# Patient Record
Sex: Male | Born: 1950 | Hispanic: No | Marital: Single | Smoking: Former smoker
Health system: Southern US, Community
[De-identification: ages and names within clinical notes are randomized; demographics above are authoritative.]

## PROBLEM LIST (undated history)

## (undated) DIAGNOSIS — E785 Hyperlipidemia, unspecified: Secondary | ICD-10-CM

## (undated) DIAGNOSIS — N200 Calculus of kidney: Secondary | ICD-10-CM

## (undated) DIAGNOSIS — K7460 Unspecified cirrhosis of liver: Secondary | ICD-10-CM

## (undated) DIAGNOSIS — H269 Unspecified cataract: Secondary | ICD-10-CM

## (undated) DIAGNOSIS — R000 Tachycardia, unspecified: Secondary | ICD-10-CM

## (undated) DIAGNOSIS — R9431 Abnormal electrocardiogram [ECG] [EKG]: Secondary | ICD-10-CM

## (undated) DIAGNOSIS — B1910 Unspecified viral hepatitis B without hepatic coma: Secondary | ICD-10-CM

## (undated) DIAGNOSIS — R42 Dizziness and giddiness: Secondary | ICD-10-CM

## (undated) DIAGNOSIS — E119 Type 2 diabetes mellitus without complications: Secondary | ICD-10-CM

## (undated) DIAGNOSIS — F419 Anxiety disorder, unspecified: Secondary | ICD-10-CM

## (undated) DIAGNOSIS — R079 Chest pain, unspecified: Secondary | ICD-10-CM

## (undated) DIAGNOSIS — G709 Myoneural disorder, unspecified: Secondary | ICD-10-CM

## (undated) DIAGNOSIS — F32A Depression, unspecified: Secondary | ICD-10-CM

## (undated) DIAGNOSIS — I10 Essential (primary) hypertension: Secondary | ICD-10-CM

## (undated) HISTORY — DX: Anxiety disorder, unspecified: F41.9

## (undated) HISTORY — DX: Hyperlipidemia, unspecified: E78.5

## (undated) HISTORY — PX: CYSTOSCOPY: SUR368

## (undated) HISTORY — DX: Tachycardia, unspecified: R00.0

## (undated) HISTORY — DX: Chest pain, unspecified: R07.9

## (undated) HISTORY — DX: Essential (primary) hypertension: I10

## (undated) HISTORY — PX: CYSTOSCOPY W/ URETERAL STENT PLACEMENT: SHX1429

## (undated) HISTORY — PX: HERNIA REPAIR: SHX51

## (undated) HISTORY — DX: Type 2 diabetes mellitus without complications: E11.9

## (undated) HISTORY — DX: Unspecified cataract: H26.9

## (undated) HISTORY — DX: Dizziness and giddiness: R42

## (undated) HISTORY — PX: FRACTURE SURGERY: SHX138

## (undated) HISTORY — DX: Calculus of kidney: N20.0

## (undated) HISTORY — DX: Myoneural disorder, unspecified: G70.9

## (undated) HISTORY — DX: Abnormal electrocardiogram (ECG) (EKG): R94.31

## (undated) HISTORY — DX: Depression, unspecified: F32.A

---

## 1998-12-18 ENCOUNTER — Encounter: Payer: Self-pay | Admitting: Emergency Medicine

## 1998-12-18 ENCOUNTER — Emergency Department (HOSPITAL_COMMUNITY): Admission: EM | Admit: 1998-12-18 | Discharge: 1998-12-18 | Payer: Self-pay | Admitting: *Deleted

## 2003-11-01 ENCOUNTER — Emergency Department (HOSPITAL_COMMUNITY): Admission: EM | Admit: 2003-11-01 | Discharge: 2003-11-01 | Payer: Self-pay | Admitting: Emergency Medicine

## 2003-11-09 ENCOUNTER — Ambulatory Visit (HOSPITAL_COMMUNITY): Admission: RE | Admit: 2003-11-09 | Discharge: 2003-11-09 | Payer: Self-pay | Admitting: Urology

## 2009-01-04 ENCOUNTER — Emergency Department (HOSPITAL_COMMUNITY): Admission: EM | Admit: 2009-01-04 | Discharge: 2009-01-04 | Payer: Self-pay | Admitting: Emergency Medicine

## 2009-10-27 ENCOUNTER — Emergency Department (HOSPITAL_COMMUNITY): Admission: EM | Admit: 2009-10-27 | Discharge: 2009-10-27 | Payer: Self-pay | Admitting: Emergency Medicine

## 2009-11-28 ENCOUNTER — Emergency Department (HOSPITAL_COMMUNITY): Admission: EM | Admit: 2009-11-28 | Discharge: 2009-11-28 | Payer: Self-pay | Admitting: Emergency Medicine

## 2010-03-27 LAB — GLUCOSE, CAPILLARY: Glucose-Capillary: 100 mg/dL — ABNORMAL HIGH (ref 70–99)

## 2010-03-29 LAB — URINALYSIS, ROUTINE W REFLEX MICROSCOPIC
Bilirubin Urine: NEGATIVE
Nitrite: NEGATIVE
Specific Gravity, Urine: 1.023 (ref 1.005–1.030)
Urobilinogen, UA: 1 mg/dL (ref 0.0–1.0)
pH: 7 (ref 5.0–8.0)

## 2010-04-16 LAB — RAPID URINE DRUG SCREEN, HOSP PERFORMED
Amphetamines: NOT DETECTED
Benzodiazepines: NOT DETECTED
Tetrahydrocannabinol: NOT DETECTED

## 2010-04-16 LAB — CBC
Hemoglobin: 16.3 g/dL (ref 13.0–17.0)
MCHC: 34.7 g/dL (ref 30.0–36.0)
RBC: 5.16 MIL/uL (ref 4.22–5.81)
WBC: 6.7 10*3/uL (ref 4.0–10.5)

## 2010-04-16 LAB — COMPREHENSIVE METABOLIC PANEL
ALT: 50 U/L (ref 0–53)
Alkaline Phosphatase: 52 U/L (ref 39–117)
Chloride: 105 mEq/L (ref 96–112)
Glucose, Bld: 111 mg/dL — ABNORMAL HIGH (ref 70–99)
Potassium: 3.5 mEq/L (ref 3.5–5.1)
Sodium: 140 mEq/L (ref 135–145)
Total Bilirubin: 1.3 mg/dL — ABNORMAL HIGH (ref 0.3–1.2)
Total Protein: 7.5 g/dL (ref 6.0–8.3)

## 2010-04-16 LAB — DIFFERENTIAL
Basophils Relative: 2 % — ABNORMAL HIGH (ref 0–1)
Eosinophils Absolute: 0 10*3/uL (ref 0.0–0.7)
Eosinophils Relative: 0 % (ref 0–5)
Lymphs Abs: 1.6 10*3/uL (ref 0.7–4.0)
Monocytes Relative: 7 % (ref 3–12)

## 2010-04-16 LAB — URINALYSIS, ROUTINE W REFLEX MICROSCOPIC
Bilirubin Urine: NEGATIVE
Glucose, UA: NEGATIVE mg/dL
pH: 7 (ref 5.0–8.0)

## 2010-06-04 ENCOUNTER — Encounter: Payer: Self-pay | Admitting: Cardiology

## 2010-06-05 ENCOUNTER — Encounter: Payer: Self-pay | Admitting: Cardiology

## 2010-06-05 ENCOUNTER — Ambulatory Visit (INDEPENDENT_AMBULATORY_CARE_PROVIDER_SITE_OTHER): Payer: Self-pay | Admitting: Cardiology

## 2010-06-05 DIAGNOSIS — F17200 Nicotine dependence, unspecified, uncomplicated: Secondary | ICD-10-CM

## 2010-06-05 DIAGNOSIS — R0989 Other specified symptoms and signs involving the circulatory and respiratory systems: Secondary | ICD-10-CM

## 2010-06-05 DIAGNOSIS — R000 Tachycardia, unspecified: Secondary | ICD-10-CM

## 2010-06-05 DIAGNOSIS — I10 Essential (primary) hypertension: Secondary | ICD-10-CM

## 2010-06-05 DIAGNOSIS — M79609 Pain in unspecified limb: Secondary | ICD-10-CM

## 2010-06-05 DIAGNOSIS — R0609 Other forms of dyspnea: Secondary | ICD-10-CM

## 2010-06-05 DIAGNOSIS — M79606 Pain in leg, unspecified: Secondary | ICD-10-CM

## 2010-06-05 MED ORDER — LOSARTAN POTASSIUM 50 MG PO TABS: 50.0000 mg | ORAL_TABLET | Freq: Every day | ORAL | Status: DC

## 2010-06-05 NOTE — Patient Instructions (Addendum)
Stop Lisinopril.  Start Losartan 50mg  twice a day.  Start Aspirin 81mg  daily. This should be enteric coated.  Schedule an appointment for a stress myoview. See instruction sheet.  Schedule an appointment for fasting lab in 1 week--lipid profile/liver profile/BMP 786.09  [phone]  Schedule an appointment to see Dr Shirlee Latch in 4 weeks    .

## 2010-06-06 DIAGNOSIS — IMO0001 Reserved for inherently not codable concepts without codable children: Secondary | ICD-10-CM | POA: Insufficient documentation

## 2010-06-06 DIAGNOSIS — R0609 Other forms of dyspnea: Secondary | ICD-10-CM | POA: Insufficient documentation

## 2010-06-06 DIAGNOSIS — F17200 Nicotine dependence, unspecified, uncomplicated: Secondary | ICD-10-CM | POA: Insufficient documentation

## 2010-06-06 DIAGNOSIS — R000 Tachycardia, unspecified: Secondary | ICD-10-CM | POA: Insufficient documentation

## 2010-06-06 NOTE — Assessment & Plan Note (Signed)
I strongly encouraged the patient to stop smoking.  

## 2010-06-06 NOTE — Assessment & Plan Note (Signed)
Resting HR 90s-100s, sinus.  Amitriptyline can cause tachycardia.  I think that this was started for treatment of presumed neuropathy.  I would consider using a different medication for neuropathy such as gabapentin which would be less likely to promote tachycardia.

## 2010-06-06 NOTE — Assessment & Plan Note (Addendum)
Patient's main complaint is exertional dyspnea that has been new over 7-8 months.  He does have risk factors for CAD: age, smoking, HTN.  I think CAD needs to be ruled out: will get ETT-myoview.  He should also start ASA 81 mg daily.  If the myoview is unrevealing, would consider checking PFTs given smoking history (?COPD).  I will also check lipids.

## 2010-06-06 NOTE — Assessment & Plan Note (Signed)
Good pedal pulses, doubt claudication.  Patient has history of peripheral neuropathy which might be the problem.

## 2010-06-06 NOTE — Progress Notes (Signed)
PCP: Dr. Andrey Campanile  60 yo with history of HTN presents for cardiology evaluation.  Patient feels fatigued in general.  He is short of breath and tired after walking 400-500 yards on flat ground.  This has been going on for 7-8 months.  He is also short of breath climbing a flight of steps and has to stop 1/2 way up to rest.  No chest pain/pressure/tightness.  He feels like his heart beats too fast at times.  Patient also reports lightheadedness if he stands too fast. He was not significantly orthostatic today when checked in the office.  He has had a chronic cough since starting lisinopril.  Finally, he gets some pain in his calves after walking 4-5 blocks.    ECG: NSR, nonspecific inferior T wave flattening  Labs (12/11): K 4, creatinine 0.78, HCT 45.8  PMH: 1. HTN: cough with ACEI 2. Nephrolithiasis 3. ? Peripheral neuropathy  SH: Lives in [place], originally from [place].  Now unemployed, had an Hydrologist prior.  Smokes 1 cigarette/day.  FH: No coronary artery disease.   ROS: All systems reviewed and negative except as per HPI.   Current Outpatient Prescriptions  Medication Sig Dispense Refill  . amitriptyline (ELAVIL) 25 MG tablet Take 25 mg by mouth at bedtime.        Marland Kitchen amLODipine (NORVASC) 5 MG tablet Take 5 mg by mouth daily.        . DULoxetine (CYMBALTA) 30 MG capsule Take 30 mg by mouth daily.        Marland Kitchen ibuprofen (ADVIL,MOTRIN) 200 MG tablet Take 200 mg by mouth every 6 (six) hours as needed.        . Multiple Vitamin (MULTIVITAMIN) capsule Take 1 capsule by mouth daily.        Marland Kitchen aspirin EC 81 MG tablet Take 1 daily      . losartan (COZAAR) 50 MG tablet Take 1 tablet (50 mg total) by mouth daily.  60 tablet  6    BP 140/99  Pulse 102  Resp 18  Ht 5\' 9"  (1.753 m)  Wt 189 lb (85.73 kg)  BMI 27.91 kg/m2 General: NAD Neck: No JVD, no thyromegaly or thyroid nodule.  Lungs: Clear to auscultation bilaterally with normal respiratory effort. CV: Nondisplaced PMI.   Heart regular S1/S2, no S3, +S4, no murmur.  No peripheral edema.  No carotid bruit.  Normal pedal pulses.  Abdomen: Soft, nontender, no hepatosplenomegaly, no distention.  Skin: Intact without lesions or rashes.  Neurologic: Alert and oriented x 3.  Psych: Normal affect. Extremities: No clubbing or cyanosis.  HEENT: Normal.

## 2010-06-06 NOTE — Assessment & Plan Note (Signed)
BP is a bit high today.  He is not orthostatic. If BP is high at next appointment, I will increase amlodipine.

## 2010-06-14 ENCOUNTER — Other Ambulatory Visit (INDEPENDENT_AMBULATORY_CARE_PROVIDER_SITE_OTHER): Payer: Self-pay | Admitting: *Deleted

## 2010-06-14 ENCOUNTER — Ambulatory Visit (HOSPITAL_COMMUNITY): Payer: Self-pay | Attending: Cardiology | Admitting: Radiology

## 2010-06-14 DIAGNOSIS — R0989 Other specified symptoms and signs involving the circulatory and respiratory systems: Secondary | ICD-10-CM

## 2010-06-14 DIAGNOSIS — I10 Essential (primary) hypertension: Secondary | ICD-10-CM

## 2010-06-14 DIAGNOSIS — R0609 Other forms of dyspnea: Secondary | ICD-10-CM

## 2010-06-14 DIAGNOSIS — R079 Chest pain, unspecified: Secondary | ICD-10-CM

## 2010-06-14 LAB — HEPATIC FUNCTION PANEL
ALT: 59 U/L — ABNORMAL HIGH (ref 0–53)
AST: 47 U/L — ABNORMAL HIGH (ref 0–37)
Albumin: 4.3 g/dL (ref 3.5–5.2)
Total Bilirubin: 0.7 mg/dL (ref 0.3–1.2)

## 2010-06-14 LAB — LIPID PANEL
Cholesterol: 173 mg/dL (ref 0–200)
HDL: 44.5 mg/dL (ref >39.00)
Triglycerides: 67 mg/dL (ref 0.0–149.0)

## 2010-06-14 LAB — BASIC METABOLIC PANEL
Calcium: 9.6 mg/dL (ref 8.4–10.5)
GFR: 107.93 mL/min (ref >60.00)
Sodium: 145 mEq/L (ref 135–145)

## 2010-06-14 MED ORDER — TECHNETIUM TC 99M TETROFOSMIN IV KIT
10.7000 | PACK | Freq: Once | INTRAVENOUS | Status: AC | PRN
  Administered 2010-06-14: 11 via INTRAVENOUS

## 2010-06-14 MED ORDER — TECHNETIUM TC 99M TETROFOSMIN IV KIT
33.0000 | PACK | Freq: Once | INTRAVENOUS | Status: AC | PRN
  Administered 2010-06-14: 33 via INTRAVENOUS

## 2010-06-14 NOTE — Progress Notes (Addendum)
[facility] SITE 3 NUCLEAR MED [address] [place] [address] [phone]  Cardiology Nuclear Med Study  Phillip Paul is a 60 y.o. male [id number] [birth year]   Nuclear Med Background Indication for Stress Test:  Evaluation for Ischemia History:  No previous documented CAD Cardiac Risk Factors: Hypertension and Smoker  Symptoms:  Chest Pain (last date of chest discomfort 1 week ago), Dizziness, DOE, Fatigue, Fatigue with Exertion and Rapid HR   Nuclear Pre-Procedure Caffeine/Decaff Intake:  7:00pm NPO After: 7:00pm   Lungs:  Clear IV 0.9% NS with Angio Cath:  22g  IV Site: L Antecubital  IV Started by:  Irean Hong, RN  Chest Size (in):  42 Cup Size: n/a  Height: 5\' 9"  (1.753 m)  Weight:  184 lb (83.462 kg)  BMI:  Body mass index is 27.17 kg/(m^2). Tech Comments:  N/A    Nuclear Med Study 1 or 2 day study: 1 day  Stress Test Type:  Stress  Reading MD: Olga Millers, MD  Order Authorizing Provider:  Dr. Golden Circle  Resting Radionuclide: Technetium 51m Tetrofosmin  Resting Radionuclide Dose: 10.7 mCi   Stress Radionuclide:  Technetium 41m Tetrofosmin  Stress Radionuclide Dose: 33 mCi           Stress Protocol Rest HR: 91 Stress HR: 144  Rest BP: 114/83 Stress BP: 126/80  Exercise Time (min): 9:38 METS: 10.1   Predicted Max HR: 161 bpm % Max HR: 89.44 bpm Rate Pressure Product: 89211   Dose of Adenosine (mg):  n/a Dose of Lexiscan: n/a mg  Dose of Atropine (mg): n/a Dose of Dobutamine: n/a mcg/kg/min (at max HR)  Stress Test Technologist: Bonnita Levan, RN  Nuclear Technologist:  Domenic Polite, CNMT     Rest Procedure:  Myocardial perfusion imaging was performed at rest 45 minutes following the intravenous administration of Technetium 50m Tetrofosmin. Rest ECG: NSR  Stress Procedure:  The patient exercised for 9:38.  The patient stopped due to fatigue and dyspnea and denied any chest pain.  There were no significant ST-T wave  changes.  Technetium 32m Tetrofosmin was injected at peak exercise and myocardial perfusion imaging was performed after a brief delay. Stress ECG: No significant ST segment change suggestive of ischemia.  QPS Raw Data Images:  Acquisition technically good; normal left ventricular size. Stress Images:  There is decreased uptake in the inferior wall. Rest Images:  There is decreased uptake in the inferior wall, less prominent compared to the stress images. Subtraction (SDS):  These findings are consistent with inferior thinning vs prior infarct and mild inferior ischemia. Transient Ischemic Dilatation (Normal <1.22):  1.0 Lung/Heart Ratio (Normal <0.45):  .26  Quantitative Gated Spect Images QGS EDV:  80 ml QGS ESV:  28 ml QGS cine images:  Normal Wall Motion QGS EF: 65%  Impression Exercise Capacity:  Good exercise capacity. BP Response:  Normal blood pressure response. Clinical Symptoms:  No chest pain. ECG Impression:  No significant ST segment change suggestive of ischemia. Comparison with Prior Nuclear Study: No images to compare  Overall Impression:  Abnormal stress nuclear study with a moderate, partially reversible inferior defect c/w inferior thinning vs prior infarct; mild ischemia.   Olga Millers   Possible inferior ischemia.  Needs office followup.  [name]

## 2010-06-15 ENCOUNTER — Telehealth: Payer: Self-pay | Admitting: *Deleted

## 2010-06-15 NOTE — Telephone Encounter (Signed)
Called Phillip Paul to give results stress test but due to his language barrier he asked me to call a nurse friend "dottie' and give her results so she can explain to him--i tried to get dottie, but no answer--LMTCB with our #--nt

## 2010-06-15 NOTE — Progress Notes (Signed)
Copy routed to Dr.Mclean.Falecha L Clark ° °

## 2010-06-18 ENCOUNTER — Encounter: Payer: Self-pay | Admitting: Cardiology

## 2010-06-18 NOTE — Progress Notes (Signed)
Discussed with pt. He is aware he should keep appt scheduled with Dr Shirlee Latch 07/05/10.

## 2010-06-18 NOTE — Progress Notes (Signed)
appt 07/05/10/ with Dr Shirlee Latch

## 2010-07-05 ENCOUNTER — Ambulatory Visit (INDEPENDENT_AMBULATORY_CARE_PROVIDER_SITE_OTHER): Payer: Self-pay | Admitting: Cardiology

## 2010-07-05 ENCOUNTER — Encounter: Payer: Self-pay | Admitting: *Deleted

## 2010-07-05 ENCOUNTER — Encounter: Payer: Self-pay | Admitting: Cardiology

## 2010-07-05 VITALS — BP 116/78 | HR 84 | Ht 69.0 in | Wt 183.2 lb

## 2010-07-05 DIAGNOSIS — R9430 Abnormal result of cardiovascular function study, unspecified: Secondary | ICD-10-CM

## 2010-07-05 DIAGNOSIS — R0602 Shortness of breath: Secondary | ICD-10-CM

## 2010-07-05 DIAGNOSIS — F17200 Nicotine dependence, unspecified, uncomplicated: Secondary | ICD-10-CM

## 2010-07-05 DIAGNOSIS — R0609 Other forms of dyspnea: Secondary | ICD-10-CM

## 2010-07-05 DIAGNOSIS — I10 Essential (primary) hypertension: Secondary | ICD-10-CM

## 2010-07-05 DIAGNOSIS — R0989 Other specified symptoms and signs involving the circulatory and respiratory systems: Secondary | ICD-10-CM

## 2010-07-05 LAB — BASIC METABOLIC PANEL
BUN: 16 mg/dL (ref 6–23)
Chloride: 104 mEq/L (ref 96–112)
Creatinine, Ser: 0.7 mg/dL (ref 0.4–1.5)

## 2010-07-05 LAB — CBC WITH DIFFERENTIAL/PLATELET
Basophils Absolute: 0 10*3/uL (ref 0.0–0.1)
Eosinophils Absolute: 0.1 10*3/uL (ref 0.0–0.7)
Eosinophils Relative: 1.9 % (ref 0.0–5.0)
MCV: 93 fl (ref 78.0–100.0)
Monocytes Absolute: 0.4 10*3/uL (ref 0.1–1.0)
Neutrophils Relative %: 55.1 % (ref 43.0–77.0)
Platelets: 251 10*3/uL (ref 150.0–400.0)
WBC: 5 10*3/uL (ref 4.5–10.5)

## 2010-07-05 LAB — PROTIME-INR: Prothrombin Time: 11.6 s (ref 10.2–12.4)

## 2010-07-05 MED ORDER — LOSARTAN POTASSIUM 50 MG PO TABS: 50.0000 mg | ORAL_TABLET | Freq: Two times a day (BID) | ORAL | Status: DC

## 2010-07-05 NOTE — Assessment & Plan Note (Signed)
Abnormal myoview suggestive of possible inferior ischemia.  He continues to have exertional dyspnea.  Given his abnormal myoview and risk factors, I think that the next step will be cardiac catheterization.  I talked to him about the 01/998 risk of serious side effects.  We will plan on cath using radial access next week.  He will continue ASA 81 mg daily.  If cath is ok (suggesting false positive myoview perhaps from diaphragmatic attenuation), I will arrange for PFTs as COPD could certainly explain his dyspnea.

## 2010-07-05 NOTE — Progress Notes (Signed)
PCP: Dr. Andrey Campanile  60 yo with history of HTN presented initially for cardiology evaluation.  Patient has been feeling fatigued in general.  He is short of breath and tired after walking 400-500 yards on flat ground.  This has been going on for 7-8 months.  He is also short of breath climbing a flight of steps and has to stop 1/2 way up to rest.  No chest pain/pressure/tightness.  He feels like his heart beats too fast at times.  Patient also reports lightheadedness if he stands too fast. Chronic cough has resolved with switch from lisinopril to losartan.  Since last appointment, he has quit smoking.  I had him get an ETT-myoview given exertional symptoms and risk factors.  He had good exercise tolerance (9'38") but perfusion images showed a moderate, partially reversible inferior defect that could be consistent with ischemia.    Labs (12/11): K 4, creatinine 0.78, HCT 45.8 Labs (5/12): K 4.4, creatinine 0.8, LDL 115, HDL 44.5, AST 47, ALT 59  PMH: 1. HTN: cough with ACEI 2. Nephrolithiasis 3. ? Peripheral neuropathy 4. Exertional dyspnea: ETT-myoview (5/12) with 9/38" exercise, no ischemic ECG changes, moderate partially reversible inferior defect suggestive of possible ischemia with EF 65%.   SH: Lives in [place], originally from [place].  Now unemployed, had an Hydrologist prior.  Smokes 1 cigarette/day.  FH: No coronary artery disease.   ROS: All systems reviewed and negative except as per HPI.   Current Outpatient Prescriptions  Medication Sig Dispense Refill  . amitriptyline (ELAVIL) 25 MG tablet Take 25 mg by mouth at bedtime.        Marland Kitchen amLODipine (NORVASC) 5 MG tablet Take 5 mg by mouth daily.        Marland Kitchen aspirin EC 81 MG tablet Take 1 daily      . DULoxetine (CYMBALTA) 30 MG capsule Take 30 mg by mouth daily.        Marland Kitchen ibuprofen (ADVIL,MOTRIN) 200 MG tablet Take 200 mg by mouth every 6 (six) hours as needed.        Marland Kitchen losartan (COZAAR) 50 MG tablet Take 1 tablet (50 mg total) by  mouth 2 (two) times daily.  60 tablet  6  . Multiple Vitamin (MULTIVITAMIN) capsule Take 1 capsule by mouth daily.        Marland Kitchen DISCONTD: losartan (COZAAR) 50 MG tablet Take 1 tablet (50 mg total) by mouth daily.  60 tablet  6  . DISCONTD: losartan (COZAAR) 50 MG tablet Take 50 mg by mouth 2 (two) times daily.          BP 116/78  Pulse 84  Ht 5\' 9"  (1.753 m)  Wt 183 lb 4 oz (83.122 kg)  BMI 27.06 kg/m2 General: NAD Neck: No JVD, no thyromegaly or thyroid nodule.  Lungs: Clear to auscultation bilaterally with normal respiratory effort. CV: Nondisplaced PMI.  Heart regular S1/S2, no S3, +S4, no murmur.  No peripheral edema.  No carotid bruit.  Normal pedal pulses.  Abdomen: Soft, nontender, no hepatosplenomegaly, no distention.  Skin: Intact without lesions or rashes.  Neurologic: Alert and oriented x 3.  Psych: Normal affect. Extremities: No clubbing or cyanosis.  HEENT: Normal.

## 2010-07-05 NOTE — Assessment & Plan Note (Signed)
Patient has quit smoking altogether.  I encouraged him to stay off cigarettes.

## 2010-07-05 NOTE — Assessment & Plan Note (Signed)
BP at goal.  Cough resolved off lisinopril.  Check BMET on losartan.

## 2010-07-05 NOTE — Patient Instructions (Signed)
Lab today---BMP/CBC/PT  794.30  786.05  Cardiac Catheterization is scheduled for July 12, 2010. See instruction sheet.  Schedule an appointment with Dr Shirlee Latch in about 3 weeks.

## 2010-07-12 ENCOUNTER — Inpatient Hospital Stay (HOSPITAL_BASED_OUTPATIENT_CLINIC_OR_DEPARTMENT_OTHER)
Admission: RE | Admit: 2010-07-12 | Discharge: 2010-07-12 | Disposition: A | Discharge status: Hospice | Payer: Self-pay | Source: Ambulatory Visit | Attending: Cardiology | Admitting: Cardiology

## 2010-07-12 DIAGNOSIS — R9439 Abnormal result of other cardiovascular function study: Secondary | ICD-10-CM | POA: Insufficient documentation

## 2010-07-12 DIAGNOSIS — R079 Chest pain, unspecified: Secondary | ICD-10-CM | POA: Insufficient documentation

## 2010-07-12 DIAGNOSIS — I2510 Atherosclerotic heart disease of native coronary artery without angina pectoris: Secondary | ICD-10-CM

## 2010-07-26 ENCOUNTER — Encounter: Payer: Self-pay | Admitting: Cardiology

## 2010-07-26 ENCOUNTER — Ambulatory Visit (INDEPENDENT_AMBULATORY_CARE_PROVIDER_SITE_OTHER): Payer: Self-pay | Admitting: Cardiology

## 2010-07-26 DIAGNOSIS — R0609 Other forms of dyspnea: Secondary | ICD-10-CM

## 2010-07-26 DIAGNOSIS — E7800 Pure hypercholesterolemia, unspecified: Secondary | ICD-10-CM

## 2010-07-26 DIAGNOSIS — F17200 Nicotine dependence, unspecified, uncomplicated: Secondary | ICD-10-CM

## 2010-07-26 DIAGNOSIS — E785 Hyperlipidemia, unspecified: Secondary | ICD-10-CM

## 2010-07-26 DIAGNOSIS — I10 Essential (primary) hypertension: Secondary | ICD-10-CM

## 2010-07-26 MED ORDER — PRAVASTATIN SODIUM 20 MG PO TABS: 20.0000 mg | ORAL_TABLET | Freq: Every evening | ORAL | Status: DC

## 2010-07-26 NOTE — Patient Instructions (Signed)
Start Pravachol 20mg  daily in the evening.  Schedule an appointment for a pulmonary function test.  Schedule an appointment for fasting lab in 2 months--lipid profile/liver profile  786.09  272.0  You do not need to schedule a follow-up appt with Dr Shirlee Latch.

## 2010-07-27 DIAGNOSIS — E785 Hyperlipidemia, unspecified: Secondary | ICD-10-CM | POA: Insufficient documentation

## 2010-07-27 NOTE — Assessment & Plan Note (Signed)
Left heart cath showed nonobstructive CAD.  Patient still has exertional dyspnea.  It certainly may be related to smoking (COPD).  I will have him get PFTs.  He needs to stay off cigarettes.

## 2010-07-27 NOTE — Assessment & Plan Note (Signed)
Still abstinent from cigarettes.

## 2010-07-27 NOTE — Progress Notes (Signed)
PCP: Dr. Andrey Campanile  60 yo with history of HTN presented initially for cardiology evaluation.  Patient has been feeling fatigued in general.  He is short of breath and tired after walking 400-500 yards on flat ground.  This has been going on for 7-8 months.  He is also short of breath climbing a flight of steps and has to stop 1/2 way up to rest.  No chest pain/pressure/tightness.  He feels like his heart beats too fast at times.  Patient also reports lightheadedness if he stands too fast. Chronic cough has resolved with switch from lisinopril to losartan.  He quit smoking and remains off cigarettes today.   I had him get an ETT-myoview given exertional symptoms and risk factors.  He had good exercise tolerance (9'38") but perfusion images showed a moderate, partially reversible inferior defect that could be consistent with ischemia.  Left heart cath was then done, showing 30% proximal RCA stenosis and 30% mid CFX stenosis with normal EF.    Labs (12/11): K 4, creatinine 0.78, HCT 45.8 Labs (5/12): K 4.4, creatinine 0.8, LDL 115, HDL 44.5, AST 47, ALT 59  PMH: 1. HTN: cough with ACEI 2. Nephrolithiasis 3. ? Peripheral neuropathy 4. Exertional dyspnea: ETT-myoview (5/12) with 9/38" exercise, no ischemic ECG changes, moderate partially reversible inferior defect suggestive of possible ischemia with EF 65%.  LHC (6/12): 30% proximal RCA stenosis, 30% mid CFX, EF 60%.   SH: Lives in [place], originally from [place].  Now unemployed, had an Hydrologist prior.  Quit smoking 6/12.   FH: No coronary artery disease.   ROS: All systems reviewed and negative except as per HPI.   Current Outpatient Prescriptions  Medication Sig Dispense Refill  . amitriptyline (ELAVIL) 25 MG tablet Take 25 mg by mouth at bedtime.        Marland Kitchen amLODipine (NORVASC) 5 MG tablet Take 5 mg by mouth daily.        Marland Kitchen aspirin EC 81 MG tablet Take 1 daily      . DULoxetine (CYMBALTA) 30 MG capsule Take 30 mg by mouth daily.         Marland Kitchen ibuprofen (ADVIL,MOTRIN) 200 MG tablet Take 200 mg by mouth every 6 (six) hours as needed.        Marland Kitchen losartan (COZAAR) 50 MG tablet Take 1 tablet (50 mg total) by mouth 2 (two) times daily.  60 tablet  6  . Multiple Vitamin (MULTIVITAMIN) capsule Take 1 capsule by mouth daily.        . pravastatin (PRAVACHOL) 20 MG tablet Take 1 tablet (20 mg total) by mouth every evening.  30 tablet  3    BP 120/78  Pulse 87  Ht 5\' 10"  (1.778 m)  Wt 184 lb (83.462 kg)  BMI 26.40 kg/m2 General: NAD Neck: No JVD, no thyromegaly or thyroid nodule.  Lungs: Clear to auscultation bilaterally with normal respiratory effort. CV: Nondisplaced PMI.  Heart regular S1/S2, no S3, +S4, no murmur.  No peripheral edema.  No carotid bruit.  Normal pedal pulses.  Abdomen: Soft, nontender, no hepatosplenomegaly, no distention.  Neurologic: Alert and oriented x 3.  Psych: Normal affect. Extremities: No clubbing or cyanosis.

## 2010-07-27 NOTE — Assessment & Plan Note (Signed)
Given mild coronary disease, I am going to start him on a low dose of statin (pravastatin 20 mg daily) to hopefully get LDL at least < 100.  Will check lipids/LFTs in 2 months.

## 2010-08-02 NOTE — Cardiovascular Report (Signed)
  Phillip Paul, VANALSTYNE NO.:  [id number]  MEDICAL RECORD NO.:  [id number]  LOCATION:                                 FACILITY:  PHYSICIAN:  Marca Ancona, MD      DATE OF BIRTH:  [birth year]  DATE OF PROCEDURE:  07/12/2010 DATE OF DISCHARGE:                           CARDIAC CATHETERIZATION   PROCEDURES: 1. Left heart catheterization. 2. Coronary angiography 3. Left ventriculography.  INDICATIONS:  This is a 60 year old who has had episodes of chest pain, sometimes with exertion, and had an abnormal Myoview scan.  PROCEDURE NOTE:  After informed consent was obtained, the patient underwent Allen testing on his right wrist.  The Allen test was positive, signifying good collateral circulation from the ulnar artery to the radial side of the hand.  The right radial area was sterilely prepped and draped.  A 1% Lidocaine was used to locally anesthetize the right radial area.  The right renal artery was entered using modified Seldinger technique and a 5-French arterial sheath was placed.  The patient received 3 mg of intraarterial verapamil and 4000 units of IV heparin.  The right coronary was engaged using the JR-4 catheter and the left coronary artery was engaged using the JL-3.0 guide catheter.  The left ventricle was entered using angled pigtail catheter.  There were no complications.  FINDINGS: 1. Hemodynamics:  Aorta 96/64, LV 104/8. 2. Left ventriculography:  EF was estimated to be 60% with no wall     motion abnormalities in the RAO projection. 3. Right coronary artery:  The right coronary artery was dominant     vessel with 30% proximal stenosis and 30% midvessel stenosis. 4. Left main.  Left main had no significant disease. 5. Left circumflex.  Left circumflex had a 30% midvessel stenosis. 6. LAD.  The LAD system had only luminal irregularities.  IMPRESSION:  Nonobstructive coronary artery disease.  The patient's Myoview is likely a false positive.   His chest pain I suspect is noncardiac.  He does need to quit smoking.    Marca Ancona, MD    DM/MEDQ  D:  07/12/2010  T:  07/12/2010  Job:  [id number]  cc:   Georganna Skeans, MD  Electronically Signed by Marca Ancona MD on 08/02/2010 01:32:10 PM

## 2010-08-08 ENCOUNTER — Ambulatory Visit (INDEPENDENT_AMBULATORY_CARE_PROVIDER_SITE_OTHER): Payer: Self-pay | Admitting: Internal Medicine

## 2010-08-08 DIAGNOSIS — R0609 Other forms of dyspnea: Secondary | ICD-10-CM

## 2010-08-08 DIAGNOSIS — R0989 Other specified symptoms and signs involving the circulatory and respiratory systems: Secondary | ICD-10-CM

## 2010-08-08 LAB — PULMONARY FUNCTION TEST

## 2010-08-08 NOTE — Progress Notes (Signed)
PFT done today. 

## 2010-08-21 ENCOUNTER — Encounter: Payer: Self-pay | Admitting: Cardiology

## 2010-08-21 ENCOUNTER — Telehealth: Payer: Self-pay | Admitting: *Deleted

## 2010-08-21 NOTE — Telephone Encounter (Signed)
Dr Shirlee Latch reviewed PFT done 08/08/10. Normal: no evidence for COPD. Pt is aware of the results

## 2010-09-24 ENCOUNTER — Inpatient Hospital Stay (INDEPENDENT_AMBULATORY_CARE_PROVIDER_SITE_OTHER)
Admission: RE | Admit: 2010-09-24 | Discharge: 2010-09-24 | Disposition: A | Discharge status: Home / Self Care | Payer: Self-pay | Source: Ambulatory Visit | Attending: Family Medicine | Admitting: Family Medicine

## 2010-09-24 ENCOUNTER — Other Ambulatory Visit (INDEPENDENT_AMBULATORY_CARE_PROVIDER_SITE_OTHER): Payer: Self-pay | Admitting: *Deleted

## 2010-09-24 DIAGNOSIS — R42 Dizziness and giddiness: Secondary | ICD-10-CM

## 2010-09-24 DIAGNOSIS — E7800 Pure hypercholesterolemia, unspecified: Secondary | ICD-10-CM

## 2010-09-25 LAB — LIPID PANEL
HDL: 39.5 mg/dL (ref >39.00)
Total CHOL/HDL Ratio: 4
Triglycerides: 116 mg/dL (ref 0.0–149.0)
VLDL: 23.2 mg/dL (ref 0.0–40.0)

## 2010-09-25 LAB — HEPATIC FUNCTION PANEL
ALT: 74 U/L — ABNORMAL HIGH (ref 0–53)
AST: 52 U/L — ABNORMAL HIGH (ref 0–37)
Albumin: 3.9 g/dL (ref 3.5–5.2)
Total Bilirubin: 0.7 mg/dL (ref 0.3–1.2)

## 2010-09-28 ENCOUNTER — Other Ambulatory Visit: Payer: Self-pay | Admitting: *Deleted

## 2010-12-20 ENCOUNTER — Other Ambulatory Visit (HOSPITAL_COMMUNITY): Payer: Self-pay | Admitting: Family Medicine

## 2010-12-20 DIAGNOSIS — R413 Other amnesia: Secondary | ICD-10-CM

## 2010-12-20 DIAGNOSIS — R42 Dizziness and giddiness: Secondary | ICD-10-CM

## 2010-12-25 ENCOUNTER — Ambulatory Visit (HOSPITAL_COMMUNITY)
Admission: RE | Admit: 2010-12-25 | Discharge: 2010-12-25 | Disposition: A | Discharge status: Home / Self Care | Payer: Self-pay | Source: Ambulatory Visit | Attending: Family Medicine | Admitting: Family Medicine

## 2010-12-25 DIAGNOSIS — R42 Dizziness and giddiness: Secondary | ICD-10-CM | POA: Insufficient documentation

## 2010-12-25 DIAGNOSIS — R413 Other amnesia: Secondary | ICD-10-CM

## 2010-12-25 DIAGNOSIS — R51 Headache: Secondary | ICD-10-CM | POA: Insufficient documentation

## 2010-12-25 DIAGNOSIS — I679 Cerebrovascular disease, unspecified: Secondary | ICD-10-CM | POA: Insufficient documentation

## 2010-12-25 DIAGNOSIS — I10 Essential (primary) hypertension: Secondary | ICD-10-CM | POA: Insufficient documentation

## 2010-12-26 ENCOUNTER — Ambulatory Visit (HOSPITAL_COMMUNITY)
Admission: RE | Admit: 2010-12-26 | Discharge: 2010-12-26 | Disposition: A | Discharge status: Home / Self Care | Payer: Self-pay | Source: Ambulatory Visit | Attending: Family Medicine | Admitting: Family Medicine

## 2010-12-26 DIAGNOSIS — R42 Dizziness and giddiness: Secondary | ICD-10-CM | POA: Insufficient documentation

## 2010-12-26 DIAGNOSIS — R55 Syncope and collapse: Secondary | ICD-10-CM

## 2010-12-26 NOTE — Progress Notes (Signed)
*  PRELIMINARY RESULTS*  Carotid Doppler has been performed. Bilateral:  No evidence of hemodynamically significant internal carotid artery stenosis.  Vertebral artery flow is antegrade.      Farrel Demark 12/26/2010, 10:22 AM

## 2011-01-24 ENCOUNTER — Encounter: Payer: Self-pay | Admitting: Cardiology

## 2011-01-24 ENCOUNTER — Ambulatory Visit (INDEPENDENT_AMBULATORY_CARE_PROVIDER_SITE_OTHER): Payer: Self-pay | Admitting: Cardiology

## 2011-01-24 ENCOUNTER — Encounter (INDEPENDENT_AMBULATORY_CARE_PROVIDER_SITE_OTHER): Payer: Self-pay

## 2011-01-24 VITALS — BP 118/70 | HR 92 | Ht 70.0 in | Wt 183.0 lb

## 2011-01-24 DIAGNOSIS — R000 Tachycardia, unspecified: Secondary | ICD-10-CM

## 2011-01-24 DIAGNOSIS — R42 Dizziness and giddiness: Secondary | ICD-10-CM

## 2011-01-24 DIAGNOSIS — I10 Essential (primary) hypertension: Secondary | ICD-10-CM

## 2011-01-24 NOTE — Assessment & Plan Note (Signed)
BP is controlled on current regimen.  

## 2011-01-24 NOTE — Assessment & Plan Note (Signed)
Heart rate is noted to be mildly elevated occasionally.  I suspect mild sinus tachycardia.  I will get a holter monitor for 48 hours to rule out any more significant arrhythmias.

## 2011-01-24 NOTE — Patient Instructions (Signed)
Your physician recommends that you schedule a follow-up appointment as needed.  Your physician has recommended that you wear a 48 HOUR holter monitor. Holter monitors are medical devices that record the heart's electrical activity. Doctors most often use these monitors to diagnose arrhythmias. Arrhythmias are problems with the speed or rhythm of the heartbeat. The monitor is a small, portable device. You can wear one while you do your normal daily activities. This is usually used to diagnose what is causing palpitations/syncope (passing out).

## 2011-01-24 NOTE — Assessment & Plan Note (Signed)
Patient's dizziness is more of a spinning sensation than lightheadedness.  He is not orthostatic.  I suspect he has positional vertigo.  Would consider referral to ENT for evaluation/treatment.

## 2011-01-24 NOTE — Progress Notes (Signed)
PCP: [facility]  60 yo with history of HTN returns for cardiology evaluation. He had a cath with nonobstructive CAD in 6/12.  He had PFTs in 7/12 that were within normal limits.  He has stopped smoking.    He has had long-term problems with dizziness.  He has undergone a rather extensive workup for this recently with an unremarkable head CT and carotid dopplers.  He notes dizziness with positional changes such as standing from sitting or turning his head to one side.  The dizziness is more of a spinning sensation, not lightheadedness.  Also, he has noted his pulse rate going high at times, up to the 130s.  He brings a heart rate and BP log today.  Most of the time, BP is well controlled.  HR is mainly in the 80s-100s.  Today, HR is 90 (sinus rhythm).  He walks 3-4 miles a day.  He is fatigued after walking about 4 blocks but keeps going.    He is off statin now because LFTs elevated.   We checked orthostatics today.  He is not orthostatic.   ECG: NSR, normal with HR 90  Labs (12/11): K 4, creatinine 0.78, HCT 45.8 Labs (5/12): K 4.4, creatinine 0.8, LDL 115, HDL 44.5, AST 47, ALT 59 Labs (9/12): LDL 102, HDL 39.5, AST 52, ALT 74  PMH: 1. HTN: cough with ACEI 2. Nephrolithiasis 3. ? Peripheral neuropathy 4. Exertional dyspnea: ETT-myoview (5/12) with 9/38" exercise, no ischemic ECG changes, moderate partially reversible inferior defect suggestive of possible ischemia with EF 65%.  LHC (6/12): 30% proximal RCA stenosis, 30% mid CFX, EF 60%. PFTs (7/12): within normal limits.  5. "Dizziness": Sounds like vertigo. Had head CT in 12/12 and carotid dopplers in 12/12.  Both were unremarkable.    SH: Lives in [place], originally from [place].  Now unemployed, had an Hydrologist prior.  Quit smoking 6/12.   FH: No coronary artery disease.   ROS: All systems reviewed and negative except as per HPI.   Current Outpatient Prescriptions  Medication Sig Dispense Refill  . amitriptyline  (ELAVIL) 25 MG tablet Take 25 mg by mouth at bedtime.        Marland Kitchen amLODipine (NORVASC) 10 MG tablet Take 10 mg by mouth daily.      Marland Kitchen aspirin EC 81 MG tablet Take 1 daily      . DULoxetine (CYMBALTA) 30 MG capsule Take 30 mg by mouth daily.        . fish oil-omega-3 fatty acids 1000 MG capsule Take 1 g by mouth daily.      Marland Kitchen ibuprofen (ADVIL,MOTRIN) 200 MG tablet Take 200 mg by mouth every 6 (six) hours as needed.        Marland Kitchen losartan (COZAAR) 50 MG tablet Take 50 mg by mouth daily.      . Multiple Vitamin (MULTIVITAMIN) capsule Take 1 capsule by mouth daily.        Marland Kitchen DISCONTD: losartan (COZAAR) 50 MG tablet Take 1 tablet (50 mg total) by mouth 2 (two) times daily.  60 tablet  6  . DISCONTD: pravastatin (PRAVACHOL) 20 MG tablet Take 1 tablet (20 mg total) by mouth every evening.  30 tablet  3    BP 136/88  Pulse 90  Ht 5\' 10"  (1.778 m)  Wt 83.008 kg (183 lb)  BMI 26.26 kg/m2 General: NAD Neck: No JVD, no thyromegaly or thyroid nodule.  Lungs: Clear to auscultation bilaterally with normal respiratory effort. CV: Nondisplaced PMI.  Heart  regular S1/S2, no S3, +S4, no murmur.  No peripheral edema.  No carotid bruit.  Normal pedal pulses.  Abdomen: Soft, nontender, no hepatosplenomegaly, no distention.  Neurologic: Alert and oriented x 3.  Psych: Normal affect. Extremities: No clubbing or cyanosis.

## 2011-02-12 ENCOUNTER — Telehealth: Payer: Self-pay | Admitting: *Deleted

## 2011-02-12 NOTE — Telephone Encounter (Signed)
02/12/11--Dr Shirlee Latch reviewed monitor done 01/24/11 avg HR 89. NSR rare PACs, PVCs. Discussed findings with nurse that works with pt.

## 2012-08-17 ENCOUNTER — Emergency Department (HOSPITAL_COMMUNITY): Payer: No Typology Code available for payment source

## 2012-08-17 ENCOUNTER — Encounter (HOSPITAL_COMMUNITY): Payer: Self-pay | Admitting: Anesthesiology

## 2012-08-17 ENCOUNTER — Encounter (HOSPITAL_COMMUNITY): Payer: Self-pay | Admitting: Emergency Medicine

## 2012-08-17 ENCOUNTER — Inpatient Hospital Stay: Admit: 2012-08-17 | Payer: Self-pay | Admitting: Orthopedic Surgery

## 2012-08-17 ENCOUNTER — Inpatient Hospital Stay (HOSPITAL_COMMUNITY): Payer: No Typology Code available for payment source

## 2012-08-17 ENCOUNTER — Encounter (HOSPITAL_COMMUNITY)
Admission: EM | Disposition: A | Discharge status: Home / Self Care | Payer: Self-pay | Source: Home / Self Care | Attending: Family Medicine

## 2012-08-17 ENCOUNTER — Inpatient Hospital Stay (HOSPITAL_COMMUNITY)
Admission: EM | Admit: 2012-08-17 | Discharge: 2012-08-18 | DRG: 493 | Disposition: A | Discharge status: Home / Self Care | Payer: No Typology Code available for payment source | Attending: Family Medicine | Admitting: Family Medicine

## 2012-08-17 ENCOUNTER — Inpatient Hospital Stay (HOSPITAL_COMMUNITY): Payer: No Typology Code available for payment source | Admitting: Anesthesiology

## 2012-08-17 DIAGNOSIS — J40 Bronchitis, not specified as acute or chronic: Secondary | ICD-10-CM

## 2012-08-17 DIAGNOSIS — S82899A Other fracture of unspecified lower leg, initial encounter for closed fracture: Principal | ICD-10-CM

## 2012-08-17 DIAGNOSIS — S82832A Other fracture of upper and lower end of left fibula, initial encounter for closed fracture: Secondary | ICD-10-CM

## 2012-08-17 DIAGNOSIS — F17200 Nicotine dependence, unspecified, uncomplicated: Secondary | ICD-10-CM

## 2012-08-17 DIAGNOSIS — S82302A Unspecified fracture of lower end of left tibia, initial encounter for closed fracture: Secondary | ICD-10-CM

## 2012-08-17 DIAGNOSIS — E785 Hyperlipidemia, unspecified: Secondary | ICD-10-CM | POA: Diagnosis present

## 2012-08-17 DIAGNOSIS — S82839A Other fracture of upper and lower end of unspecified fibula, initial encounter for closed fracture: Secondary | ICD-10-CM

## 2012-08-17 DIAGNOSIS — S92213A Displaced fracture of cuboid bone of unspecified foot, initial encounter for closed fracture: Secondary | ICD-10-CM | POA: Diagnosis present

## 2012-08-17 DIAGNOSIS — S82209A Unspecified fracture of shaft of unspecified tibia, initial encounter for closed fracture: Secondary | ICD-10-CM | POA: Diagnosis present

## 2012-08-17 DIAGNOSIS — I2510 Atherosclerotic heart disease of native coronary artery without angina pectoris: Secondary | ICD-10-CM | POA: Diagnosis present

## 2012-08-17 DIAGNOSIS — S92212A Displaced fracture of cuboid bone of left foot, initial encounter for closed fracture: Secondary | ICD-10-CM

## 2012-08-17 DIAGNOSIS — R0902 Hypoxemia: Secondary | ICD-10-CM | POA: Diagnosis present

## 2012-08-17 DIAGNOSIS — S92309A Fracture of unspecified metatarsal bone(s), unspecified foot, initial encounter for closed fracture: Secondary | ICD-10-CM | POA: Diagnosis present

## 2012-08-17 DIAGNOSIS — S82202A Unspecified fracture of shaft of left tibia, initial encounter for closed fracture: Secondary | ICD-10-CM

## 2012-08-17 DIAGNOSIS — S82402A Unspecified fracture of shaft of left fibula, initial encounter for closed fracture: Secondary | ICD-10-CM

## 2012-08-17 DIAGNOSIS — I10 Essential (primary) hypertension: Secondary | ICD-10-CM | POA: Diagnosis present

## 2012-08-17 DIAGNOSIS — J209 Acute bronchitis, unspecified: Secondary | ICD-10-CM | POA: Diagnosis present

## 2012-08-17 HISTORY — PX: ORIF ANKLE FRACTURE: SHX5408

## 2012-08-17 HISTORY — PX: TIBIA IM NAIL INSERTION: SHX2516

## 2012-08-17 LAB — CBC WITH DIFFERENTIAL/PLATELET
Basophils Absolute: 0 10*3/uL (ref 0.0–0.1)
Basophils Relative: 0 % (ref 0–1)
Eosinophils Absolute: 0.1 10*3/uL (ref 0.0–0.7)
Eosinophils Relative: 1 % (ref 0–5)
HCT: 45.1 % (ref 39.0–52.0)
Hemoglobin: 15.6 g/dL (ref 13.0–17.0)
Lymphocytes Relative: 28 % (ref 12–46)
Lymphs Abs: 2.8 K/uL (ref 0.7–4.0)
MCH: 31 pg (ref 26.0–34.0)
MCHC: 34.6 g/dL (ref 30.0–36.0)
MCV: 89.5 fL (ref 78.0–100.0)
Monocytes Absolute: 1.5 10*3/uL — ABNORMAL HIGH (ref 0.1–1.0)
Monocytes Relative: 15 % — ABNORMAL HIGH (ref 3–12)
Neutro Abs: 5.6 10*3/uL (ref 1.7–7.7)
Neutrophils Relative %: 56 % (ref 43–77)
Platelets: 214 K/uL (ref 150–400)
RBC: 5.04 MIL/uL (ref 4.22–5.81)
RDW: 14.5 % (ref 11.5–15.5)
WBC: 10 K/uL (ref 4.0–10.5)

## 2012-08-17 LAB — BASIC METABOLIC PANEL WITH GFR
CO2: 24 meq/L (ref 19–32)
Calcium: 9.2 mg/dL (ref 8.4–10.5)
Glucose, Bld: 102 mg/dL — ABNORMAL HIGH (ref 70–99)
Potassium: 4.2 meq/L (ref 3.5–5.1)
Sodium: 134 meq/L — ABNORMAL LOW (ref 135–145)

## 2012-08-17 LAB — RAPID URINE DRUG SCREEN, HOSP PERFORMED
Barbiturates: NOT DETECTED
Opiates: POSITIVE — AB
Tetrahydrocannabinol: NOT DETECTED

## 2012-08-17 LAB — PROTIME-INR
INR: 1.02 (ref 0.00–1.49)
Prothrombin Time: 13.2 s (ref 11.6–15.2)

## 2012-08-17 LAB — BASIC METABOLIC PANEL
BUN: 10 mg/dL (ref 6–23)
Chloride: 97 mEq/L (ref 96–112)
Creatinine, Ser: 0.62 mg/dL (ref 0.50–1.35)
GFR calc Af Amer: >90 mL/min (ref >90)
GFR calc non Af Amer: >90 mL/min (ref >90)

## 2012-08-17 LAB — APTT: aPTT: 31 seconds (ref 24–37)

## 2012-08-17 SURGERY — INSERTION, INTRAMEDULLARY ROD, TIBIA
Anesthesia: General | Site: Leg Lower | Laterality: Left | Wound class: Clean

## 2012-08-17 MED ORDER — HYDROMORPHONE HCL PF 1 MG/ML IJ SOLN
INTRAMUSCULAR | Status: AC
  Filled 2012-08-17: qty 1

## 2012-08-17 MED ORDER — ALBUTEROL SULFATE (5 MG/ML) 0.5% IN NEBU
2.5000 mg | INHALATION_SOLUTION | RESPIRATORY_TRACT | Status: DC
  Administered 2012-08-18 (×4): 2.5 mg via RESPIRATORY_TRACT
  Filled 2012-08-17 (×4): qty 0.5

## 2012-08-17 MED ORDER — LACTATED RINGERS IV SOLN
INTRAVENOUS | Status: DC | PRN
  Administered 2012-08-17 (×2): via INTRAVENOUS

## 2012-08-17 MED ORDER — ENOXAPARIN SODIUM 40 MG/0.4ML ~~LOC~~ SOLN
40.0000 mg | SUBCUTANEOUS | Status: DC
  Administered 2012-08-18: 40 mg via SUBCUTANEOUS
  Filled 2012-08-17 (×2): qty 0.4

## 2012-08-17 MED ORDER — HYDROMORPHONE HCL PF 1 MG/ML IJ SOLN
0.2500 mg | INTRAMUSCULAR | Status: DC | PRN
  Administered 2012-08-17 (×3): 0.5 mg via INTRAVENOUS

## 2012-08-17 MED ORDER — FENTANYL CITRATE 0.05 MG/ML IJ SOLN
50.0000 ug | INTRAMUSCULAR | Status: DC | PRN
  Administered 2012-08-17: 50 ug via INTRAVENOUS
  Filled 2012-08-17: qty 2

## 2012-08-17 MED ORDER — HYDROMORPHONE HCL PF 1 MG/ML IJ SOLN
INTRAMUSCULAR | Status: DC | PRN
  Administered 2012-08-17 (×2): 0.5 mg via INTRAVENOUS

## 2012-08-17 MED ORDER — ALBUTEROL SULFATE (5 MG/ML) 0.5% IN NEBU
5.0000 mg | INHALATION_SOLUTION | Freq: Once | RESPIRATORY_TRACT | Status: AC
  Administered 2012-08-17: 5 mg via RESPIRATORY_TRACT
  Filled 2012-08-17: qty 1

## 2012-08-17 MED ORDER — SODIUM CHLORIDE 0.9 % IJ SOLN
3.0000 mL | Freq: Two times a day (BID) | INTRAMUSCULAR | Status: DC
  Administered 2012-08-18: 3 mL via INTRAVENOUS

## 2012-08-17 MED ORDER — ONDANSETRON HCL 4 MG PO TABS: 4.0000 mg | ORAL_TABLET | Freq: Four times a day (QID) | ORAL | Status: DC | PRN

## 2012-08-17 MED ORDER — OXYCODONE HCL 5 MG PO TABS: 5.0000 mg | ORAL_TABLET | Freq: Once | ORAL | Status: AC | PRN

## 2012-08-17 MED ORDER — HEPARIN SODIUM (PORCINE) 5000 UNIT/ML IJ SOLN
5000.0000 [IU] | Freq: Three times a day (TID) | INTRAMUSCULAR | Status: DC
  Administered 2012-08-17: 5000 [IU] via SUBCUTANEOUS
  Filled 2012-08-17 (×3): qty 1

## 2012-08-17 MED ORDER — BUPIVACAINE HCL (PF) 0.25 % IJ SOLN
INTRAMUSCULAR | Status: AC
  Filled 2012-08-17: qty 30

## 2012-08-17 MED ORDER — LEVOFLOXACIN IN D5W 500 MG/100ML IV SOLN
500.0000 mg | INTRAVENOUS | Status: DC
  Administered 2012-08-17: 500 mg via INTRAVENOUS
  Filled 2012-08-17 (×2): qty 100

## 2012-08-17 MED ORDER — FENTANYL CITRATE 0.05 MG/ML IJ SOLN
50.0000 ug | INTRAMUSCULAR | Status: AC | PRN
  Administered 2012-08-17 (×3): 50 ug via INTRAVENOUS
  Filled 2012-08-17 (×3): qty 2

## 2012-08-17 MED ORDER — ONDANSETRON HCL 4 MG/2ML IJ SOLN
INTRAMUSCULAR | Status: DC | PRN
  Administered 2012-08-17: 4 mg via INTRAVENOUS

## 2012-08-17 MED ORDER — SODIUM CHLORIDE 0.9 % IV SOLN
Freq: Once | INTRAVENOUS | Status: AC
  Administered 2012-08-17: 15:00:00 via INTRAVENOUS

## 2012-08-17 MED ORDER — ROCURONIUM BROMIDE 100 MG/10ML IV SOLN
INTRAVENOUS | Status: DC | PRN
  Administered 2012-08-17: 50 mg via INTRAVENOUS
  Administered 2012-08-17: 20 mg via INTRAVENOUS

## 2012-08-17 MED ORDER — AMLODIPINE BESYLATE 5 MG PO TABS
5.0000 mg | ORAL_TABLET | Freq: Every day | ORAL | Status: DC
  Administered 2012-08-18: 5 mg via ORAL
  Filled 2012-08-17: qty 1

## 2012-08-17 MED ORDER — OXYCODONE HCL 5 MG/5ML PO SOLN: 5.0000 mg | Freq: Once | ORAL | Status: AC | PRN

## 2012-08-17 MED ORDER — DEXTROSE 5 % IV SOLN: 3.0000 g | INTRAVENOUS | Status: DC

## 2012-08-17 MED ORDER — SODIUM CHLORIDE 0.9 % IV SOLN: INTRAVENOUS | Status: DC

## 2012-08-17 MED ORDER — PROPOFOL 10 MG/ML IV BOLUS
INTRAVENOUS | Status: DC | PRN
  Administered 2012-08-17: 200 mg via INTRAVENOUS

## 2012-08-17 MED ORDER — CEFAZOLIN SODIUM 1-5 GM-% IV SOLN
INTRAVENOUS | Status: AC
  Filled 2012-08-17: qty 100

## 2012-08-17 MED ORDER — LOSARTAN POTASSIUM 50 MG PO TABS
50.0000 mg | ORAL_TABLET | Freq: Every day | ORAL | Status: DC
  Administered 2012-08-18: 50 mg via ORAL
  Filled 2012-08-17: qty 1

## 2012-08-17 MED ORDER — CHLORHEXIDINE GLUCONATE 4 % EX LIQD: 60.0000 mL | Freq: Once | CUTANEOUS | Status: DC

## 2012-08-17 MED ORDER — NEOSTIGMINE METHYLSULFATE 1 MG/ML IJ SOLN
INTRAMUSCULAR | Status: DC | PRN
  Administered 2012-08-17: 3 mg via INTRAVENOUS

## 2012-08-17 MED ORDER — OXYCODONE HCL 5 MG PO TABS
ORAL_TABLET | ORAL | Status: AC
  Administered 2012-08-17: 5 mg via ORAL
  Filled 2012-08-17: qty 1

## 2012-08-17 MED ORDER — BACITRACIN ZINC 500 UNIT/GM EX OINT
TOPICAL_OINTMENT | CUTANEOUS | Status: DC | PRN
  Administered 2012-08-17: 1 via TOPICAL

## 2012-08-17 MED ORDER — FENTANYL CITRATE 0.05 MG/ML IJ SOLN: 25.0000 ug | INTRAMUSCULAR | Status: DC | PRN

## 2012-08-17 MED ORDER — ONDANSETRON HCL 4 MG/2ML IJ SOLN: 4.0000 mg | Freq: Four times a day (QID) | INTRAMUSCULAR | Status: DC | PRN

## 2012-08-17 MED ORDER — HYDROMORPHONE HCL PF 1 MG/ML IJ SOLN
1.0000 mg | INTRAMUSCULAR | Status: DC | PRN
  Administered 2012-08-17 – 2012-08-18 (×8): 1 mg via INTRAVENOUS
  Filled 2012-08-17 (×8): qty 1

## 2012-08-17 MED ORDER — IPRATROPIUM BROMIDE 0.02 % IN SOLN
0.5000 mg | RESPIRATORY_TRACT | Status: DC
  Administered 2012-08-18 (×4): 0.5 mg via RESPIRATORY_TRACT
  Filled 2012-08-17 (×4): qty 2.5

## 2012-08-17 MED ORDER — FENTANYL CITRATE 0.05 MG/ML IJ SOLN
INTRAMUSCULAR | Status: DC | PRN
  Administered 2012-08-17: 100 ug via INTRAVENOUS
  Administered 2012-08-17: 50 ug via INTRAVENOUS
  Administered 2012-08-17 (×2): 100 ug via INTRAVENOUS
  Administered 2012-08-17 (×3): 50 ug via INTRAVENOUS
  Administered 2012-08-17: 100 ug via INTRAVENOUS
  Administered 2012-08-17: 50 ug via INTRAVENOUS
  Administered 2012-08-17: 100 ug via INTRAVENOUS

## 2012-08-17 MED ORDER — OXYCODONE HCL 5 MG PO TABS
5.0000 mg | ORAL_TABLET | ORAL | Status: DC | PRN
  Administered 2012-08-18: 10 mg via ORAL
  Filled 2012-08-17: qty 2

## 2012-08-17 MED ORDER — GLYCOPYRROLATE 0.2 MG/ML IJ SOLN
INTRAMUSCULAR | Status: DC | PRN
  Administered 2012-08-17: 0.4 mg via INTRAVENOUS

## 2012-08-17 MED ORDER — BACITRACIN ZINC 500 UNIT/GM EX OINT
TOPICAL_OINTMENT | CUTANEOUS | Status: AC
  Filled 2012-08-17: qty 30

## 2012-08-17 MED ORDER — CEFAZOLIN SODIUM 1-5 GM-% IV SOLN
INTRAVENOUS | Status: DC | PRN
  Administered 2012-08-17: 2 g via INTRAVENOUS

## 2012-08-17 MED ORDER — METHYLPREDNISOLONE SODIUM SUCC 125 MG IJ SOLR
125.0000 mg | Freq: Once | INTRAMUSCULAR | Status: AC
  Administered 2012-08-17: 125 mg via INTRAVENOUS
  Filled 2012-08-17: qty 2

## 2012-08-17 MED ORDER — 0.9 % SODIUM CHLORIDE (POUR BTL) OPTIME
TOPICAL | Status: DC | PRN
  Administered 2012-08-17: 1000 mL

## 2012-08-17 MED ORDER — SODIUM CHLORIDE 0.9 % IV SOLN
Freq: Once | INTRAVENOUS | Status: AC
  Administered 2012-08-17: 11:00:00 via INTRAVENOUS

## 2012-08-17 MED ORDER — LIDOCAINE HCL (CARDIAC) 20 MG/ML IV SOLN
INTRAVENOUS | Status: DC | PRN
  Administered 2012-08-17: 100 mg via INTRAVENOUS

## 2012-08-17 SURGICAL SUPPLY — 77 items
BANDAGE ELASTIC 4 VELCRO ST LF (GAUZE/BANDAGES/DRESSINGS) ×3 IMPLANT
BANDAGE ELASTIC 6 VELCRO ST LF (GAUZE/BANDAGES/DRESSINGS) ×3 IMPLANT
BANDAGE ESMARK 6X9 LF (GAUZE/BANDAGES/DRESSINGS) ×2 IMPLANT
BIT DRILL 2.9 CANN QC NONSTRL (BIT) ×3 IMPLANT
BIT DRILL 3.8X6 NS (BIT) ×3 IMPLANT
BIT DRILL 4.4 NS (BIT) ×3 IMPLANT
BLADE SURG 15 STRL LF DISP TIS (BLADE) ×2 IMPLANT
BLADE SURG 15 STRL SS (BLADE) ×3
BNDG CMPR 9X6 STRL LF SNTH (GAUZE/BANDAGES/DRESSINGS) ×2
BNDG COHESIVE 4X5 TAN STRL (GAUZE/BANDAGES/DRESSINGS) ×3 IMPLANT
BNDG COHESIVE 6X5 TAN STRL LF (GAUZE/BANDAGES/DRESSINGS) ×3 IMPLANT
BNDG ESMARK 6X9 LF (GAUZE/BANDAGES/DRESSINGS) ×3
CHLORAPREP W/TINT 26ML (MISCELLANEOUS) ×6 IMPLANT
CLOTH BEACON ORANGE TIMEOUT ST (SAFETY) ×3 IMPLANT
COVER SURGICAL LIGHT HANDLE (MISCELLANEOUS) ×6 IMPLANT
CUFF TOURNIQUET SINGLE 34IN LL (TOURNIQUET CUFF) ×3 IMPLANT
CUFF TOURNIQUET SINGLE 44IN (TOURNIQUET CUFF) IMPLANT
DRAPE C-ARM 42X72 X-RAY (DRAPES) IMPLANT
DRAPE C-ARMOR (DRAPES) ×3 IMPLANT
DRAPE INCISE IOBAN 66X45 STRL (DRAPES) ×3 IMPLANT
DRAPE ORTHO SPLIT 77X108 STRL (DRAPES) ×6
DRAPE SURG ORHT 6 SPLT 77X108 (DRAPES) ×4 IMPLANT
DRAPE U-SHAPE 47X51 STRL (DRAPES) ×9 IMPLANT
DRSG ADAPTIC 3X8 NADH LF (GAUZE/BANDAGES/DRESSINGS) ×3 IMPLANT
DRSG MEPILEX BORDER 4X4 (GAUZE/BANDAGES/DRESSINGS) ×3 IMPLANT
DRSG PAD ABDOMINAL 8X10 ST (GAUZE/BANDAGES/DRESSINGS) ×6 IMPLANT
ELECT REM PT RETURN 9FT ADLT (ELECTROSURGICAL) ×3
ELECTRODE REM PT RTRN 9FT ADLT (ELECTROSURGICAL) ×2 IMPLANT
EVACUATOR 1/8 PVC DRAIN (DRAIN) IMPLANT
GLOVE BIO SURGEON STRL SZ8 (GLOVE) ×9 IMPLANT
GLOVE BIOGEL PI IND STRL 6.5 (GLOVE) ×4 IMPLANT
GLOVE BIOGEL PI IND STRL 8 (GLOVE) ×2 IMPLANT
GLOVE BIOGEL PI INDICATOR 6.5 (GLOVE) ×2
GLOVE BIOGEL PI INDICATOR 8 (GLOVE) ×1
GOWN PREVENTION PLUS XLARGE (GOWN DISPOSABLE) ×3 IMPLANT
GOWN STRL NON-REIN LRG LVL3 (GOWN DISPOSABLE) ×9 IMPLANT
K-WIRE ACE 1.6X6 (WIRE) ×3
KIT BASIN OR (CUSTOM PROCEDURE TRAY) ×3 IMPLANT
KIT ROOM TURNOVER OR (KITS) ×3 IMPLANT
KWIRE ACE 1.6X6 (WIRE) ×2 IMPLANT
MANIFOLD NEPTUNE II (INSTRUMENTS) ×3 IMPLANT
NAIL TIBIAL 9MMX36CM (Nail) ×3 IMPLANT
NEEDLE 22X1 1/2 (OR ONLY) (NEEDLE) IMPLANT
NS IRRIG 1000ML POUR BTL (IV SOLUTION) ×3 IMPLANT
PACK ORTHO EXTREMITY (CUSTOM PROCEDURE TRAY) ×3 IMPLANT
PAD ARMBOARD 7.5X6 YLW CONV (MISCELLANEOUS) ×6 IMPLANT
PAD CAST 4YDX4 CTTN HI CHSV (CAST SUPPLIES) ×2 IMPLANT
PADDING CAST COTTON 4X4 STRL (CAST SUPPLIES) ×2
PADDING CAST COTTON 6X4 STRL (CAST SUPPLIES) ×3 IMPLANT
PIN GUIDE ACE (PIN) ×3 IMPLANT
SCREW ACE CAN 4.0 34M (Screw) ×3 IMPLANT
SCREW ACE CAN 4.0 46M (Screw) ×3 IMPLANT
SCREW ACECAP 36MM (Screw) ×3 IMPLANT
SCREW ACECAP 50MM (Screw) ×3 IMPLANT
SCREW PROXIMAL DEPUY (Screw) ×3 IMPLANT
SCREW PRXML FT 60X5.5XNS LF (Screw) ×2 IMPLANT
SPLINT PLASTER CAST XFAST 5X30 (CAST SUPPLIES) ×2 IMPLANT
SPLINT PLASTER XFAST SET 5X30 (CAST SUPPLIES) ×1
SPONGE GAUZE 4X4 12PLY (GAUZE/BANDAGES/DRESSINGS) ×3 IMPLANT
SPONGE LAP 18X18 X RAY DECT (DISPOSABLE) ×3 IMPLANT
STAPLER VISISTAT 35W (STAPLE) ×3 IMPLANT
SUCTION FRAZIER TIP 10 FR DISP (SUCTIONS) ×3 IMPLANT
SUT MNCRL AB 3-0 PS2 18 (SUTURE) IMPLANT
SUT PROLENE 3 0 PS 2 (SUTURE) ×6 IMPLANT
SUT VIC AB 0 CT1 27 (SUTURE)
SUT VIC AB 0 CT1 27XBRD ANBCTR (SUTURE) IMPLANT
SUT VIC AB 2-0 CT1 27 (SUTURE) ×6
SUT VIC AB 2-0 CT1 TAPERPNT 27 (SUTURE) ×4 IMPLANT
SUT VIC AB 3-0 PS2 18 (SUTURE) ×2
SUT VIC AB 3-0 PS2 18XBRD (SUTURE) ×2 IMPLANT
SYR CONTROL 10ML LL (SYRINGE) IMPLANT
TOWEL OR 17X24 6PK STRL BLUE (TOWEL DISPOSABLE) ×3 IMPLANT
TOWEL OR 17X26 10 PK STRL BLUE (TOWEL DISPOSABLE) ×3 IMPLANT
TUBE CONNECTING 12X1/4 (SUCTIONS) ×3 IMPLANT
UNDERPAD 30X30 INCONTINENT (UNDERPADS AND DIAPERS) ×3 IMPLANT
WATER STERILE IRR 1000ML POUR (IV SOLUTION) ×3 IMPLANT
YANKAUER SUCT BULB TIP NO VENT (SUCTIONS) ×3 IMPLANT

## 2012-08-17 NOTE — ED Notes (Signed)
Pulses obtained using doppler pt continues to be able to move foot. Moderate swelling.

## 2012-08-17 NOTE — ED Notes (Signed)
Patient transported to X-ray 

## 2012-08-17 NOTE — ED Notes (Signed)
Ortho paged. 

## 2012-08-17 NOTE — ED Notes (Signed)
Phillip Paul ( Pt friend). Contact for pick-up or when admitted. Contact number cell [phone].
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
## 2012-08-17 NOTE — ED Notes (Signed)
[place] [employer] at bedside 
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
## 2012-08-17 NOTE — Progress Notes (Signed)
Orthopedic Tech Progress Note Patient Details:  Phillip Paul July 09, 1950 [id number] Applied combination posterior short leg and stirrup splint to LLE.  Pulses, motion, sensation, capillary refill intact before and after splinting.  Capillary refill less than 2 seconds. Ortho Devices Type of Ortho Device: Post (short leg) splint;Sugartong splint Ortho Device/Splint Location: LLE Ortho Device/Splint Interventions: Application   Lesle Chris 08/17/2012, 2:33 PM

## 2012-08-17 NOTE — Anesthesia Postprocedure Evaluation (Signed)
  Anesthesia Post-op Note  Patient: Phillip Paul  Procedure(s) Performed: Procedure(s): INTRAMEDULLARY (IM) NAIL TIBIAL/Left (Left) OPEN REDUCTION INTERNAL FIXATION (ORIF) ANKLE FRACTURE/Left (Left)  Patient Location: PACU  Anesthesia Type:General  Level of Consciousness: awake, alert , oriented and patient cooperative  Airway and Oxygen Therapy: Patient Spontanous Breathing and Patient connected to nasal cannula oxygen  Post-op Pain: mild  Post-op Assessment: Post-op Vital signs reviewed, Patient's Cardiovascular Status Stable, Respiratory Function Stable, Patent Airway, No signs of Nausea or vomiting and Pain level controlled  Post-op Vital Signs: Reviewed and stable  Complications: No apparent anesthesia complications

## 2012-08-17 NOTE — Brief Op Note (Signed)
08/17/2012  8:03 PM  PATIENT:  Phillip Paul  62 y.o. male  PRE-OPERATIVE DIAGNOSIS:  Left Tibial Fx, left tibial pilon fracture  POST-OPERATIVE DIAGNOSIS:  same  Procedure(s): 1.  ORIF left tibial pilon fracture 2.  Intramedullary nailing of left tibial shaft fracture  SURGEON:  Toni Arthurs, MD  ASSISTANT: n/a  ANESTHESIA:   General  EBL:  minimal   TOURNIQUET:  n/a  COMPLICATIONS:  None apparent  DISPOSITION:  Extubated, awake and stable to recovery.  DICTATION ID:  [id number]

## 2012-08-17 NOTE — H&P (Signed)
I examined this patient and discussed the care plan with Dr Funches and the FPTS team and agree with assessment and plan as documented in the progress note above.  

## 2012-08-17 NOTE — ED Notes (Signed)
X-ray at bedside

## 2012-08-17 NOTE — ED Notes (Signed)
MD at bedside. Dr wyatt 

## 2012-08-17 NOTE — Progress Notes (Signed)
PGY-2 Interim Note  Pt seen at bedside. Appreciate admission by Dr. Gwenlyn Saran. Pt with splint in place to left LE. Sleeping when I entered the room, snoring, easily rousable. Stated his pain was much better with pain medicine (Fentanyl), waiting on going to the OR. Pt reports his breathing is "okay" on oxygen and denies bad cough currently.  Appreciate orthopedics management. Will plan to re-evaluate pt once he comes back from the OR. Discussed with Dr. Armen Pickup, who decreased pt's Fentanyl dose to guard against oversedation.  Bobbye Morton, MD PGY-2, Orthopedics [facility] 08/17/2012, 5:24 PM FPTS Service pager: [phone] (text pages welcome through [facility])

## 2012-08-17 NOTE — Transfer of Care (Signed)
Immediate Anesthesia Transfer of Care Note  Patient: Phillip Paul  Procedure(s) Performed: Procedure(s): INTRAMEDULLARY (IM) NAIL TIBIAL/Left (Left) OPEN REDUCTION INTERNAL FIXATION (ORIF) ANKLE FRACTURE/Left (Left)  Patient Location: PACU  Anesthesia Type:General  Level of Consciousness: awake and alert   Airway & Oxygen Therapy: Patient Spontanous Breathing and Patient connected to nasal cannula oxygen  Post-op Assessment: Report given to PACU RN and Post -op Vital signs reviewed and stable  Post vital signs: Reviewed and stable  Complications: No apparent anesthesia complications

## 2012-08-17 NOTE — Anesthesia Preprocedure Evaluation (Addendum)
Anesthesia Evaluation  Patient identified by MRN, date of birth, ID band Patient awake    Reviewed: Allergy & Precautions, H&P , NPO status , Patient's Chart, lab work & pertinent test results  Airway Mallampati: II  Neck ROM: full    Dental   Pulmonary former smoker,          Cardiovascular hypertension,     Neuro/Psych    GI/Hepatic   Endo/Other    Renal/GU      Musculoskeletal   Abdominal   Peds  Hematology   Anesthesia Other Findings   Reproductive/Obstetrics                           Anesthesia Physical Anesthesia Plan  ASA: II  Anesthesia Plan: General   Post-op Pain Management:    Induction: Intravenous  Airway Management Planned: Oral ETT  Additional Equipment:   Intra-op Plan:   Post-operative Plan: Extubation in OR  Informed Consent: I have reviewed the patients History and Physical, chart, labs and discussed the procedure including the risks, benefits and alternatives for the proposed anesthesia with the patient or authorized representative who has indicated his/her understanding and acceptance.     Plan Discussed with: CRNA, Anesthesiologist and Surgeon  Anesthesia Plan Comments:         Anesthesia Quick Evaluation  

## 2012-08-18 ENCOUNTER — Encounter (HOSPITAL_COMMUNITY): Payer: Self-pay | Admitting: Orthopedic Surgery

## 2012-08-18 DIAGNOSIS — F17200 Nicotine dependence, unspecified, uncomplicated: Secondary | ICD-10-CM

## 2012-08-18 DIAGNOSIS — J40 Bronchitis, not specified as acute or chronic: Secondary | ICD-10-CM

## 2012-08-18 LAB — BASIC METABOLIC PANEL
Chloride: 104 mEq/L (ref 96–112)
GFR calc Af Amer: >90 mL/min (ref >90)
Potassium: 4.1 mEq/L (ref 3.5–5.1)

## 2012-08-18 LAB — CBC
HCT: 39.1 % (ref 39.0–52.0)
Hemoglobin: 13.7 g/dL (ref 13.0–17.0)
RBC: 4.42 MIL/uL (ref 4.22–5.81)
WBC: 13 10*3/uL — ABNORMAL HIGH (ref 4.0–10.5)

## 2012-08-18 MED ORDER — IPRATROPIUM BROMIDE 0.02 % IN SOLN: 0.5000 mg | RESPIRATORY_TRACT | Status: DC | PRN

## 2012-08-18 MED ORDER — OXYCODONE HCL 5 MG PO TABS
5.0000 mg | ORAL_TABLET | Freq: Three times a day (TID) | ORAL | Status: DC
  Administered 2012-08-18: 10 mg via ORAL
  Filled 2012-08-18: qty 2

## 2012-08-18 MED ORDER — OXYCODONE-ACETAMINOPHEN 5-325 MG PO TABS: 1.0000 | ORAL_TABLET | ORAL | Status: DC | PRN

## 2012-08-18 MED ORDER — ALBUTEROL SULFATE (5 MG/ML) 0.5% IN NEBU: 2.5000 mg | INHALATION_SOLUTION | RESPIRATORY_TRACT | Status: DC | PRN

## 2012-08-18 MED ORDER — OXYCODONE HCL ER 20 MG PO T12A: 20.0000 mg | EXTENDED_RELEASE_TABLET | Freq: Two times a day (BID) | ORAL | Status: DC

## 2012-08-18 NOTE — Progress Notes (Signed)
Utilization review completed.  
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
## 2012-08-18 NOTE — Care Management Note (Addendum)
Page 1 of 2   08/19/2012     12:32:09 PM   CARE MANAGEMENT NOTE 08/19/2012  Patient:  Phillip Paul, Phillip Paul   Account Number:  [id number]  Date Initiated:  08/18/2012  Documentation initiated by:  Phillip Paul  Subjective/Objective Assessment:   Pt admitted with tib/fib fx s/p repair     Action/Plan:   PTA pt lived at home- PT/OT evals   Anticipated DC Date:  08/20/2012   Anticipated DC Plan:  HOME W HOME HEALTH SERVICES      DC Planning Services  CM consult      [facility] Choice  HOME HEALTH  DURABLE MEDICAL EQUIPMENT   Choice offered to / List presented to:  C-1 Patient        HH arranged  HH-2 PT  HH-3 OT      [facility] agency  [facility]   Status of service:  Completed, signed off Medicare Important Message given?  NA - LOS <3 / Initial given by admissions (If response is "NO", the following Medicare IM given date fields will be blank) Date Medicare IM given:   Date Additional Medicare IM given:    Discharge Disposition:  HOME W HOME HEALTH SERVICES  Per UR Regulation:  Reviewed for med. necessity/level of care/duration of stay  If discussed at Long Length of Stay Meetings, dates discussed:    Comments:  08/19/12- 1030- Phillip Pierini RN, BSN [phone] F/u done regarding d/c needs and HH- Called pt at home-spoke with Phillip Paul- who confirmed that pt had all needed DME at home including walker, BSC, shower chair- discussed that if any other DME needed she could go to a local DME store but it would be an out of pocket expense- explained that [facility] had been contacted this AM for [facility] services and that they should be hearing from [facility] within the next 24-48 hr. - also spoke with Phillip Paul with [facility] regarding referral for pt for [facility]- PT/OT services.  08/19/12 0930 Phillip Gallant, RN, CCM [phone]  Late entry for 08/18/12 1945 Receivd a call from [place] on 3W ([phone])  stating assist need with completion of home health services for pt.  Phillip Paul reviewed previous CM notes with on  call CM. CM spoke with pt via [phone] (his room telephone) at [phone]  Pt stated he was awaiting his friend "Phillip Paul" arrival for pick up and assistance with choice of home health agency. Pt initially stated he did not think he needed home health services  Cm reviewed with pt his options of having CM assist with ordered services or he could have pcp (per pt Phillip [place] in [place]) staff assist him once he decided if services are needed. Pt agreed to either return a call to CM on 8/5/4 or to have pcp assist him.  Pt's nurse, Phillip Paul returned a call to CM after "Phillip Paul" (who per pt is a Engineer, civil (consulting) and will be his primary care giver) arrived. CM informed of pt choice of home health agency as [facility]). Discussed that services would be called in on 08/19/12 since [facility] office closed at 1900. Pt voiced he had a safe d/c plan home with "Phillip Paul"  The pt voiced understanding that service would possible not begin until 08/19/12 or 08/20/12 and appreciation of services rendered 08/19/12 0950 Voice message left for Phillip Paul [facility] coordinator for referral for services Pending return call back [phone] 08/19/12 Spoke with Phillip Paul reviewed referral, updated her on pt d/c  plans and informed unit CM also contacted her this am for referral  08/18/12- 1215- Phillip Pierini RN, BSN [phone] Spoke with pt at bedside regarding d/c plans- per conversation pt states he lives at home with spouse who works during day- he is trying to find assistance at home for while she is at work when he is discharged- he would prefer to go home- is open to having HH services- list of HH agencies for [facility] given to pt- will f/u on arrangements closer to discharge.
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
## 2012-08-18 NOTE — Progress Notes (Signed)
Pt decided to use [facility] as [facility] PT and OT, Case manager will arrange( per telephone conversation with KIM case manager on call). Ok to discharge today.

## 2012-08-18 NOTE — Progress Notes (Signed)
Subjective: 1 Day Post-Op Procedure(s) (LRB): INTRAMEDULLARY (IM) NAIL TIBIAL/Left (Left) OPEN REDUCTION INTERNAL FIXATION (ORIF) ANKLE FRACTURE/Left (Left) Patient reports pain as mild.  No n/v.  Objective: Vital signs in last 24 hours: Temp:  [97.3 F (36.3 C)-98.4 F (36.9 C)] 98.1 F (36.7 C) (08/05 0800) Pulse Rate:  [76-117] 117 (08/05 0800) Resp:  [18-23] 18 (08/05 0800) BP: (120-151)/(71-97) 144/71 mmHg (08/05 0800) SpO2:  [91 %-99 %] 92 % (08/05 0823) Weight:  [90.583 kg (199 lb 11.2 oz)] 90.583 kg (199 lb 11.2 oz) (08/04 1347)  Intake/Output from previous day: 08/04 0701 - 08/05 0700 In: 1300 [I.V.:1300] Out: 375 [Urine:350; Blood:25] Intake/Output this shift: Total I/O In: 360 [P.O.:360] Out: 350 [Urine:350]   Recent Labs  08/17/12 0848 08/18/12 0600  HGB 15.6 13.7    Recent Labs  08/17/12 0848 08/18/12 0600  WBC 10.0 13.0*  RBC 5.04 4.42  HCT 45.1 39.1  PLT 214 PLATELET CLUMPS NOTED ON SMEAR, COUNT APPEARS ADEQUATE    Recent Labs  08/17/12 0848 08/18/12 0600  NA 134* 136  K 4.2 4.1  CL 97 104  CO2 24 21  BUN 10 16  CREATININE 0.62 0.53  GLUCOSE 102* 203*  CALCIUM 9.2 8.5    Recent Labs  08/17/12 1443  INR 1.02    PE:  L LE dressed and dry.  Toes with brisk cap refill.  Sens to LT intact at toes.  Assessment/Plan: 1 Day Post-Op Procedure(s) (LRB): INTRAMEDULLARY (IM) NAIL TIBIAL/Left (Left) OPEN REDUCTION INTERNAL FIXATION (ORIF) ANKLE FRACTURE/Left (Left) Up with therapy  NWB on the LLE.    Toni Arthurs 08/18/2012, 10:59 AM

## 2012-08-18 NOTE — Progress Notes (Signed)
HOME HEALTH AGENCIES SERVING [place]   Agencies that are Medicare-Certified and are affiliated with [facility]  Telephone Number Address  [facility]   [facility] has ownership interest in this company; however, you are under no obligation to use this agency. [phone] or  [phone] [address] [place], [place] [address] [email or web address]   Agencies that are Medicare-Certified and are not affiliated with [facility]                                                                                 Home Health Agency Telephone Number Address  [facility] [phone] Fax [phone] [address] [place], [place]  [address] [email or web address]  [facility] [phone] or [phone] Fax [phone] [address] [place], [place] [address] [email or web address]  [facility] [phone] Fax [phone] [address] [place], [place] [address] [email or web address]  [facility] [phone] Fax [phone] [address] [place], [place]  [address] [email or web address]  [facility] [phone] Fax [phone] [address] [place], [place] [address] [email or web address]  [facility] [phone] [address] [place], [place] [address] [email or web address]  [facility] [phone]  [address] [place], [place] [address] [email or web address]  [facility] [phone] or [phone] Fax number [phone] [address] [place], [place]  [address] [email or web address]  [facility] [phone] Fax [phone] [address] [place], [place]  [address]  [facility]  [phone] Fax [phone] [address] [place], [place] [address] [email or web address]       Agencies that are not Medicare-Certified and are not affiliated with [facility]   Home Health Agency Telephone Number Address  [facility] [phone] or [phone] Fax [phone] [address] [place], [place]  [address] [email or web address]  [facility] [phone] Fax [phone] [address] [place], [place]  [address] [email or web address]  [facility] [phone] Fax [phone] [address] [place], [place]  [address] [email or web address]  [facility]  [phone] Fax [phone] [address] [place], [place] [address] [email or web address]  [facility] [phone] Fax [phone] [address] [place], [place] [address]  [facility] [phone] or [phone] Fax [phone] [address] [place], [place]  [address] [email or web address]  [facility] [phone] or [phone] Fax [phone] [address] [place], [place]  [address] [email or web address]  [facility] [phone] Fax [phone] [address] [place], [place]  [address] [email or web address]  [facility] [phone] [address] [place], [place]  [address]  [facility] [phone] Fax [phone] [address] [place], [place]  [address]  [facility] [phone] Fax [phone] [address] [place], [place]  [address] [email or web address]  [facility] [phone] Fax [phone] [address] [place], [place] [address] [email or web address]  [facility] [phone] Fax [phone] [address] ,     [address] [email or web address]   

## 2012-08-19 NOTE — Discharge Summary (Signed)
I examined this patient and discussed the care plan with Dr Schmitz and the FPTS team and agree with assessment and plan as documented in the discharge note above.  

## 2012-09-20 ENCOUNTER — Emergency Department (HOSPITAL_BASED_OUTPATIENT_CLINIC_OR_DEPARTMENT_OTHER)
Admission: EM | Admit: 2012-09-20 | Discharge: 2012-09-20 | Disposition: A | Discharge status: Home / Self Care | Payer: Self-pay | Attending: Emergency Medicine | Admitting: Emergency Medicine

## 2012-09-20 ENCOUNTER — Emergency Department (HOSPITAL_BASED_OUTPATIENT_CLINIC_OR_DEPARTMENT_OTHER): Payer: Self-pay

## 2012-09-20 ENCOUNTER — Encounter (HOSPITAL_BASED_OUTPATIENT_CLINIC_OR_DEPARTMENT_OTHER): Payer: Self-pay | Admitting: Emergency Medicine

## 2012-09-20 DIAGNOSIS — I10 Essential (primary) hypertension: Secondary | ICD-10-CM | POA: Insufficient documentation

## 2012-09-20 DIAGNOSIS — Z9889 Other specified postprocedural states: Secondary | ICD-10-CM | POA: Insufficient documentation

## 2012-09-20 DIAGNOSIS — M7989 Other specified soft tissue disorders: Secondary | ICD-10-CM | POA: Insufficient documentation

## 2012-09-20 DIAGNOSIS — S82842S Displaced bimalleolar fracture of left lower leg, sequela: Secondary | ICD-10-CM

## 2012-09-20 DIAGNOSIS — Z8781 Personal history of (healed) traumatic fracture: Secondary | ICD-10-CM | POA: Insufficient documentation

## 2012-09-20 DIAGNOSIS — Z79899 Other long term (current) drug therapy: Secondary | ICD-10-CM | POA: Insufficient documentation

## 2012-09-20 DIAGNOSIS — Z87442 Personal history of urinary calculi: Secondary | ICD-10-CM | POA: Insufficient documentation

## 2012-09-20 DIAGNOSIS — Z87891 Personal history of nicotine dependence: Secondary | ICD-10-CM | POA: Insufficient documentation

## 2012-09-20 DIAGNOSIS — G8911 Acute pain due to trauma: Secondary | ICD-10-CM | POA: Insufficient documentation

## 2012-09-20 DIAGNOSIS — M79609 Pain in unspecified limb: Secondary | ICD-10-CM | POA: Insufficient documentation
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
## 2012-09-20 NOTE — ED Notes (Signed)
I re-wrapped patient ankle over pre-existing cast material to hold "cut" line intact. I notified Dr. Rennis Petty BP.

## 2013-06-22 ENCOUNTER — Encounter: Payer: Self-pay | Admitting: Internal Medicine

## 2013-06-22 ENCOUNTER — Ambulatory Visit: Payer: No Typology Code available for payment source | Attending: Internal Medicine | Admitting: Internal Medicine

## 2013-06-22 VITALS — BP 155/86 | HR 88 | Temp 97.9°F | Resp 17 | Wt 200.8 lb

## 2013-06-22 DIAGNOSIS — F32A Depression, unspecified: Secondary | ICD-10-CM | POA: Insufficient documentation

## 2013-06-22 DIAGNOSIS — M25579 Pain in unspecified ankle and joints of unspecified foot: Secondary | ICD-10-CM | POA: Insufficient documentation

## 2013-06-22 DIAGNOSIS — M25572 Pain in left ankle and joints of left foot: Secondary | ICD-10-CM | POA: Insufficient documentation

## 2013-06-22 DIAGNOSIS — F17200 Nicotine dependence, unspecified, uncomplicated: Secondary | ICD-10-CM | POA: Insufficient documentation

## 2013-06-22 DIAGNOSIS — I10 Essential (primary) hypertension: Secondary | ICD-10-CM | POA: Insufficient documentation

## 2013-06-22 DIAGNOSIS — K121 Other forms of stomatitis: Secondary | ICD-10-CM | POA: Insufficient documentation

## 2013-06-22 DIAGNOSIS — K1370 Unspecified lesions of oral mucosa: Secondary | ICD-10-CM

## 2013-06-22 DIAGNOSIS — K1239 Other oral mucositis (ulcerative): Secondary | ICD-10-CM

## 2013-06-22 DIAGNOSIS — F3289 Other specified depressive episodes: Secondary | ICD-10-CM | POA: Insufficient documentation

## 2013-06-22 DIAGNOSIS — R42 Dizziness and giddiness: Secondary | ICD-10-CM | POA: Insufficient documentation

## 2013-06-22 DIAGNOSIS — H538 Other visual disturbances: Secondary | ICD-10-CM | POA: Insufficient documentation

## 2013-06-22 DIAGNOSIS — M549 Dorsalgia, unspecified: Secondary | ICD-10-CM | POA: Insufficient documentation

## 2013-06-22 DIAGNOSIS — F329 Major depressive disorder, single episode, unspecified: Secondary | ICD-10-CM | POA: Insufficient documentation

## 2013-06-22 LAB — CBC WITH DIFFERENTIAL/PLATELET
BASOS ABS: 0.1 10*3/uL (ref 0.0–0.1)
Basophils Relative: 1 % (ref 0–1)
Eosinophils Absolute: 0.1 10*3/uL (ref 0.0–0.7)
Eosinophils Relative: 2 % (ref 0–5)
HEMATOCRIT: 41.6 % (ref 39.0–52.0)
HEMOGLOBIN: 14.8 g/dL (ref 13.0–17.0)
LYMPHS ABS: 2 10*3/uL (ref 0.7–4.0)
LYMPHS PCT: 31 % (ref 12–46)
MCH: 30.9 pg (ref 26.0–34.0)
MCHC: 35.6 g/dL (ref 30.0–36.0)
MCV: 86.8 fL (ref 78.0–100.0)
MONO ABS: 0.9 10*3/uL (ref 0.1–1.0)
MONOS PCT: 14 % — AB (ref 3–12)
NEUTROS ABS: 3.3 10*3/uL (ref 1.7–7.7)
Neutrophils Relative %: 52 % (ref 43–77)
Platelets: 242 10*3/uL (ref 150–400)
RBC: 4.79 MIL/uL (ref 4.22–5.81)
RDW: 13.4 % (ref 11.5–15.5)
WBC: 6.3 10*3/uL (ref 4.0–10.5)

## 2013-06-22 LAB — COMPLETE METABOLIC PANEL WITH GFR
ALBUMIN: 4 g/dL (ref 3.5–5.2)
ALT: 108 U/L — ABNORMAL HIGH (ref 0–53)
AST: 68 U/L — AB (ref 0–37)
Alkaline Phosphatase: 89 U/L (ref 39–117)
BUN: 12 mg/dL (ref 6–23)
CALCIUM: 9.3 mg/dL (ref 8.4–10.5)
CHLORIDE: 102 meq/L (ref 96–112)
CO2: 26 mEq/L (ref 19–32)
Creat: 0.75 mg/dL (ref 0.50–1.35)
GFR, Est African American: >89 mL/min
GFR, Est Non African American: >89 mL/min
Glucose, Bld: 111 mg/dL — ABNORMAL HIGH (ref 70–99)
POTASSIUM: 3.8 meq/L (ref 3.5–5.3)
Sodium: 137 mEq/L (ref 135–145)
Total Bilirubin: 0.7 mg/dL (ref 0.2–1.2)
Total Protein: 7.6 g/dL (ref 6.0–8.3)

## 2013-06-22 LAB — LIPID PANEL
CHOLESTEROL: 133 mg/dL (ref 0–200)
HDL: 38 mg/dL — ABNORMAL LOW (ref >39)
LDL CALC: 84 mg/dL (ref 0–99)
Total CHOL/HDL Ratio: 3.5 Ratio
Triglycerides: 53 mg/dL (ref <150)
VLDL: 11 mg/dL (ref 0–40)

## 2013-06-22 LAB — TSH: TSH: 1.29 u[IU]/mL (ref 0.350–4.500)

## 2013-06-22 MED ORDER — ACETAMINOPHEN-CODEINE #3 300-30 MG PO TABS: 1.0000 | ORAL_TABLET | ORAL | Status: DC | PRN

## 2013-06-22 MED ORDER — AMLODIPINE BESYLATE 10 MG PO TABS: 10.0000 mg | ORAL_TABLET | Freq: Every day | ORAL | Status: DC

## 2013-06-22 MED ORDER — LOSARTAN POTASSIUM 100 MG PO TABS: 100.0000 mg | ORAL_TABLET | Freq: Every day | ORAL | Status: DC
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
## 2013-06-22 NOTE — Patient Instructions (Signed)
Pain Medicine Instructions You have been given a prescription for pain medicines. These medicines may affect your ability to think clearly. They may also affect your ability to perform physical activities. Take these medicines only as needed for pain. You do not need to take them if you are not having pain, unless directed by your caregiver. You can take less than the prescribed dose if you find a smaller amount of medicine controls the pain. It may not be possible to make all of your pain go away, but you should be comfortable enough to move, breathe, and take care of yourself. After you start taking pain medicines, while taking the medicines, and for 8 hours after stopping the medicines:  Do not drive.  Do not operate machinery.  Do not operate power tools.  Do not sign legal documents.  Do not supervise children by yourself.  Do not participate in activities that require climbing or being in high places.  Do not enter a body of water (lake, river, ocean, spa, swimming pool) without an adult nearby who can help you. You may have been prescribed a pain medicine that contains acetaminophen (paracetamol). If so, take only the amount directed by your caregiver. Do not take any other acetaminophen while taking this medicine. An overdose of acetaminophen can result in severe liver damage. If you are taking other medicines, check the active ingredients for acetaminophen. Acetaminophen is found in hundreds of over-the-counter and prescription medicines. These include cold relief products, menstrual cramp relief medicines, fever-reducing medicines, acid indigestion relief products, and pain relief products. HOME CARE INSTRUCTIONS   Do not drink alcohol, take sleeping pills, or take other medicines until at least 8 hours after your last dose of pain medicine, or as directed by your caregiver.  Use a bulk stool softener if you become constipated from your pain medicines. Increasing your intake of fruits  and vegetables will also help.  Write down the times when you take your medicines. Look at the times before taking your next dose of medicine. It is easy to become confused while on pain medicines. Recording the times helps you to avoid an overdose. SEEK MEDICAL CARE IF:  Your medicine is not helping the pain go away.  You vomit or have diarrhea shortly after taking the medicine.  You develop new pain in areas that did not hurt before. SEEK IMMEDIATE MEDICAL CARE IF:  You feel dizzy or faint.  You feel there are other problems that might be caused by your medicine. MAKE SURE YOU:   Understand these instructions.  Will watch your condition.  Will get help right away if you are not doing well or get worse. Document Released: 04/08/2000 Document Revised: 04/27/2012 Document Reviewed: 12/15/2009 [facility] Patient Information 2014 [place], [place]. Hypertension As your heart beats, it forces blood through your arteries. This force is your blood pressure. If the pressure is too high, it is called hypertension (HTN) or high blood pressure. HTN is dangerous because you may have it and not know it. High blood pressure may mean that your heart has to work harder to pump blood. Your arteries may be narrow or stiff. The extra work puts you at risk for heart disease, stroke, and other problems.  Blood pressure consists of two numbers, a higher number over a lower, 110/72, for example. It is stated as "110 over 72." The ideal is below 120 for the top number (systolic) and under 80 for the bottom (diastolic). Write down your blood pressure today. You should  pay close attention to your blood pressure if you have certain conditions such as:  Heart failure.  Prior heart attack.  Diabetes  Chronic kidney disease.  Prior stroke.  Multiple risk factors for heart disease. To see if you have HTN, your blood pressure should be measured while you are seated with your arm held at the level of the heart.  It should be measured at least twice. A one-time elevated blood pressure reading (especially in the Emergency Department) does not mean that you need treatment. There may be conditions in which the blood pressure is different between your right and left arms. It is important to see your caregiver soon for a recheck. Most people have essential hypertension which means that there is not a specific cause. This type of high blood pressure may be lowered by changing lifestyle factors such as:  Stress.  Smoking.  Lack of exercise.  Excessive weight.  Drug/tobacco/alcohol use.  Eating less salt. Most people do not have symptoms from high blood pressure until it has caused damage to the body. Effective treatment can often prevent, delay or reduce that damage. TREATMENT  When a cause has been identified, treatment for high blood pressure is directed at the cause. There are a large number of medications to treat HTN. These fall into several categories, and your caregiver will help you select the medicines that are best for you. Medications may have side effects. You should review side effects with your caregiver. If your blood pressure stays high after you have made lifestyle changes or started on medicines,   Your medication(s) may need to be changed.  Other problems may need to be addressed.  Be certain you understand your prescriptions, and know how and when to take your medicine.  Be sure to follow up with your caregiver within the time frame advised (usually within two weeks) to have your blood pressure rechecked and to review your medications.  If you are taking more than one medicine to lower your blood pressure, make sure you know how and at what times they should be taken. Taking two medicines at the same time can result in blood pressure that is too low. SEEK IMMEDIATE MEDICAL CARE IF:  You develop a severe headache, blurred or changing vision, or confusion.  You have unusual weakness  or numbness, or a faint feeling.  You have severe chest or abdominal pain, vomiting, or breathing problems. MAKE SURE YOU:   Understand these instructions.  Will watch your condition.  Will get help right away if you are not doing well or get worse. Document Released: 12/31/2004 Document Revised: 03/25/2011 Document Reviewed: 08/21/2007 [facility] Patient Information 2014 [place].

## 2013-06-23 LAB — URINALYSIS, COMPLETE
Bacteria, UA: NONE SEEN
Bilirubin Urine: NEGATIVE
Casts: NONE SEEN
Crystals: NONE SEEN
GLUCOSE, UA: NEGATIVE mg/dL
Hgb urine dipstick: NEGATIVE
Ketones, ur: NEGATIVE mg/dL
LEUKOCYTES UA: NEGATIVE
NITRITE: NEGATIVE
PROTEIN: NEGATIVE mg/dL
SQUAMOUS EPITHELIAL / LPF: NONE SEEN
Specific Gravity, Urine: 1.013 (ref 1.005–1.030)
UROBILINOGEN UA: 0.2 mg/dL (ref 0.0–1.0)
pH: 8 (ref 5.0–8.0)

## 2013-06-24 ENCOUNTER — Telehealth: Payer: Self-pay | Admitting: *Deleted

## 2013-06-24 NOTE — Telephone Encounter (Signed)
Pt is aware of his lab results and he understands my instructions. He has an appointment for labs next Tuesday.

## 2013-06-24 NOTE — Telephone Encounter (Signed)
Message copied by Joan Mayans on Thu Jun 24, 2013  3:10 PM ------      Message from: Angelica Chessman E      Created: Wed Jun 23, 2013 11:05 AM       Please inform patient that his laboratory tests results show elevated liver enzymes which appears to be chronic otherwise other results are mostly within normal limit. We advised that he come in for a nurse visit for hemoglobin A1c to make sure he is not diabetic. We will also needs to do some other tests for the elevated liver enzymes. We advice that patient stay away from alcohol and Tylenol or any other medication that may affect the liver ------

## 2013-06-25 ENCOUNTER — Telehealth: Payer: Self-pay | Admitting: *Deleted

## 2013-06-25 NOTE — Telephone Encounter (Signed)
I called pt to f/u with his care. Pt said that he will be complaint with his treatment plan and that he is going to stay away from alcohol and tylenol products. Pt will be returning for labs next week.

## 2013-06-29 ENCOUNTER — Ambulatory Visit: Payer: No Typology Code available for payment source | Attending: Internal Medicine

## 2013-06-29 DIAGNOSIS — R945 Abnormal results of liver function studies: Secondary | ICD-10-CM

## 2013-06-29 DIAGNOSIS — R7989 Other specified abnormal findings of blood chemistry: Secondary | ICD-10-CM

## 2013-06-29 LAB — POCT GLYCOSYLATED HEMOGLOBIN (HGB A1C): Hemoglobin A1C: 6.1

## 2013-06-30 LAB — HIV ANTIBODY (ROUTINE TESTING W REFLEX): HIV 1&2 Ab, 4th Generation: NONREACTIVE

## 2013-06-30 LAB — HEPATIC FUNCTION PANEL
ALK PHOS: 87 U/L (ref 39–117)
ALT: 95 U/L — AB (ref 0–53)
AST: 54 U/L — AB (ref 0–37)
Albumin: 3.7 g/dL (ref 3.5–5.2)
BILIRUBIN DIRECT: 0.1 mg/dL (ref 0.0–0.3)
BILIRUBIN INDIRECT: 0.3 mg/dL (ref 0.2–1.2)
Total Bilirubin: 0.4 mg/dL (ref 0.2–1.2)
Total Protein: 7 g/dL (ref 6.0–8.3)

## 2013-07-06 ENCOUNTER — Telehealth: Payer: Self-pay | Admitting: Emergency Medicine

## 2013-07-06 NOTE — Telephone Encounter (Signed)
Message copied by Ricci Barker on Tue Jul 06, 2013 11:04 AM ------      Message from: Tresa Garter      Created: Sun Jul 04, 2013  5:14 PM       Please inform patient that the liver enzymes are still slightly elevated, HIV is negative. We will continue to monitor the liver function. Please avoid excessive use of over the counter Tylenol as this may cause further damage to the liver ------

## 2013-07-06 NOTE — Telephone Encounter (Signed)
Pt given lab results with instructions to not take excessive amounts of Tylenol otc. Pt verbalized understanding

## 2013-12-23 ENCOUNTER — Ambulatory Visit: Payer: Self-pay | Admitting: Internal Medicine

## 2014-07-07 ENCOUNTER — Other Ambulatory Visit: Payer: Self-pay | Admitting: Internal Medicine

## 2014-07-11 ENCOUNTER — Other Ambulatory Visit: Payer: Self-pay | Admitting: *Deleted

## 2014-07-11 ENCOUNTER — Telehealth: Payer: Self-pay | Admitting: Internal Medicine

## 2014-07-11 DIAGNOSIS — I10 Essential (primary) hypertension: Secondary | ICD-10-CM

## 2014-07-11 MED ORDER — AMLODIPINE BESYLATE 10 MG PO TABS: 10.0000 mg | ORAL_TABLET | Freq: Every day | ORAL | Status: DC

## 2014-07-11 MED ORDER — LOSARTAN POTASSIUM 100 MG PO TABS: 100.0000 mg | ORAL_TABLET | Freq: Every day | ORAL | Status: DC

## 2014-07-11 NOTE — Telephone Encounter (Signed)
Patient called in to request refill on his HTN blood pressure medication.  Patient has not been seen since last June.  Refilled patient's medications for 1 month supply and told patient to call back July 1 to make an appointment to see Dr. Doreene Burke.

## 2014-07-11 NOTE — Telephone Encounter (Signed)
Patient called requesting medication refill on losartan (COZAAR) 100 MG tablet and amLODipine (NORVASC) 10 MG tablet

## 2014-08-10 ENCOUNTER — Telehealth: Payer: Self-pay | Admitting: General Practice

## 2014-08-10 ENCOUNTER — Other Ambulatory Visit: Payer: Self-pay

## 2014-08-10 DIAGNOSIS — I10 Essential (primary) hypertension: Secondary | ICD-10-CM

## 2014-08-10 MED ORDER — AMLODIPINE BESYLATE 10 MG PO TABS: 10.0000 mg | ORAL_TABLET | Freq: Every day | ORAL | Status: DC

## 2014-08-10 MED ORDER — LOSARTAN POTASSIUM 100 MG PO TABS: 100.0000 mg | ORAL_TABLET | Freq: Every day | ORAL | Status: DC

## 2014-08-10 NOTE — Telephone Encounter (Signed)
Patient requesting medication refill for BP medications. Patient is all out of medications.

## 2014-09-01 ENCOUNTER — Ambulatory Visit: Payer: Self-pay | Attending: Internal Medicine | Admitting: Internal Medicine

## 2014-09-01 ENCOUNTER — Encounter: Payer: Self-pay | Admitting: Internal Medicine

## 2014-09-01 VITALS — BP 128/86 | HR 87 | Temp 97.6°F | Resp 18 | Ht 70.0 in | Wt 206.0 lb

## 2014-09-01 DIAGNOSIS — Z760 Encounter for issue of repeat prescription: Secondary | ICD-10-CM | POA: Insufficient documentation

## 2014-09-01 DIAGNOSIS — G894 Chronic pain syndrome: Secondary | ICD-10-CM

## 2014-09-01 DIAGNOSIS — Z79899 Other long term (current) drug therapy: Secondary | ICD-10-CM | POA: Insufficient documentation

## 2014-09-01 DIAGNOSIS — I10 Essential (primary) hypertension: Secondary | ICD-10-CM

## 2014-09-01 DIAGNOSIS — M25572 Pain in left ankle and joints of left foot: Secondary | ICD-10-CM

## 2014-09-01 MED ORDER — AMITRIPTYLINE HCL 75 MG PO TABS: 75.0000 mg | ORAL_TABLET | Freq: Every evening | ORAL | Status: DC | PRN

## 2014-09-01 MED ORDER — AMLODIPINE BESYLATE 10 MG PO TABS: 10.0000 mg | ORAL_TABLET | Freq: Every day | ORAL | Status: DC

## 2014-09-01 MED ORDER — LOSARTAN POTASSIUM 100 MG PO TABS: 100.0000 mg | ORAL_TABLET | Freq: Every day | ORAL | Status: DC

## 2014-09-01 NOTE — Patient Instructions (Signed)
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
## 2014-09-01 NOTE — Progress Notes (Signed)
Pt here for follow up and medication refill.  Pt reports dizzyness that has been occurring since he has been on BP medication. Dizzyness comes and goes.  Pt needs refills on losartan and amlodipine. Pt has taken his morning medications today.

## 2015-09-12 ENCOUNTER — Other Ambulatory Visit: Payer: Self-pay | Admitting: Internal Medicine

## 2015-09-12 DIAGNOSIS — I10 Essential (primary) hypertension: Secondary | ICD-10-CM

## 2015-10-31 ENCOUNTER — Other Ambulatory Visit: Payer: Self-pay | Admitting: Internal Medicine

## 2015-10-31 DIAGNOSIS — I10 Essential (primary) hypertension: Secondary | ICD-10-CM

## 2015-11-03 ENCOUNTER — Other Ambulatory Visit: Payer: Self-pay | Admitting: Internal Medicine

## 2015-11-03 DIAGNOSIS — I10 Essential (primary) hypertension: Secondary | ICD-10-CM

## 2015-11-06 ENCOUNTER — Other Ambulatory Visit: Payer: Self-pay | Admitting: Internal Medicine

## 2015-11-06 ENCOUNTER — Telehealth: Payer: Self-pay | Admitting: Internal Medicine

## 2015-11-06 DIAGNOSIS — I10 Essential (primary) hypertension: Secondary | ICD-10-CM

## 2015-11-06 NOTE — Telephone Encounter (Signed)
Patient called the office to request medication refill for amLODipine (NORVASC) 10 MG tablet and losartan (COZAAR) 100 MG tablet. Patient has an appt on 11/2 but doesn't have any medication left. Please send medication to our pharmacy [facility]).  Thank you.

## 2015-11-07 MED ORDER — LOSARTAN POTASSIUM 100 MG PO TABS: 100.0000 mg | ORAL_TABLET | Freq: Every day | ORAL | 0 refills | Status: DC

## 2015-11-07 MED ORDER — AMLODIPINE BESYLATE 10 MG PO TABS: 10.0000 mg | ORAL_TABLET | Freq: Every day | ORAL | 0 refills | Status: DC

## 2015-11-07 NOTE — Telephone Encounter (Signed)
Requested medications refilled 

## 2015-11-16 ENCOUNTER — Encounter: Payer: Self-pay | Admitting: Internal Medicine

## 2015-11-16 ENCOUNTER — Ambulatory Visit: Payer: Medicare Other | Attending: Internal Medicine | Admitting: Internal Medicine

## 2015-11-16 VITALS — BP 160/87 | HR 84 | Temp 98.4°F | Resp 18 | Ht 71.0 in | Wt 209.0 lb

## 2015-11-16 DIAGNOSIS — M25572 Pain in left ankle and joints of left foot: Secondary | ICD-10-CM | POA: Diagnosis not present

## 2015-11-16 DIAGNOSIS — G894 Chronic pain syndrome: Secondary | ICD-10-CM | POA: Diagnosis not present

## 2015-11-16 DIAGNOSIS — Z87442 Personal history of urinary calculi: Secondary | ICD-10-CM | POA: Diagnosis not present

## 2015-11-16 DIAGNOSIS — I10 Essential (primary) hypertension: Secondary | ICD-10-CM

## 2015-11-16 DIAGNOSIS — Z79899 Other long term (current) drug therapy: Secondary | ICD-10-CM | POA: Diagnosis not present

## 2015-11-16 MED ORDER — AMLODIPINE BESYLATE 10 MG PO TABS: 10.0000 mg | ORAL_TABLET | Freq: Every day | ORAL | 3 refills | Status: DC

## 2015-11-16 MED ORDER — LOSARTAN POTASSIUM 100 MG PO TABS: 100.0000 mg | ORAL_TABLET | Freq: Every day | ORAL | 3 refills | Status: DC

## 2015-11-16 NOTE — Patient Instructions (Signed)
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
## 2015-11-16 NOTE — Progress Notes (Signed)
Patient is here for med refill  Patient has been out of medication for the past few weeks. Patient has been taking old prescriptions of amlodipine and clonidine.  Patient declined flu vaccine today.  Patient has taken medication today. Patient has eaten today.  Patient request a referral to pain clinic.

## 2015-11-17 ENCOUNTER — Other Ambulatory Visit: Payer: Self-pay | Admitting: Internal Medicine

## 2015-11-17 DIAGNOSIS — M25572 Pain in left ankle and joints of left foot: Secondary | ICD-10-CM

## 2015-11-17 NOTE — Progress Notes (Signed)
Reordered due to specimen not being able to be obtained.

## 2016-09-02 ENCOUNTER — Other Ambulatory Visit: Payer: Self-pay | Admitting: Internal Medicine

## 2016-09-02 DIAGNOSIS — I10 Essential (primary) hypertension: Secondary | ICD-10-CM

## 2017-02-06 ENCOUNTER — Other Ambulatory Visit: Payer: Self-pay | Admitting: Internal Medicine

## 2017-02-06 DIAGNOSIS — I10 Essential (primary) hypertension: Secondary | ICD-10-CM

## 2017-02-21 ENCOUNTER — Telehealth: Payer: Self-pay | Admitting: Internal Medicine

## 2017-02-21 NOTE — Telephone Encounter (Signed)
Patient has not been seen in over a year. Will forward to PCP

## 2017-02-21 NOTE — Telephone Encounter (Signed)
Patient called and requested for a refill on listed medications.  losartan (COZAAR) 100 MG tablet [[id number]]  amLODipine (NORVASC) 10 MG tablet [[id number]]   [facility] pharmacy

## 2017-03-19 ENCOUNTER — Ambulatory Visit (HOSPITAL_COMMUNITY)
Admission: EM | Admit: 2017-03-19 | Discharge: 2017-03-19 | Disposition: A | Discharge status: Home / Self Care | Payer: Medicare Other | Attending: Family Medicine | Admitting: Family Medicine

## 2017-03-19 ENCOUNTER — Encounter (HOSPITAL_COMMUNITY): Payer: Self-pay | Admitting: Emergency Medicine

## 2017-03-19 DIAGNOSIS — H538 Other visual disturbances: Secondary | ICD-10-CM | POA: Diagnosis not present

## 2017-03-19 DIAGNOSIS — R51 Headache: Secondary | ICD-10-CM

## 2017-03-19 DIAGNOSIS — R42 Dizziness and giddiness: Secondary | ICD-10-CM | POA: Diagnosis not present

## 2017-03-19 DIAGNOSIS — Z760 Encounter for issue of repeat prescription: Secondary | ICD-10-CM

## 2017-03-19 DIAGNOSIS — I10 Essential (primary) hypertension: Secondary | ICD-10-CM

## 2017-03-19 MED ORDER — AMLODIPINE BESYLATE 10 MG PO TABS: 10.0000 mg | ORAL_TABLET | Freq: Every day | ORAL | 0 refills | Status: DC

## 2017-03-19 MED ORDER — LOSARTAN POTASSIUM 100 MG PO TABS: 100.0000 mg | ORAL_TABLET | Freq: Every day | ORAL | 0 refills | Status: DC

## 2017-03-19 NOTE — Discharge Instructions (Signed)
Normal neurology exam today. However, as discussed, your story if concerning for a small brain bleed. Please monitor closely for any worsening of symptoms, headache/blurry vision, nausea/vomiting, confusion/altered mental status, dizziness, weakness, passing out, imbalance, go to the emergency department for further evaluation.   I have refilled your blood pressure medicine for 30 days. Please follow up with PCP for further management and refills needed.
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
## 2017-03-24 ENCOUNTER — Emergency Department (HOSPITAL_COMMUNITY): Payer: No Typology Code available for payment source

## 2017-03-24 ENCOUNTER — Other Ambulatory Visit: Payer: Self-pay

## 2017-03-24 ENCOUNTER — Emergency Department (HOSPITAL_COMMUNITY)
Admission: EM | Admit: 2017-03-24 | Discharge: 2017-03-24 | Discharge status: Against Medical Advice | Payer: No Typology Code available for payment source | Attending: Emergency Medicine | Admitting: Emergency Medicine

## 2017-03-24 ENCOUNTER — Encounter (HOSPITAL_COMMUNITY): Payer: Self-pay

## 2017-03-24 DIAGNOSIS — F17200 Nicotine dependence, unspecified, uncomplicated: Secondary | ICD-10-CM | POA: Diagnosis not present

## 2017-03-24 DIAGNOSIS — R739 Hyperglycemia, unspecified: Secondary | ICD-10-CM | POA: Diagnosis not present

## 2017-03-24 DIAGNOSIS — I10 Essential (primary) hypertension: Secondary | ICD-10-CM | POA: Insufficient documentation

## 2017-03-24 DIAGNOSIS — R42 Dizziness and giddiness: Secondary | ICD-10-CM | POA: Insufficient documentation

## 2017-03-24 DIAGNOSIS — R51 Headache: Secondary | ICD-10-CM | POA: Diagnosis not present

## 2017-03-24 DIAGNOSIS — Z79899 Other long term (current) drug therapy: Secondary | ICD-10-CM | POA: Diagnosis not present

## 2017-03-24 DIAGNOSIS — G44309 Post-traumatic headache, unspecified, not intractable: Secondary | ICD-10-CM

## 2017-03-24 DIAGNOSIS — F17210 Nicotine dependence, cigarettes, uncomplicated: Secondary | ICD-10-CM | POA: Insufficient documentation

## 2017-03-24 LAB — BASIC METABOLIC PANEL
ANION GAP: 11 (ref 5–15)
BUN: 13 mg/dL (ref 6–20)
CO2: 21 mmol/L — AB (ref 22–32)
Calcium: 9.3 mg/dL (ref 8.9–10.3)
Chloride: 98 mmol/L — ABNORMAL LOW (ref 101–111)
Creatinine, Ser: 0.73 mg/dL (ref 0.61–1.24)
GFR calc Af Amer: >60 mL/min (ref >60)
GLUCOSE: 434 mg/dL — AB (ref 65–99)
Potassium: 3.9 mmol/L (ref 3.5–5.1)
Sodium: 130 mmol/L — ABNORMAL LOW (ref 135–145)

## 2017-03-24 LAB — CBC
HEMATOCRIT: 45 % (ref 39.0–52.0)
HEMOGLOBIN: 15.6 g/dL (ref 13.0–17.0)
MCH: 30.2 pg (ref 26.0–34.0)
MCHC: 34.7 g/dL (ref 30.0–36.0)
MCV: 87 fL (ref 78.0–100.0)
Platelets: 262 10*3/uL (ref 150–400)
RBC: 5.17 MIL/uL (ref 4.22–5.81)
RDW: 13.3 % (ref 11.5–15.5)
WBC: 7.3 10*3/uL (ref 4.0–10.5)

## 2017-03-24 MED ORDER — METFORMIN HCL 500 MG PO TABS: 500.0000 mg | ORAL_TABLET | Freq: Two times a day (BID) | ORAL | 0 refills | Status: DC
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
## 2017-03-24 NOTE — Discharge Instructions (Signed)
Start taking metformin twice daily as prescribed. A common side effect of this medication is diarrhea, so you may want to take one tablet once daily for 3 days before increasing to twice daily.  This medication will help lower your blood sugars.  Stay in a quiet, not simulating, dark environment. No TV, computer use, video games until headache is resolved completely. No contact sports until cleared by physician. Sleep laying upward to help relieve headache. Drink plenty of water and get plenty of rest. Alternate 600 mg of ibuprofen and [phone] mg of Tylenol every 3 hours as needed for pain. Do not exceed 4000 mg of Tylenol daily.  Follow-up with [facility] in their concussion clinic.  Follow-up with your primary care physician for reevaluation of your high blood sugar.  Return to the emergency department if any concerning signs or symptoms develop.
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
## 2017-03-25 ENCOUNTER — Emergency Department (HOSPITAL_COMMUNITY)
Admission: EM | Admit: 2017-03-25 | Discharge: 2017-03-25 | Disposition: A | Discharge status: Home / Self Care | Payer: Medicare Other | Attending: Emergency Medicine | Admitting: Emergency Medicine

## 2017-03-25 ENCOUNTER — Encounter (HOSPITAL_COMMUNITY): Payer: Self-pay | Admitting: *Deleted

## 2017-03-25 ENCOUNTER — Other Ambulatory Visit: Payer: Self-pay

## 2017-03-25 DIAGNOSIS — Z5321 Procedure and treatment not carried out due to patient leaving prior to being seen by health care provider: Secondary | ICD-10-CM | POA: Insufficient documentation

## 2017-03-25 DIAGNOSIS — R739 Hyperglycemia, unspecified: Secondary | ICD-10-CM | POA: Insufficient documentation

## 2017-03-25 LAB — BASIC METABOLIC PANEL
Anion gap: 12 (ref 5–15)
BUN: 12 mg/dL (ref 6–20)
CO2: 21 mmol/L — ABNORMAL LOW (ref 22–32)
Calcium: 9.1 mg/dL (ref 8.9–10.3)
Chloride: 100 mmol/L — ABNORMAL LOW (ref 101–111)
Creatinine, Ser: 0.63 mg/dL (ref 0.61–1.24)
GFR calc Af Amer: >60 mL/min (ref >60)
GFR calc non Af Amer: >60 mL/min (ref >60)
Glucose, Bld: 334 mg/dL — ABNORMAL HIGH (ref 65–99)
Potassium: 4.1 mmol/L (ref 3.5–5.1)
Sodium: 133 mmol/L — ABNORMAL LOW (ref 135–145)

## 2017-03-25 LAB — URINALYSIS, ROUTINE W REFLEX MICROSCOPIC
Bacteria, UA: NONE SEEN
Bilirubin Urine: NEGATIVE
Glucose, UA: >=500 mg/dL — AB
Hgb urine dipstick: NEGATIVE
Ketones, ur: NEGATIVE mg/dL
Leukocytes, UA: NEGATIVE
Nitrite: NEGATIVE
Protein, ur: NEGATIVE mg/dL
Specific Gravity, Urine: 1.036 — ABNORMAL HIGH (ref 1.005–1.030)
Squamous Epithelial / LPF: NONE SEEN
pH: 6 (ref 5.0–8.0)
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
## 2017-03-27 ENCOUNTER — Telehealth: Payer: Self-pay
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
## 2017-03-28 ENCOUNTER — Other Ambulatory Visit (INDEPENDENT_AMBULATORY_CARE_PROVIDER_SITE_OTHER): Payer: Medicare Other

## 2017-03-28 ENCOUNTER — Ambulatory Visit (INDEPENDENT_AMBULATORY_CARE_PROVIDER_SITE_OTHER): Payer: Medicare Other | Admitting: Family Medicine

## 2017-03-28 VITALS — BP 134/82 | HR 95 | Ht 70.0 in | Wt 165.0 lb

## 2017-03-28 DIAGNOSIS — S134XXA Sprain of ligaments of cervical spine, initial encounter: Secondary | ICD-10-CM | POA: Insufficient documentation

## 2017-03-28 DIAGNOSIS — R7989 Other specified abnormal findings of blood chemistry: Secondary | ICD-10-CM

## 2017-03-28 DIAGNOSIS — S060X1A Concussion with loss of consciousness of 30 minutes or less, initial encounter: Secondary | ICD-10-CM

## 2017-03-28 DIAGNOSIS — Z794 Long term (current) use of insulin: Secondary | ICD-10-CM | POA: Insufficient documentation

## 2017-03-28 DIAGNOSIS — E038 Other specified hypothyroidism: Secondary | ICD-10-CM | POA: Diagnosis not present

## 2017-03-28 DIAGNOSIS — R7309 Other abnormal glucose: Secondary | ICD-10-CM

## 2017-03-28 DIAGNOSIS — E559 Vitamin D deficiency, unspecified: Secondary | ICD-10-CM | POA: Diagnosis not present

## 2017-03-28 DIAGNOSIS — R739 Hyperglycemia, unspecified: Secondary | ICD-10-CM | POA: Diagnosis not present

## 2017-03-28 DIAGNOSIS — M2550 Pain in unspecified joint: Secondary | ICD-10-CM

## 2017-03-28 DIAGNOSIS — D649 Anemia, unspecified: Secondary | ICD-10-CM | POA: Diagnosis not present

## 2017-03-28 DIAGNOSIS — S060XAA Concussion with loss of consciousness status unknown, initial encounter: Secondary | ICD-10-CM | POA: Insufficient documentation

## 2017-03-28 DIAGNOSIS — S060X9A Concussion with loss of consciousness of unspecified duration, initial encounter: Secondary | ICD-10-CM | POA: Insufficient documentation

## 2017-03-28 DIAGNOSIS — E118 Type 2 diabetes mellitus with unspecified complications: Secondary | ICD-10-CM | POA: Insufficient documentation

## 2017-03-28 LAB — IBC PANEL
Iron: 131 ug/dL (ref 42–165)
SATURATION RATIOS: 31.8 % (ref 20.0–50.0)
Transferrin: 294 mg/dL (ref 212.0–360.0)

## 2017-03-28 LAB — COMPREHENSIVE METABOLIC PANEL
ALT: 84 U/L — ABNORMAL HIGH (ref 0–53)
AST: 41 U/L — AB (ref 0–37)
Albumin: 4.1 g/dL (ref 3.5–5.2)
Alkaline Phosphatase: 84 U/L (ref 39–117)
BUN: 11 mg/dL (ref 6–23)
CHLORIDE: 98 meq/L (ref 96–112)
CO2: 29 mEq/L (ref 19–32)
Calcium: 9.8 mg/dL (ref 8.4–10.5)
Creatinine, Ser: 0.72 mg/dL (ref 0.40–1.50)
GFR: 115.83 mL/min (ref >60.00)
GLUCOSE: 295 mg/dL — AB (ref 70–99)
POTASSIUM: 4.3 meq/L (ref 3.5–5.1)
SODIUM: 134 meq/L — AB (ref 135–145)
Total Bilirubin: 0.6 mg/dL (ref 0.2–1.2)
Total Protein: 7 g/dL (ref 6.0–8.3)

## 2017-03-28 LAB — SEDIMENTATION RATE: Sed Rate: 16 mm/hr (ref 0–20)

## 2017-03-28 LAB — HEMOGLOBIN A1C: Hgb A1c MFr Bld: 17.3 % — ABNORMAL HIGH (ref 4.6–6.5)

## 2017-03-28 LAB — VITAMIN D 25 HYDROXY (VIT D DEFICIENCY, FRACTURES): VITD: 23.35 ng/mL — AB (ref 30.00–100.00)

## 2017-03-28 LAB — TSH: TSH: 1.88 u[IU]/mL (ref 0.35–4.50)

## 2017-03-28 LAB — URIC ACID: URIC ACID, SERUM: 2.9 mg/dL — AB (ref 4.0–7.8)

## 2017-03-28 NOTE — Assessment & Plan Note (Signed)
Discussed with patient in great length.  Continue some medications but I do feel that this could be difficult secondary to patient also having a concussion.  Would like to stay away from muscle relaxers at this time.  Discussed hydration, topical anti-inflammatories given.  Follow-up again in 5 days

## 2017-03-28 NOTE — Assessment & Plan Note (Signed)
Significant.  Discussed taking the metformin regularly.  Due to the hypoglycemia and possible type 1 diabetes.  A1c pending.  Encourage patient follow-up with primary care provider.  Worsening symptoms consider the possibility of send to endocrinology.  Within going into the weekend patient knows if any worsening symptoms to seek medical attention immediately.
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
## 2017-03-28 NOTE — Patient Instructions (Signed)
Good to see you  I actually do feel some of this is from your blood sugar and you do need to see your regular doctor.  We will get labs again today  Fish oil 2 grams daily  Vitamin D 2000 IU daily  CoQ10 400mg  daily  Stay well hydrated!   If you feel worse you need to go to emergency room  See me again on Tuesday

## 2017-03-30 LAB — ANA: ANA: NEGATIVE

## 2017-03-31 ENCOUNTER — Telehealth: Payer: Self-pay | Admitting: Internal Medicine

## 2017-03-31 NOTE — Telephone Encounter (Signed)
[facility] Called to informed the pcp that the PT A1C was 17.3 in the appt they want to informed of this just incase you need to schedule an appt with the Pt, please follow up

## 2017-03-31 NOTE — Telephone Encounter (Signed)
Patient has not been seen in clinic since 11/16/15. Patient definitely needs to be reestablished and contacted with appointment for DM.

## 2017-04-01 ENCOUNTER — Ambulatory Visit (INDEPENDENT_AMBULATORY_CARE_PROVIDER_SITE_OTHER): Payer: Medicare Other | Admitting: Family Medicine

## 2017-04-01 ENCOUNTER — Telehealth: Payer: Self-pay | Admitting: Family Medicine

## 2017-04-01 ENCOUNTER — Other Ambulatory Visit: Payer: Self-pay

## 2017-04-01 VITALS — BP 140/86 | HR 96 | Ht 70.0 in | Wt 177.0 lb

## 2017-04-01 DIAGNOSIS — R739 Hyperglycemia, unspecified: Secondary | ICD-10-CM | POA: Diagnosis not present

## 2017-04-01 DIAGNOSIS — R7309 Other abnormal glucose: Secondary | ICD-10-CM | POA: Diagnosis not present

## 2017-04-01 DIAGNOSIS — S060X1D Concussion with loss of consciousness of 30 minutes or less, subsequent encounter: Secondary | ICD-10-CM

## 2017-04-01 LAB — GLUCOSE, POCT (MANUAL RESULT ENTRY): POC Glucose: 381 mg/dl — AB (ref 70–99)

## 2017-04-01 MED ORDER — INSULIN GLARGINE 100 UNIT/ML SOLOSTAR PEN: 15.0000 [IU] | PEN_INJECTOR | Freq: Every day | SUBCUTANEOUS | 3 refills | Status: DC

## 2017-04-01 NOTE — Assessment & Plan Note (Signed)
Patient at this point we will do more of a monitoring for the concussion symptoms.  I do feel that some of his symptoms are more secondary to the hyperglycemia that needs to be appropriately fixed first and then will consider.  Underlying depression could also be playing a role.  Follow-up with me again in 7-10 days.

## 2017-04-01 NOTE — Patient Instructions (Addendum)
Good to see you  I think the diabetes is the true concern  We will get you in with endocrinology  Lantus now 10 units at night  Please check blood valuse 2 times a day and keep a log for your blood sugar.  If your value is less then 1500 at night do 5 units of the lantus instead of 10  I do want ot see you again in 10 days to make sure you are doing better  If you feel worse please go to the emergency room.

## 2017-04-01 NOTE — Assessment & Plan Note (Signed)
Patient blood glucose today was 381.  Patient will need insulin.  Started on a low-dose of Lantus.  We discussed that this would likely be very low and only helped out somewhat.  We discussed with patient in great length O.  Patient is to follow-up with primary care provider.  Referral to endocrinology to see if this would be more beneficial.  Follow-up again with me in 7-10 days to make sure improving.
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
## 2017-04-01 NOTE — Telephone Encounter (Signed)
Copied from [place]. Topic: Quick Communication - See Telephone Encounter >> Apr 01, 2017 11:48 AM Synthia Innocent wrote: CRM for notification. See Telephone encounter for:  Insurance is requesting PA on Insulin Glargine (LANTUS SOLOSTAR) 100 UNIT/ML Solostar Pen, requesting to speak to nurse 04/01/17.

## 2017-04-02 MED ORDER — INSULIN DETEMIR 100 UNIT/ML FLEXPEN: 15.0000 [IU] | PEN_INJECTOR | Freq: Every day | SUBCUTANEOUS | 11 refills | Status: DC

## 2017-04-02 NOTE — Telephone Encounter (Signed)
Spoke to insurance. The preferred med is Levemir. Rx sent into pharmacy.

## 2017-04-09 NOTE — Progress Notes (Signed)
[facility] [address] [place], [place] [address] Phone: [phone] Subjective:    CC:   Elevated blood sugars, headache, possible concussion follow-up  [id number]  Phillip Paul is a 67 y.o. male is coming in for blood sugar follow up. Patient said that he has been feeling the same since last visit. He is using the insulin and notes that his blood sugar is improving.  Patient has brought his numbers.  Seems to be mostly in the low 200s at this time.  Unfortunately patient continues to have the same symptoms.  If anything the headaches seem to be worsening.     Past Medical History:  Diagnosis Date  . Abnormal EKG   . Dizzy   . Hypertension   . Kidney stones   . Racing heart beat    Past Surgical History:  Procedure Laterality Date  . CYSTOSCOPY     LEFT URETEROSCOPIC STONE MANIPULATION AND REMOVAL; PLACEMENT OF  LEFT DOUBLE-J URETERAL STENT  . CYSTOSCOPY W/ URETERAL STENT PLACEMENT     LEFT DOUBLE-J URETERAL STENT  . ORIF ANKLE FRACTURE Left 08/17/2012   Procedure: OPEN REDUCTION INTERNAL FIXATION (ORIF) ANKLE FRACTURE/Left;  Surgeon: Wylene Simmer, MD;  Location: [facility];  Service: Orthopedics;  Laterality: Left;  . TIBIA IM NAIL INSERTION Left 08/17/2012   Procedure: INTRAMEDULLARY (IM) NAIL TIBIAL/Left;  Surgeon: Wylene Simmer, MD;  Location: [facility];  Service: Orthopedics;  Laterality: Left;   Social History   Socioeconomic History  . Marital status: Single    Spouse name: Not on file  . Number of children: Not on file  . Years of education: Not on file  . Highest education level: Not on file  Occupational History  . Not on file  Social Needs  . Financial resource strain: Not on file  . Food insecurity:    Worry: Not on file    Inability: Not on file  . Transportation needs:    Medical: Not on file    Non-medical: Not on file  Tobacco Use  . Smoking status: Current Every Day Smoker    Packs/day: 0.25  . Tobacco comment: 1  pk/week  Substance and Sexual Activity  . Alcohol use: Yes    Comment: occasionally  . Drug use: No  . Sexual activity: Not on file  Lifestyle  . Physical activity:    Days per week: Not on file    Minutes per session: Not on file  . Stress: Not on file  Relationships  . Social connections:    Talks on phone: Not on file    Gets together: Not on file    Attends religious service: Not on file    Active member of club or organization: Not on file    Attends meetings of clubs or organizations: Not on file    Relationship status: Not on file  Other Topics Concern  . Not on file  Social History Narrative   NO FAMILY HX TO REPORT FROM NEW RECORDS   No Known Allergies No family history on file.  No family history of autoimmune   Past medical history, social, surgical and family history all reviewed in electronic medical record.  No pertanent information unless stated regarding to the chief complaint.   Review of Systems:Review of systems updated and as accurate as of 04/10/17  No  nausea, vomiting, diarrhea, constipation, abdominal pain, skin rash, fevers, chills, night sweats, weight loss, swollen lymph nodes, body aches, joint swelling, muscle aches,  chest pain, shortness of breath, mood changes.  Positive headaches, visual changes, dizziness positive photophobia  Objective  Blood pressure 132/80, pulse 100, height 5\' 10"  (1.778 m), weight 177 lb (80.3 kg), SpO2 96 %. Systems examined below as of 04/10/17   General: No apparent distress alert and oriented x3 mood and affect normal, dressed appropriately.  Patient does still look uncomfortable. HEENT: Pupils equal, nystagmus still noted especially with right lateral gaze Respiratory: Patient's speak in full sentences and does not appear short of breath  Cardiovascular: No lower extremity edema, non tender, no erythema  Skin: Warm dry intact with no signs of infection or rash on extremities or on axial skeleton.  Abdomen: Soft  nontender  Neuro: Cranial nerves II through XII are intact, neurovascularly intact in all extremities with 2+ DTRs and 2+ pulses.  Lymph: No lymphadenopathy of posterior or anterior cervical chain or axillae bilaterally.  Gait normal with good balance and coordination.  MSK:  Non tender with full range of motion and good stability and symmetric strength and tone of shoulders, elbows, wrist, hip, knee and ankles bilaterally.  Mild arthritic changes.     Impression and Recommendations:     This case required medical decision making of moderate complexity.      Note: This dictation was prepared with Dragon dictation along with smaller phrase technology. Any transcriptional errors that result from this process are unintentional.

## 2017-04-10 ENCOUNTER — Telehealth: Payer: Self-pay | Admitting: Internal Medicine

## 2017-04-10 ENCOUNTER — Ambulatory Visit (INDEPENDENT_AMBULATORY_CARE_PROVIDER_SITE_OTHER): Payer: Medicare Other | Admitting: Family Medicine

## 2017-04-10 VITALS — BP 132/80 | HR 100 | Ht 70.0 in | Wt 177.0 lb

## 2017-04-10 DIAGNOSIS — M542 Cervicalgia: Secondary | ICD-10-CM

## 2017-04-10 DIAGNOSIS — S060X1D Concussion with loss of consciousness of 30 minutes or less, subsequent encounter: Secondary | ICD-10-CM | POA: Diagnosis not present

## 2017-04-10 DIAGNOSIS — R739 Hyperglycemia, unspecified: Secondary | ICD-10-CM | POA: Diagnosis not present

## 2017-04-10 DIAGNOSIS — R51 Headache: Secondary | ICD-10-CM

## 2017-04-10 DIAGNOSIS — R519 Headache, unspecified: Secondary | ICD-10-CM

## 2017-04-10 LAB — GLUCOSE, POCT (MANUAL RESULT ENTRY): POC GLUCOSE: 176 mg/dL — AB (ref 70–99)

## 2017-04-10 MED ORDER — GLUCOSE BLOOD VI STRP: ORAL_STRIP | 12 refills | Status: DC

## 2017-04-10 NOTE — Assessment & Plan Note (Signed)
Patient is still elevated.  Increase patient's Levemir to 18 units.  Patient will continue to monitor blood sugars and will call if anything goes under 100.  Patient lives with a nurse and likely will do well.  Referral for endocrinology already made.  Seen primary care provider in under a month.  Patient will follow up with me again in 3 weeks

## 2017-04-10 NOTE — Telephone Encounter (Signed)
Copied from [place] [id number]. Topic: Quick Communication - See Telephone Encounter >> Apr 10, 2017  4:27 PM Ivar Drape wrote: CRM for notification. See Telephone encounter for: 04/10/17. Dr. Tamala Julian prescribed glucose blood test strip today for the patient but the prescription was sent in with only Medicare Part A insurance.  Medicare Part A do not pay for prescriptions.  The patient has coverage for Medicare Part B. So the office will have to call his preferred pharmacy [facility] to give them the Medicare Part B insurance information.

## 2017-04-10 NOTE — Patient Instructions (Signed)
Good to see you  Increase levimir to 18 units nightly.  PT will be calling you  MRI ordered and they will call you  Hopefully endocrinology will call as well.  Continue the vitamins See me again in 3 weeks

## 2017-04-10 NOTE — Telephone Encounter (Signed)
Called pharmacy. Resent rx into pharmacy.

## 2017-04-10 NOTE — Assessment & Plan Note (Signed)
Spent  25 minutes with patient face-to-face and had greater than 50% of counseling including as described above in assessment and plan.   Patient continues to have difficulty with the concussion and likely does have postconcussive syndrome.  I do believe though that patient still has some work to do with his diabetes.  Patient is concerned because the headaches are worsening at this point.  Patient CT of the head did show some small vessel disease.  We will get an MR angiogram and MRI brain with and without contrast.  We will also check cervical to make sure that there is no other vascular compromise that could be contributing.  As long as normal we will have patient start with vestibular neuro training with physical therapy.  Follow-up with me again in 3 weeks

## 2017-04-11 MED ORDER — GLUCOSE BLOOD VI STRP: ORAL_STRIP | 12 refills | Status: DC

## 2017-04-11 NOTE — Telephone Encounter (Signed)
Phillip Paul is out of test Strips, requesting to be sent to [facility] on [address], ph# [phone]

## 2017-04-11 NOTE — Telephone Encounter (Signed)
Resent test strips into [facility] on [address].

## 2017-04-14 MED ORDER — GLUCOSE BLOOD VI STRP: ORAL_STRIP | 12 refills | Status: DC

## 2017-04-14 NOTE — Telephone Encounter (Signed)
Spoke to [employer], resent rx for test strips with dx code to pharmacy.

## 2017-04-14 NOTE — Addendum Note (Signed)
Addended by: Douglass Rivers T on: 04/14/2017 02:41 PM   Modules accepted: Orders

## 2017-04-14 NOTE — Telephone Encounter (Signed)
Patient calling and states we must contact [facility], they have to script but we must call them first.

## 2017-04-23 ENCOUNTER — Other Ambulatory Visit: Payer: Self-pay | Admitting: *Deleted

## 2017-04-23 ENCOUNTER — Ambulatory Visit: Payer: Medicare Other

## 2017-04-23 ENCOUNTER — Ambulatory Visit: Payer: Medicare Other | Admitting: Physical Therapy

## 2017-04-23 MED ORDER — METFORMIN HCL 500 MG PO TABS: 500.0000 mg | ORAL_TABLET | Freq: Two times a day (BID) | ORAL | 0 refills | Status: DC

## 2017-04-23 NOTE — Telephone Encounter (Signed)
Per dr Tamala Julian, okay to refill metformin 500mg  BID.

## 2017-04-25 ENCOUNTER — Ambulatory Visit (INDEPENDENT_AMBULATORY_CARE_PROVIDER_SITE_OTHER): Payer: Medicare Other | Admitting: Physical Therapy

## 2017-04-25 DIAGNOSIS — R2681 Unsteadiness on feet: Secondary | ICD-10-CM

## 2017-04-25 DIAGNOSIS — R42 Dizziness and giddiness: Secondary | ICD-10-CM | POA: Diagnosis not present

## 2017-04-25 NOTE — Patient Instructions (Signed)
Access Code: [email or web address]  URL: [email or web address]  Date: 04/25/2017  Prepared by: Faustino Congress   Exercises  Seated Gaze Stabilization with Head Rotation - 1 sets - 30 seconds - 3x daily - 7x weekly  Forward Walking with Head Rotations - 1-2 reps - 1 sets - 20 Feet - 3x daily - 7x weekly
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
## 2017-05-02 ENCOUNTER — Other Ambulatory Visit: Payer: Self-pay | Admitting: Family Medicine

## 2017-05-02 ENCOUNTER — Ambulatory Visit
Admission: RE | Admit: 2017-05-02 | Discharge: 2017-05-02 | Disposition: A | Discharge status: Home / Self Care | Payer: Medicare Other | Source: Ambulatory Visit | Attending: Family Medicine | Admitting: Family Medicine

## 2017-05-02 DIAGNOSIS — R519 Headache, unspecified: Secondary | ICD-10-CM

## 2017-05-02 DIAGNOSIS — M542 Cervicalgia: Secondary | ICD-10-CM

## 2017-05-02 DIAGNOSIS — R51 Headache: Principal | ICD-10-CM

## 2017-05-02 DIAGNOSIS — H538 Other visual disturbances: Secondary | ICD-10-CM | POA: Diagnosis not present

## 2017-05-02 DIAGNOSIS — M4802 Spinal stenosis, cervical region: Secondary | ICD-10-CM | POA: Diagnosis not present

## 2017-05-04 NOTE — Progress Notes (Signed)
[facility] [address] [place], [place] [address] Phone: [phone] Subjective:     CC: headache follow up   KGM:[id number]  Phillip Paul is a 67 y.o. male coming in with complaint of headaches. He continues to have headaches daily and is also still experiencing dizziness. He has been compliant with insulin and has been taking his blood sugar which he states have been in normal range.  Patient did have MRI done.  The MRI was independently visualized by me showing small vessel disease noted.  No significant focal findings that would be consistent with a concussion at this moment. Patient also had an MRI of the neck done.  This was independently visualized by me.  Showed to have moderate to severe loss arthritic changes mostly in facet arthropathy with no significant spinal stenosis.  Unable to have the angiogram secondary to patient not being able to get the dye.  Patient does state that he continues to have headaches daily but is improving.  Not as much severity.  Improving in the blood glucose levels.  Seems to be between the 140-220 range most of the time.  18 units of insulin used at this time.  Reviewed patient's glucose meter.     Past Medical History:  Diagnosis Date  . Abnormal EKG   . Dizzy   . Hypertension   . Kidney stones   . Racing heart beat    Past Surgical History:  Procedure Laterality Date  . CYSTOSCOPY     LEFT URETEROSCOPIC STONE MANIPULATION AND REMOVAL; PLACEMENT OF  LEFT DOUBLE-J URETERAL STENT  . CYSTOSCOPY W/ URETERAL STENT PLACEMENT     LEFT DOUBLE-J URETERAL STENT  . ORIF ANKLE FRACTURE Left 08/17/2012   Procedure: OPEN REDUCTION INTERNAL FIXATION (ORIF) ANKLE FRACTURE/Left;  Surgeon: Wylene Simmer, MD;  Location: [facility];  Service: Orthopedics;  Laterality: Left;  . TIBIA IM NAIL INSERTION Left 08/17/2012   Procedure: INTRAMEDULLARY (IM) NAIL TIBIAL/Left;  Surgeon: Wylene Simmer, MD;  Location: [facility];  Service:  Orthopedics;  Laterality: Left;   Social History   Socioeconomic History  . Marital status: Single    Spouse name: Not on file  . Number of children: Not on file  . Years of education: Not on file  . Highest education level: Not on file  Occupational History  . Not on file  Social Needs  . Financial resource strain: Not on file  . Food insecurity:    Worry: Not on file    Inability: Not on file  . Transportation needs:    Medical: Not on file    Non-medical: Not on file  Tobacco Use  . Smoking status: Current Every Day Smoker    Packs/day: 0.25  . Tobacco comment: 1 pk/week  Substance and Sexual Activity  . Alcohol use: Yes    Comment: occasionally  . Drug use: No  . Sexual activity: Not on file  Lifestyle  . Physical activity:    Days per week: Not on file    Minutes per session: Not on file  . Stress: Not on file  Relationships  . Social connections:    Talks on phone: Not on file    Gets together: Not on file    Attends religious service: Not on file    Active member of club or organization: Not on file    Attends meetings of clubs or organizations: Not on file    Relationship status: Not on file  Other Topics  Concern  . Not on file  Social History Narrative   NO FAMILY HX TO REPORT FROM NEW RECORDS   No Known Allergies No family history on file.  No family history of autoimmune   Past medical history, social, surgical and family history all reviewed in electronic medical record.  No pertanent information unless stated regarding to the chief complaint.   Review of Systems:Review of systems updated and as accurate as of 05/05/17  No , visual changes, nausea, vomiting, diarrhea, constipation, dizziness, abdominal pain, skin rash, fevers, chills, night sweats, weight loss, swollen lymph nodes, body aches, joint swelling, muscle aches, chest pain, shortness of breath, mood changes.  Positive headache  Objective  Blood pressure 118/78, pulse 86, height 5\' 10"   (1.778 m), weight 178 lb (80.7 kg), SpO2 98 %. Systems examined below as of 05/05/17   General: No apparent distress alert and oriented x3 mood and affect normal, dressed appropriately.  HEENT: Pupils equal, extraocular movements intact nystagmus significantly improved. Respiratory: Patient's speak in full sentences and does not appear short of breath  Cardiovascular: No lower extremity edema, non tender, no erythema  Skin: Warm dry intact with no signs of infection or rash on extremities or on axial skeleton.  Abdomen: Soft nontender  Neuro: Cranial nerves II through XII are intact, neurovascularly intact in all extremities with 2+ DTRs and 2+ pulses.  Lymph: No lymphadenopathy of posterior or anterior cervical chain or axillae bilaterally.  Gait normal with good balance and coordination.  MSK:  Non tender with full range of motion and good stability and symmetric strength and tone of shoulders, elbows, wrist, hip, knee and ankles bilaterally.  Exam still shows some mild loss of range of motion with mild crepitus.  Negative Spurling's test.      Impression and Recommendations:     This case required medical decision making of moderate complexity.      Note: This dictation was prepared with Dragon dictation along with smaller phrase technology. Any transcriptional errors that result from this process are unintentional.

## 2017-05-05 ENCOUNTER — Ambulatory Visit (INDEPENDENT_AMBULATORY_CARE_PROVIDER_SITE_OTHER): Payer: Medicare Other | Admitting: Family Medicine

## 2017-05-05 VITALS — BP 118/78 | HR 86 | Ht 70.0 in | Wt 178.0 lb

## 2017-05-05 DIAGNOSIS — G44321 Chronic post-traumatic headache, intractable: Secondary | ICD-10-CM | POA: Diagnosis not present

## 2017-05-05 DIAGNOSIS — S060X1D Concussion with loss of consciousness of 30 minutes or less, subsequent encounter: Secondary | ICD-10-CM | POA: Diagnosis not present

## 2017-05-05 DIAGNOSIS — G8929 Other chronic pain: Secondary | ICD-10-CM

## 2017-05-05 DIAGNOSIS — R51 Headache: Secondary | ICD-10-CM

## 2017-05-05 DIAGNOSIS — S134XXD Sprain of ligaments of cervical spine, subsequent encounter: Secondary | ICD-10-CM

## 2017-05-05 DIAGNOSIS — R519 Headache, unspecified: Secondary | ICD-10-CM

## 2017-05-05 DIAGNOSIS — G44309 Post-traumatic headache, unspecified, not intractable: Secondary | ICD-10-CM | POA: Insufficient documentation

## 2017-05-05 MED ORDER — AMOXICILLIN-POT CLAVULANATE 875-125 MG PO TABS: 1.0000 | ORAL_TABLET | Freq: Two times a day (BID) | ORAL | 0 refills | Status: AC

## 2017-05-05 NOTE — Assessment & Plan Note (Signed)
Significant improvement

## 2017-05-05 NOTE — Patient Instructions (Addendum)
Good to see you  I think you are improving slowly  Augmentin 2 times a day for 10 days Ice is your friend.  Dr. Tomi Likens we will refer you and they will call you but if better then do not need to go.  I hope the physical goes well  Continue the vitamins Increase the insulin to 20 units.   See me again in 6 weeks just in case

## 2017-05-05 NOTE — Assessment & Plan Note (Signed)
Seems resolved 

## 2017-05-05 NOTE — Assessment & Plan Note (Signed)
Still having difficulty.  Post traumatic vs new onset diabetes.  Improved blood sugars.  Getting a new PCP in the near future with a likely will be beneficial.  Referred to neurology to help with more of a posttraumatic headaches.  Do not feel that concussion is still contributing.  Will give antibiotic increased sinusitis when looking at the MRI.  Follow-up again in 6 weeks

## 2017-05-07 ENCOUNTER — Encounter: Payer: Self-pay | Admitting: Nurse Practitioner

## 2017-05-07 ENCOUNTER — Ambulatory Visit: Payer: Medicare Other | Attending: Nurse Practitioner | Admitting: Nurse Practitioner

## 2017-05-07 VITALS — BP 117/82 | HR 85 | Temp 98.2°F | Ht 70.0 in | Wt 176.2 lb

## 2017-05-07 DIAGNOSIS — F419 Anxiety disorder, unspecified: Secondary | ICD-10-CM | POA: Insufficient documentation

## 2017-05-07 DIAGNOSIS — I10 Essential (primary) hypertension: Secondary | ICD-10-CM | POA: Diagnosis not present

## 2017-05-07 DIAGNOSIS — Z9889 Other specified postprocedural states: Secondary | ICD-10-CM | POA: Diagnosis not present

## 2017-05-07 DIAGNOSIS — Z760 Encounter for issue of repeat prescription: Secondary | ICD-10-CM | POA: Insufficient documentation

## 2017-05-07 DIAGNOSIS — Z87442 Personal history of urinary calculi: Secondary | ICD-10-CM | POA: Diagnosis not present

## 2017-05-07 DIAGNOSIS — E11649 Type 2 diabetes mellitus with hypoglycemia without coma: Secondary | ICD-10-CM | POA: Diagnosis not present

## 2017-05-07 DIAGNOSIS — Z8249 Family history of ischemic heart disease and other diseases of the circulatory system: Secondary | ICD-10-CM | POA: Insufficient documentation

## 2017-05-07 DIAGNOSIS — F0781 Postconcussional syndrome: Secondary | ICD-10-CM | POA: Diagnosis not present

## 2017-05-07 DIAGNOSIS — E1165 Type 2 diabetes mellitus with hyperglycemia: Secondary | ICD-10-CM

## 2017-05-07 DIAGNOSIS — Z794 Long term (current) use of insulin: Secondary | ICD-10-CM | POA: Diagnosis not present

## 2017-05-07 DIAGNOSIS — Z79899 Other long term (current) drug therapy: Secondary | ICD-10-CM | POA: Diagnosis not present

## 2017-05-07 LAB — GLUCOSE, POCT (MANUAL RESULT ENTRY): POC Glucose: 148 mg/dl — AB (ref 70–99)

## 2017-05-07 MED ORDER — TRUE METRIX METER W/DEVICE KIT: PACK | 0 refills | Status: DC

## 2017-05-07 MED ORDER — METFORMIN HCL 500 MG PO TABS: 500.0000 mg | ORAL_TABLET | Freq: Two times a day (BID) | ORAL | 3 refills | Status: DC

## 2017-05-07 MED ORDER — TRUEPLUS LANCETS 28G MISC
3 refills | Status: DC
Start: 2017-05-07 — End: 2022-09-06

## 2017-05-07 MED ORDER — LOSARTAN POTASSIUM 100 MG PO TABS: 100.0000 mg | ORAL_TABLET | Freq: Every day | ORAL | 0 refills | Status: DC

## 2017-05-07 MED ORDER — AMLODIPINE BESYLATE 10 MG PO TABS: 10.0000 mg | ORAL_TABLET | Freq: Every day | ORAL | 1 refills | Status: DC

## 2017-05-07 MED ORDER — GLUCOSE BLOOD VI STRP: ORAL_STRIP | 12 refills | Status: DC
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
## 2017-05-07 NOTE — Patient Instructions (Addendum)
You can take a MVI daily-Centrum Silver once daily. This has enough vitamin D for you to take to increase your vitamin d levels   Diabetes Mellitus and Nutrition When you have diabetes (diabetes mellitus), it is very important to have healthy eating habits because your blood sugar (glucose) levels are greatly affected by what you eat and drink. Eating healthy foods in the appropriate amounts, at about the same times every day, can help you:  Control your blood glucose.  Lower your risk of heart disease.  Improve your blood pressure.  Reach or maintain a healthy weight.  Every person with diabetes is different, and each person has different needs for a meal plan. Your health care provider may recommend that you work with a diet and nutrition specialist (dietitian) to make a meal plan that is best for you. Your meal plan may vary depending on factors such as:  The calories you need.  The medicines you take.  Your weight.  Your blood glucose, blood pressure, and cholesterol levels.  Your activity level.  Other health conditions you have, such as heart or kidney disease.  How do carbohydrates affect me? Carbohydrates affect your blood glucose level more than any other type of food. Eating carbohydrates naturally increases the amount of glucose in your blood. Carbohydrate counting is a method for keeping track of how many carbohydrates you eat. Counting carbohydrates is important to keep your blood glucose at a healthy level, especially if you use insulin or take certain oral diabetes medicines. It is important to know how many carbohydrates you can safely have in each meal. This is different for every person. Your dietitian can help you calculate how many carbohydrates you should have at each meal and for snack. Foods that contain carbohydrates include:  Bread, cereal, rice, pasta, and crackers.  Potatoes and corn.  Peas, beans, and lentils.  Milk and yogurt.  Fruit and  juice.  Desserts, such as cakes, cookies, ice cream, and candy.  How does alcohol affect me? Alcohol can cause a sudden decrease in blood glucose (hypoglycemia), especially if you use insulin or take certain oral diabetes medicines. Hypoglycemia can be a life-threatening condition. Symptoms of hypoglycemia (sleepiness, dizziness, and confusion) are similar to symptoms of having too much alcohol. If your health care provider says that alcohol is safe for you, follow these guidelines:  Limit alcohol intake to no more than 1 drink per day for nonpregnant women and 2 drinks per day for men. One drink equals 12 oz of beer, 5 oz of wine, or 1 oz of hard liquor.  Do not drink on an empty stomach.  Keep yourself hydrated with water, diet soda, or unsweetened iced tea.  Keep in mind that regular soda, juice, and other mixers may contain a lot of sugar and must be counted as carbohydrates.  What are tips for following this plan? Reading food labels  Start by checking the serving size on the label. The amount of calories, carbohydrates, fats, and other nutrients listed on the label are based on one serving of the food. Many foods contain more than one serving per package.  Check the total grams (g) of carbohydrates in one serving. You can calculate the number of servings of carbohydrates in one serving by dividing the total carbohydrates by 15. For example, if a food has 30 g of total carbohydrates, it would be equal to 2 servings of carbohydrates.  Check the number of grams (g) of saturated and trans fats in one  serving. Choose foods that have low or no amount of these fats.  Check the number of milligrams (mg) of sodium in one serving. Most people should limit total sodium intake to less than 2,300 mg per day.  Always check the nutrition information of foods labeled as "low-fat" or "nonfat". These foods may be higher in added sugar or refined carbohydrates and should be avoided.  Talk to your  dietitian to identify your daily goals for nutrients listed on the label. Shopping  Avoid buying canned, premade, or processed foods. These foods tend to be high in fat, sodium, and added sugar.  Shop around the outside edge of the grocery store. This includes fresh fruits and vegetables, bulk grains, fresh meats, and fresh dairy. Cooking  Use low-heat cooking methods, such as baking, instead of high-heat cooking methods like deep frying.  Cook using healthy oils, such as olive, canola, or sunflower oil.  Avoid cooking with butter, cream, or high-fat meats. Meal planning  Eat meals and snacks regularly, preferably at the same times every day. Avoid going long periods of time without eating.  Eat foods high in fiber, such as fresh fruits, vegetables, beans, and whole grains. Talk to your dietitian about how many servings of carbohydrates you can eat at each meal.  Eat 4-6 ounces of lean protein each day, such as lean meat, chicken, fish, eggs, or tofu. 1 ounce is equal to 1 ounce of meat, chicken, or fish, 1 egg, or 1/4 cup of tofu.  Eat some foods each day that contain healthy fats, such as avocado, nuts, seeds, and fish. Lifestyle   Check your blood glucose regularly.  Exercise at least 30 minutes 5 or more days each week, or as told by your health care provider.  Take medicines as told by your health care provider.  Do not use any products that contain nicotine or tobacco, such as cigarettes and e-cigarettes. If you need help quitting, ask your health care provider.  Work with a Social worker or diabetes educator to identify strategies to manage stress and any emotional and social challenges. What are some questions to ask my health care provider?  Do I need to meet with a diabetes educator?  Do I need to meet with a dietitian?  What number can I call if I have questions?  When are the best times to check my blood glucose? Where to find more information:  American Diabetes  Association: diabetes.org/food-and-fitness/food  Academy of Nutrition and Dietetics: PokerClues.dk  [employer] of Diabetes and Digestive and Kidney Diseases (NIH): ContactWire.be Summary  A healthy meal plan will help you control your blood glucose and maintain a healthy lifestyle.  Working with a diet and nutrition specialist (dietitian) can help you make a meal plan that is best for you.  Keep in mind that carbohydrates and alcohol have immediate effects on your blood glucose levels. It is important to count carbohydrates and to use alcohol carefully. This information is not intended to replace advice given to you by your health care provider. Make sure you discuss any questions you have with your health care provider. Document Released: 09/27/2004 Document Revised: 02/05/2016 Document Reviewed: 02/05/2016 Elsevier Interactive Patient Education  [employer].

## 2017-05-08 ENCOUNTER — Ambulatory Visit: Payer: Medicare Other

## 2017-05-09 ENCOUNTER — Other Ambulatory Visit: Payer: Self-pay

## 2017-05-09 ENCOUNTER — Encounter: Payer: Self-pay | Admitting: Physical Therapy

## 2017-05-09 ENCOUNTER — Telehealth: Payer: Self-pay

## 2017-05-09 ENCOUNTER — Encounter: Payer: Self-pay | Admitting: Neurology

## 2017-05-09 ENCOUNTER — Ambulatory Visit (INDEPENDENT_AMBULATORY_CARE_PROVIDER_SITE_OTHER): Payer: Medicare Other | Admitting: Physical Therapy

## 2017-05-09 DIAGNOSIS — E1165 Type 2 diabetes mellitus with hyperglycemia: Secondary | ICD-10-CM

## 2017-05-09 DIAGNOSIS — R2681 Unsteadiness on feet: Secondary | ICD-10-CM | POA: Diagnosis not present

## 2017-05-09 DIAGNOSIS — R42 Dizziness and giddiness: Secondary | ICD-10-CM | POA: Diagnosis not present

## 2017-05-09 MED ORDER — GLUCOSE BLOOD VI STRP: ORAL_STRIP | 12 refills | Status: DC

## 2017-05-09 MED ORDER — TRUE METRIX METER W/DEVICE KIT: PACK | 0 refills | Status: DC

## 2017-05-09 NOTE — Therapy (Signed)
[facility] [address] [place], [place], [address] Phone: [phone]   Fax:  [phone]  Physical Therapy Treatment  Patient Details  Name: Phillip Paul MRN: [id number] Date of Birth: 15-Sep-1950 Referring Provider: Dr Charlann Boxer   Encounter Date: 05/09/2017  PT End of Session - 05/09/17 1055    Visit Number  2    Number of Visits  8    Date for PT Re-Evaluation  06/20/17    Authorization Type  Medicare    PT Start Time  1017    PT Stop Time  1053    PT Time Calculation (min)  38 min    Equipment Utilized During Treatment  Gait belt    Activity Tolerance  Patient tolerated treatment well    Behavior During Therapy  [facility] for tasks assessed/performed       Past Medical History:  Diagnosis Date  . Abnormal EKG   . Anxiety   . Diabetes mellitus without complication ([place])   . Dizzy   . Hypertension   . Kidney stones   . Racing heart beat     Past Surgical History:  Procedure Laterality Date  . CYSTOSCOPY     LEFT URETEROSCOPIC STONE MANIPULATION AND REMOVAL; PLACEMENT OF  LEFT DOUBLE-J URETERAL STENT  . CYSTOSCOPY W/ URETERAL STENT PLACEMENT     LEFT DOUBLE-J URETERAL STENT  . ORIF ANKLE FRACTURE Left 08/17/2012   Procedure: OPEN REDUCTION INTERNAL FIXATION (ORIF) ANKLE FRACTURE/Left;  Surgeon: Wylene Simmer, MD;  Location: [facility];  Service: Orthopedics;  Laterality: Left;  . TIBIA IM NAIL INSERTION Left 08/17/2012   Procedure: INTRAMEDULLARY (IM) NAIL TIBIAL/Left;  Surgeon: Wylene Simmer, MD;  Location: [facility];  Service: Orthopedics;  Laterality: Left;    There were no vitals filed for this visit.  Subjective Assessment - 05/09/17 1015    Subjective  still having dizziness and headaches; sees Dr. Tomi Likens in July.  some improvement by limiting speed of head movements.  doing exercises; reports some dizziness    Patient Stated Goals  improve dizziness, balance and headache    Currently in Pain?  Yes    Pain Score  4      Pain Location  Head    Pain Orientation  Left;Right    Pain Descriptors / Indicators  Headache    Pain Type  Chronic pain;Acute pain    Pain Onset  More than a month ago    Pain Frequency  Constant    Aggravating Factors   increased activity, watching TV    Pain Relieving Factors  lying down with eyes closed             Vestibular Assessment - 05/09/17 1024      Vestibular Assessment   General Observation  symptoms 5-6/10 at beginning of session               [facility] Adult PT Treatment/Exercise - 05/09/17 1053      Self-Care   Self-Care  Other Self-Care Comments    Other Self-Care Comments   educated on current changes and limited activity as well as recent dx of DM and pt's concerns about gaining weight and decreased activity.  advised to follow up with nutrition consult that was ordered, and educated on walking program and gradual progression of activity as symptoms dictate.  educated on need for increased activity to stimulate vestibular system and progress mobility.  pt verbalized understanding      Vestibular Treatment/Exercise - 05/09/17 1025  Vestibular Treatment/Exercise   Vestibular Treatment Provided  Gaze    Gaze Exercises  X1 Viewing Horizontal;X1 Viewing Vertical;Eye/Head Exercise Horizontal;Eye/Head Exercise Vertical      X1 Viewing Horizontal   Foot Position  seated    Time  -- 30 sec    Reps  1      X1 Viewing Vertical   Foot Position  seated    Time  -- 30 sec    Reps  1      Eye/Head Exercise Horizontal   Foot Position  seated    Reps  10    Comments  corrective saccades; no increase in symptoms      Eye/Head Exercise Vertical   Foot Position  seated    Reps  10    Comments  corrective saccades; no increase in symptoms         Balance Exercises - 05/09/17 1039      Balance Exercises: Standing   Standing Eyes Closed  Foam/compliant surface;Narrow base of support (BOS);3 reps;10 secs;Head turns    Gait with Head Turns   Forward;4 reps horizontal/vertical 40' x 2 each way        PT Education - 05/09/17 1055    Education provided  Yes    Education Details  see self care, HEP and walking program    Person(s) Educated  Patient    Methods  Explanation;Handout;Demonstration    Comprehension  Verbalized understanding;Returned demonstration;Need further instruction       PT Short Term Goals - 04/25/17 1152      PT SHORT TERM GOAL #1   Title  independent with initial HEP    Status  New    Target Date  05/23/17      PT SHORT TERM GOAL #2   Title  improve FGA to >/= 25/30 for improved mobiltiy    Status  New    Target Date  05/23/17      PT SHORT TERM GOAL #3   Title  report 35% improvement in symptoms for improved function and mobility    Status  New    Target Date  05/23/17        PT Long Term Goals - 04/25/17 1153      PT LONG TERM GOAL #1   Title  independent with advanced HEP    Status  New    Target Date  06/20/17      PT LONG TERM GOAL #2   Title  report 70% improvement in symptoms    Status  New    Target Date  06/20/17      PT LONG TERM GOAL #3   Title  report ability to walk at least 1 mile without increase in symptoms for improved mobility and return to prior exercise    Status  New    Target Date  06/20/17      PT LONG TERM GOAL #4   Title  improve FGA to >/= 27/30 for improved balance and mobility    Status  New    Target Date  06/20/17            Plan - 05/09/17 1055    Clinical Impression Statement  Pt needed mod A with current HEP despite reporting compliance with exercises.  Added corner balance activities today and educated on use of corrective saccades as well as increasing activity to help progress mobility and improve symptoms.  Pt tolerated session well keeping symptoms < 7/10.    Rehab Potential  Good    PT Frequency  1x / week    PT Duration  8 weeks    PT Treatment/Interventions  ADLs/Self Care Home Management;Canalith  Repostioning;Cryotherapy;Electrical Stimulation;Ultrasound;Moist Heat;Gait training;Functional mobility training;Stair training;Therapeutic activities;Therapeutic exercise;Balance training;Neuromuscular re-education;Patient/family education;Manual techniques;Vestibular;Taping;Dry needling;Visual/perceptual remediation/compensation    PT Next Visit Plan  review HEP, progress as able, habituation and balance/dynamic gait tasks    PT Home Exercise Plan  Access Code: [email or web address]    Consulted and Agree with Plan of Care  Patient       Patient will benefit from skilled therapeutic intervention in order to improve the following deficits and impairments:  Abnormal gait, Dizziness, Pain, Decreased activity tolerance, Decreased balance, Impaired vision/preception  Visit Diagnosis: Dizziness and giddiness  Unsteadiness on feet     Problem List Patient Active Problem List   Diagnosis Date Noted  . Post-traumatic headache 05/05/2017  . Concussion 03/28/2017  . Whiplash 03/28/2017  . Hyperglycemia 03/28/2017  . Essential hypertension 09/01/2014  . Chronic pain syndrome 09/01/2014  . HTN (hypertension) 06/22/2013  . Depression (emotion) 06/22/2013  . Pain in joint, ankle and foot, left 06/22/2013  . Back pain 06/22/2013  . Denture stomatitis 06/22/2013  . Blurry vision 06/22/2013  . Left tibial fracture 08/17/2012  . Left fibular fracture 08/17/2012  . Acute bronchitis 08/17/2012  . Dizziness 01/24/2011  . Hyperlipidemia 07/27/2010  . Exertional dyspnea 06/06/2010  . Smoking 06/06/2010  . Hypertension 06/06/2010  . Leg pain 06/06/2010  . Tachycardia 06/06/2010     Laureen Abrahams, PT, DPT 05/09/17 10:57 AM    [facility] [place], [place], [address] Phone: [phone]   Fax:  [phone]  Name: Phillip Paul MRN: [id number] Date of Birth: [birth year]

## 2017-05-09 NOTE — Telephone Encounter (Signed)
Resent Diabetes kit with instruction per Brenda.

## 2017-05-09 NOTE — Patient Instructions (Signed)
WALKING  Walking is a great form of exercise to increase your strength, endurance and overall fitness.  A walking program can help you start slowly and gradually build endurance as you go.  Everyone's ability is different, so each person's starting point will be different.  You do not have to follow them exactly.  The are just samples. You should simply find out what's right for you and stick to that program.   In the beginning, you'll start off walking 2-3 times a day for short distances.  As you get stronger, you'll be walking further at just 1-2 times per day.  A. You Can Walk For A Certain Length Of Time Each Day    Walk 5 minutes 3 times per day.  Increase 2 minutes every 2 days (3 times per day).  Work up to 25-30 minutes (1-2 times per day).   Example:   Day 1-2 5 minutes 3 times per day   Day 7-8 12 minutes 2-3 times per day   Day 13-14 25 minutes 1-2 times per day  Stay close to home and make sure someone is available to call if you need assistance.

## 2017-05-09 NOTE — Telephone Encounter (Signed)
CMA sent the Diabetes strip and meter to the pharmacy [facility].

## 2017-05-12 ENCOUNTER — Encounter: Payer: Self-pay | Admitting: Neurology

## 2017-05-15 ENCOUNTER — Telehealth: Payer: Self-pay | Admitting: Nurse Practitioner

## 2017-05-15 NOTE — Telephone Encounter (Signed)
Patient called and requested for listed medication to be refilled and sent to listed pharmacy  Insulin Detemir (LEVEMIR FLEXTOUCH) 100 UNIT/ML Pen [[id number]]  metFORMIN (GLUCOPHAGE) 500 MG tablet [[id number]]  [facility] on [address]

## 2017-05-16 ENCOUNTER — Other Ambulatory Visit: Payer: Self-pay

## 2017-05-16 DIAGNOSIS — E1165 Type 2 diabetes mellitus with hyperglycemia: Secondary | ICD-10-CM

## 2017-05-16 MED ORDER — METFORMIN HCL 500 MG PO TABS: 500.0000 mg | ORAL_TABLET | Freq: Two times a day (BID) | ORAL | 3 refills | Status: DC

## 2017-05-16 MED ORDER — INSULIN DETEMIR 100 UNIT/ML FLEXPEN: 15.0000 [IU] | PEN_INJECTOR | Freq: Every day | SUBCUTANEOUS | 11 refills | Status: DC

## 2017-05-16 NOTE — Telephone Encounter (Signed)
CMA attempt to call patient to inform his Rx has been sent. Unable to leave VM due to no voicemail has been set up.

## 2017-05-16 NOTE — Progress Notes (Signed)
Patient called for a refill on Metformin and Levemir Flextouch pen. Rx sent.

## 2017-05-21 ENCOUNTER — Ambulatory Visit (INDEPENDENT_AMBULATORY_CARE_PROVIDER_SITE_OTHER): Payer: Medicare Other | Admitting: Physical Therapy

## 2017-05-21 ENCOUNTER — Encounter: Payer: Self-pay | Admitting: Physical Therapy

## 2017-05-21 DIAGNOSIS — R2681 Unsteadiness on feet: Secondary | ICD-10-CM

## 2017-05-21 DIAGNOSIS — R42 Dizziness and giddiness: Secondary | ICD-10-CM

## 2017-05-21 NOTE — Therapy (Signed)
[facility] [address] [place], [place], [address] Phone: [phone]   Fax:  [phone]  Physical Therapy Treatment  Patient Details  Name: Phillip Paul MRN: [id number] Date of Birth: August 01, 1950 Referring Provider: Dr Charlann Boxer   Encounter Date: 05/21/2017  PT End of Session - 05/21/17 1045    Visit Number  3    Number of Visits  8    Date for PT Re-Evaluation  06/20/17    Authorization Type  Medicare    PT Start Time  0935    PT Stop Time  1015    PT Time Calculation (min)  40 min    Activity Tolerance  Patient tolerated treatment well    Behavior During Therapy  [facility] for tasks assessed/performed       Past Medical History:  Diagnosis Date  . Abnormal EKG   . Anxiety   . Diabetes mellitus without complication ([place])   . Dizzy   . Hypertension   . Kidney stones   . Racing heart beat     Past Surgical History:  Procedure Laterality Date  . CYSTOSCOPY     LEFT URETEROSCOPIC STONE MANIPULATION AND REMOVAL; PLACEMENT OF  LEFT DOUBLE-J URETERAL STENT  . CYSTOSCOPY W/ URETERAL STENT PLACEMENT     LEFT DOUBLE-J URETERAL STENT  . ORIF ANKLE FRACTURE Left 08/17/2012   Procedure: OPEN REDUCTION INTERNAL FIXATION (ORIF) ANKLE FRACTURE/Left;  Surgeon: Wylene Simmer, MD;  Location: [facility];  Service: Orthopedics;  Laterality: Left;  . TIBIA IM NAIL INSERTION Left 08/17/2012   Procedure: INTRAMEDULLARY (IM) NAIL TIBIAL/Left;  Surgeon: Wylene Simmer, MD;  Location: [facility];  Service: Orthopedics;  Laterality: Left;    There were no vitals filed for this visit.  Subjective Assessment - 05/21/17 0937    Subjective  still having headaches and dizziness, but feels it is getting better.  trying to walk up to 15-20 min at a time.      Patient Stated Goals  improve dizziness, balance and headache    Currently in Pain?  Yes    Pain Score  6     Pain Location  Head    Pain Orientation  Left;Right    Pain Descriptors / Indicators   Headache    Pain Type  Chronic pain;Acute pain    Pain Onset  More than a month ago    Pain Frequency  Constant    Aggravating Factors   increased activity, watching TV    Pain Relieving Factors  lying down with eyes closed             Vestibular Assessment - 05/21/17 0001      Orthostatics   BP supine (x 5 minutes)  122/83    HR supine (x 5 minutes)  66    BP standing (after 1 minute)  108/78    HR standing (after 1 minute)  86    BP standing (after 3 minutes)  118/80    HR standing (after 3 minutes)  81               [facility] Adult PT Treatment/Exercise - 05/21/17 1029      Self-Care   Other Self-Care Comments   reinforced walking program and to conitnue increasing activity as symptoms allow.  recommended he discuss concerns about his symptoms of feeling as if he is going to "pass out" with MD      Vestibular Treatment/Exercise - 05/21/17 0867      Vestibular  Treatment/Exercise   Vestibular Treatment Provided  Gaze    Gaze Exercises  X1 Viewing Horizontal;X1 Viewing Vertical;Eye/Head Exercise Horizontal;Eye/Head Exercise Vertical      X1 Viewing Horizontal   Foot Position  feet apart    Time  -- 30 sec    Reps  1      X1 Viewing Vertical   Foot Position  feet apart    Time  -- 30 sec    Reps  1         Balance Exercises - 05/21/17 1000      Balance Exercises: Standing   Standing Eyes Closed  Foam/compliant surface;Narrow base of support (BOS);3 reps;10 secs;Head turns    Partial Tandem Stance  Eyes closed;Foam/compliant surface with horizontal/vertical head turns        PT Education - 05/21/17 1044    Education provided  Yes    Education Details  see self care, progressed gaze    Person(s) Educated  Patient    Methods  Explanation;Handout;Demonstration    Comprehension  Verbalized understanding;Returned demonstration;Need further instruction       PT Short Term Goals - 05/21/17 1047      PT SHORT TERM GOAL #1   Title  independent with  initial HEP    Status  New    Target Date  05/30/17 extended due to missed week      PT SHORT TERM GOAL #2   Title  improve FGA to >/= 25/30 for improved mobiltiy    Status  New    Target Date  05/30/17      PT SHORT TERM GOAL #3   Title  report 35% improvement in symptoms for improved function and mobility    Status  New    Target Date  05/30/17        PT Long Term Goals - 04/25/17 1153      PT LONG TERM GOAL #1   Title  independent with advanced HEP    Status  New    Target Date  06/20/17      PT LONG TERM GOAL #2   Title  report 70% improvement in symptoms    Status  New    Target Date  06/20/17      PT LONG TERM GOAL #3   Title  report ability to walk at least 1 mile without increase in symptoms for improved mobility and return to prior exercise    Status  New    Target Date  06/20/17      PT LONG TERM GOAL #4   Title  improve FGA to >/= 27/30 for improved balance and mobility    Status  New    Target Date  06/20/17            Plan - 05/21/17 1045    Clinical Impression Statement  Pt slowly progressing towards goals and able to progress HEP today as well as pt reporting increased activity/walking tolerance at home.  Continues to have headaches and isn't scheduled with neurologist until July 2019.    Rehab Potential  Good    PT Frequency  1x / week    PT Duration  8 weeks    PT Treatment/Interventions  ADLs/Self Care Home Management;Canalith Repostioning;Cryotherapy;Electrical Stimulation;Ultrasound;Moist Heat;Gait training;Functional mobility training;Stair training;Therapeutic activities;Therapeutic exercise;Balance training;Neuromuscular re-education;Patient/family education;Manual techniques;Vestibular;Taping;Dry needling;Visual/perceptual remediation/compensation    PT Next Visit Plan  review HEP, progress as able, habituation and balance/dynamic gait tasks, check STGs    PT Home Exercise Plan  Access Code: [email or web address]    Consulted and Agree with Plan of  Care  Patient       Patient will benefit from skilled therapeutic intervention in order to improve the following deficits and impairments:  Abnormal gait, Dizziness, Pain, Decreased activity tolerance, Decreased balance, Impaired vision/preception  Visit Diagnosis: Dizziness and giddiness  Unsteadiness on feet     Problem List Patient Active Problem List   Diagnosis Date Noted  . Post-traumatic headache 05/05/2017  . Concussion 03/28/2017  . Whiplash 03/28/2017  . Hyperglycemia 03/28/2017  . Essential hypertension 09/01/2014  . Chronic pain syndrome 09/01/2014  . HTN (hypertension) 06/22/2013  . Depression (emotion) 06/22/2013  . Pain in joint, ankle and foot, left 06/22/2013  . Back pain 06/22/2013  . Denture stomatitis 06/22/2013  . Blurry vision 06/22/2013  . Left tibial fracture 08/17/2012  . Left fibular fracture 08/17/2012  . Acute bronchitis 08/17/2012  . Dizziness 01/24/2011  . Hyperlipidemia 07/27/2010  . Exertional dyspnea 06/06/2010  . Smoking 06/06/2010  . Hypertension 06/06/2010  . Leg pain 06/06/2010  . Tachycardia 06/06/2010      Laureen Abrahams, PT, DPT 05/21/17 10:48 AM    [facility] [place], [place], [address] Phone: [phone]   Fax:  [phone]  Name: Phillip Paul MRN: [id number] Date of Birth: 19-Apr-1950

## 2017-05-21 NOTE — Patient Instructions (Signed)
Access Code: [email or web address]  URL: [email or web address]  Date: 05/21/2017  Prepared by: Faustino Congress   Exercises  Standing Gaze Stabilization with Head Rotation - 1 sets - 30 Seconds - 2x daily - 7x weekly  Forward Walking with Head Rotations - 1-2 reps - 1 sets - 20 Feet - 3x daily - 7x weekly  Romberg Stance Eyes Closed on Foam Pad - 5 reps - 1 sets - 10-15 sec hold - 2x daily - 7x weekly  Narrow Stance with Eyes Closed and Head Rotation on Foam Pad - 10 reps - 1 sets - 2x daily - 7x weekly

## 2017-06-12 ENCOUNTER — Ambulatory Visit: Payer: Medicare Other

## 2017-06-20 ENCOUNTER — Ambulatory Visit: Payer: Medicare Other | Admitting: Endocrinology

## 2017-06-30 ENCOUNTER — Ambulatory Visit: Payer: Medicare Other | Admitting: Registered"

## 2017-07-18 ENCOUNTER — Telehealth: Payer: Self-pay | Admitting: Nurse Practitioner

## 2017-07-18 ENCOUNTER — Other Ambulatory Visit: Payer: Self-pay

## 2017-07-18 MED ORDER — INSULIN PEN NEEDLE 31G X 5 MM MISC: 12 refills | Status: DC

## 2017-07-18 NOTE — Telephone Encounter (Signed)
Rx sent 

## 2017-07-18 NOTE — Telephone Encounter (Signed)
Pt. Is asking for pen needles for his insulin.

## 2017-07-18 NOTE — Telephone Encounter (Signed)
Pt called to request a refill on his insulin needles To - [facility], [address] Please follow up

## 2017-07-23 ENCOUNTER — Encounter: Payer: Self-pay | Admitting: Neurology

## 2017-07-23 ENCOUNTER — Ambulatory Visit (INDEPENDENT_AMBULATORY_CARE_PROVIDER_SITE_OTHER): Payer: Medicare Other | Admitting: Neurology

## 2017-07-23 VITALS — BP 120/72 | HR 78 | Ht 67.5 in | Wt 196.0 lb

## 2017-07-23 DIAGNOSIS — G44229 Chronic tension-type headache, not intractable: Secondary | ICD-10-CM | POA: Diagnosis not present

## 2017-07-23 DIAGNOSIS — F17200 Nicotine dependence, unspecified, uncomplicated: Secondary | ICD-10-CM

## 2017-07-23 DIAGNOSIS — F0781 Postconcussional syndrome: Secondary | ICD-10-CM | POA: Diagnosis not present

## 2017-07-23 DIAGNOSIS — F17210 Nicotine dependence, cigarettes, uncomplicated: Secondary | ICD-10-CM

## 2017-07-23 MED ORDER — NORTRIPTYLINE HCL 25 MG PO CAPS
25.0000 mg | ORAL_CAPSULE | Freq: Every day | ORAL | 3 refills | Status: DC
Start: 2017-07-23 — End: 2017-11-12

## 2017-07-23 NOTE — Patient Instructions (Signed)
1.  To help reduce frequency of headaches, as well as to help sleep, start nortriptyline 25mg  at bedtime.  If headaches are not improved in 4 weeks, contact me and we can increase dose. 2.  Limit use of Aleve to no more than 2 days out of week to prevent rebound headache 3.  Keep headache diary to document progress 4.  Follow sleep hygiene instructions. 5.  Follow up in 3 months
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
## 2017-08-06 ENCOUNTER — Other Ambulatory Visit: Payer: Medicare Other

## 2017-08-06 ENCOUNTER — Ambulatory Visit: Payer: Medicare Other | Admitting: Nurse Practitioner

## 2017-08-06 ENCOUNTER — Encounter: Payer: Self-pay | Admitting: Nurse Practitioner

## 2017-08-06 ENCOUNTER — Ambulatory Visit: Payer: Medicare Other | Attending: Nurse Practitioner | Admitting: Nurse Practitioner

## 2017-08-06 VITALS — BP 143/91 | HR 108 | Temp 98.4°F | Ht 70.0 in | Wt 194.6 lb

## 2017-08-06 DIAGNOSIS — I10 Essential (primary) hypertension: Secondary | ICD-10-CM | POA: Diagnosis not present

## 2017-08-06 DIAGNOSIS — F419 Anxiety disorder, unspecified: Secondary | ICD-10-CM | POA: Diagnosis not present

## 2017-08-06 DIAGNOSIS — Z87442 Personal history of urinary calculi: Secondary | ICD-10-CM | POA: Insufficient documentation

## 2017-08-06 DIAGNOSIS — Z1211 Encounter for screening for malignant neoplasm of colon: Secondary | ICD-10-CM | POA: Diagnosis not present

## 2017-08-06 DIAGNOSIS — Z79899 Other long term (current) drug therapy: Secondary | ICD-10-CM | POA: Diagnosis not present

## 2017-08-06 DIAGNOSIS — E1165 Type 2 diabetes mellitus with hyperglycemia: Secondary | ICD-10-CM | POA: Diagnosis not present

## 2017-08-06 DIAGNOSIS — R42 Dizziness and giddiness: Secondary | ICD-10-CM | POA: Insufficient documentation

## 2017-08-06 DIAGNOSIS — Z23 Encounter for immunization: Secondary | ICD-10-CM | POA: Insufficient documentation

## 2017-08-06 DIAGNOSIS — Z794 Long term (current) use of insulin: Secondary | ICD-10-CM | POA: Insufficient documentation

## 2017-08-06 LAB — GLUCOSE, POCT (MANUAL RESULT ENTRY): POC Glucose: 147 mg/dl — AB (ref 70–99)

## 2017-08-06 MED ORDER — FLUTICASONE PROPIONATE 50 MCG/ACT NA SUSP: 2.0000 | Freq: Every day | NASAL | 6 refills | Status: DC

## 2017-08-06 MED ORDER — LOSARTAN POTASSIUM 100 MG PO TABS: 100.0000 mg | ORAL_TABLET | Freq: Every day | ORAL | 0 refills | Status: DC

## 2017-08-06 MED ORDER — MECLIZINE HCL 12.5 MG PO TABS: 12.5000 mg | ORAL_TABLET | Freq: Three times a day (TID) | ORAL | 0 refills | Status: DC | PRN

## 2017-08-06 MED ORDER — AMLODIPINE BESYLATE 10 MG PO TABS: 10.0000 mg | ORAL_TABLET | Freq: Every day | ORAL | 1 refills | Status: DC

## 2017-08-06 MED ORDER — METFORMIN HCL 500 MG PO TABS: 500.0000 mg | ORAL_TABLET | Freq: Two times a day (BID) | ORAL | 3 refills | Status: DC

## 2017-08-06 NOTE — Patient Instructions (Signed)
Dizziness Dizziness is a common problem. It makes you feel unsteady or light-headed. You may feel like you are about to pass out (faint). Dizziness can lead to getting hurt if you stumble or fall. Dizziness can be caused by many things, including:  Medicines.  Not having enough water in your body (dehydration).  Illness.  Follow these instructions at home: Eating and drinking  Drink enough fluid to keep your pee (urine) clear or pale yellow. This helps to keep you from getting dehydrated. Try to drink more clear fluids, such as water.  Do not drink alcohol.  Limit how much caffeine you drink or eat, if your doctor tells you to do that.  Limit how much salt (sodium) you drink or eat, if your doctor tells you to do that. Activity  Avoid making quick movements. ? When you stand up from sitting in a chair, steady yourself until you feel okay. ? In the morning, first sit up on the side of the bed. When you feel okay, stand slowly while you hold onto something. Do this until you know that your balance is fine.  If you need to stand in one place for a long time, move your legs often. Tighten and relax the muscles in your legs while you are standing.  Do not drive or use heavy machinery if you feel dizzy.  Avoid bending down if you feel dizzy. Place items in your home so you can reach them easily without leaning over. Lifestyle  Do not use any products that contain nicotine or tobacco, such as cigarettes and e-cigarettes. If you need help quitting, ask your doctor.  Try to lower your stress level. You can do this by using methods such as yoga or meditation. Talk with your doctor if you need help. General instructions  Watch your dizziness for any changes.  Take over-the-counter and prescription medicines only as told by your doctor. Talk with your doctor if you think that you are dizzy because of a medicine that you are taking.  Tell a friend or a family member that you are feeling  dizzy. If he or she notices any changes in your behavior, have this person call your doctor.  Keep all follow-up visits as told by your doctor. This is important. Contact a doctor if:  Your dizziness does not go away.  Your dizziness or light-headedness gets worse.  You feel sick to your stomach (nauseous).  You have trouble hearing.  You have new symptoms.  You are unsteady on your feet.  You feel like the room is spinning. Get help right away if:  You throw up (vomit) or have watery poop (diarrhea), and you cannot eat or drink anything.  You have trouble: ? Talking. ? Walking. ? Swallowing. ? Using your arms, hands, or legs.  You feel generally weak.  You are not thinking clearly, or you have trouble forming sentences. A friend or family member may notice this.  You have: ? Chest pain. ? Pain in your belly (abdomen). ? Shortness of breath. ? Sweating.  Your vision changes.  You are bleeding.  You have a very bad headache.  You have neck pain or a stiff neck.  You have a fever. These symptoms may be an emergency. Do not wait to see if the symptoms will go away. Get medical help right away. Call your local emergency services (911 in the [place] Do not drive yourself to the hospital. Summary  Dizziness makes you feel unsteady or light-headed. You may  feel like you are about to pass out (faint).  Drink enough fluid to keep your pee (urine) clear or pale yellow. Do not drink alcohol.  Avoid making quick movements if you feel dizzy.  Watch your dizziness for any changes. This information is not intended to replace advice given to you by your health care provider. Make sure you discuss any questions you have with your health care provider. Document Released: 12/20/2010 Document Revised: 01/18/2016 Document Reviewed: 01/18/2016 Elsevier Interactive Patient Education  2017 [employer]  Meniere Disease Meniere disease is an inner ear disorder. It causes attacks  of a spinning sensation (vertigo), dizziness, and ringing in the ear (tinnitus). It also causes hearing loss and a feeling of fullness or pressure in the ear. This is a lifelong condition, and it may get worse over time. You may have drop attacks or severe dizziness that makes you fall. A drop attack is when you suddenly fall without losing consciousness and you quickly recover after a few seconds or minutes. What are the causes? This condition is caused by having too much of the fluid that is in your inner ear (endolymph). When fluid builds up in your inner ear, it affects the nerves that control balance and hearing. The reason for the fluid buildup is not known. Possible causes include:  Allergies.  An abnormal reaction of the body's defense system (autoimmune disease).  Viral infection of the inner ear.  Head injury.  What increases the risk? You are more likely to develop this condition if:  You are older than age 45.  You have a family history of Meniere disease.  You have a history of autoimmune disease.  You have a history of migraine headaches.  What are the signs or symptoms? Symptoms of this condition can come and go and may last for up to 4 hours at a time. Symptoms usually start in one ear. They may become more frequent and eventually involve both ears. Symptoms can include:  Fullness and pressure in your ear.  Roaring or ringing in your ear.  Vertigo and loss of balance.  Dizziness.  Decreased hearing.  Nausea and vomiting.  How is this diagnosed? This condition is diagnosed based on:  A physical exam.  Tests , such as: ? A hearing test (audiogram). ? An electronystagmogram. This tests your balance nerve (vestibular nerve). ? Imaging studies of your inner ear, such as CT scan or MRI. ? Other balance tests, such as rotational or balance platform tests.  How is this treated? There is no cure for this condition, but treatment can help to manage your  symptoms. Treatment may include:  A low-salt diet. Limiting salt may help to reduce fluid in the body and relieve symptoms.  Oral or injected medicines to reduce or control: ? Vertigo. ? Nausea. ? Fluid retention. ? Dizziness.  Use of an air pressure pulse generator. This is a machine that sends small pressure pulses into your ear canal.  Hearing aids.  Inner ear surgery. This is rare.  When you have symptoms, it can be helpful to lie down on a flat surface and focus your eyes on one object that does not move. Try to stay in that position until your symptoms go away. Follow these instructions at home: Eating and drinking  Eat the same amount of food at the same time every day, including snacks.  Do not skip meals.  Avoid caffeine.  Drink enough fluids to keep your urine clear or pale yellow.  Limit  alcoholic drinks to one drink a day for non-pregnant women and 2 drinks a day for men. One drink equals 12 oz of beer, 5 oz of wine, or 1 oz of hard liquor.  Limit the salt (sodium) in your diet as told by your health care provider. Check ingredients and nutrition facts on packaged foods and beverages.  Do not eat foods that contain monosodium glutamate (MSG). General instructions  Do not use any products that contain nicotine or tobacco, such as cigarettes and e-cigarettes. If you need help quitting, ask your health care provider.  Take over-the-counter and prescription medicines only as told by your health care provider.  Find ways to reduce or avoid stress. If you need help with this, ask your health care provider.  Do not drive if you have vertigo or dizziness. Contact a health care provider if:  You have symptoms that last longer than 4 hours.  You have new or worse symptoms. Get help right away if:  You have been vomiting for 24 hours.  You cannot keep fluids down.  You have chest pain or trouble breathing. Summary  Meniere disease is an inner ear disorder. It  causes attacks of a spinning sensation (vertigo), dizziness, and ringing in the ear (tinnitus). It also causes hearing loss and a feeling of fullness or pressure in the ear.  Symptoms of this condition can come and go and may last for up to 4 hours at a time.  When you have symptoms, it can be helpful to lie down on a flat surface and focus your eyes on one object that does not move. Try to stay in that position until your symptoms go away. This information is not intended to replace advice given to you by your health care provider. Make sure you discuss any questions you have with your health care provider. Document Released: 12/29/1999 Document Revised: 11/22/2015 Document Reviewed: 11/22/2015 Elsevier Interactive Patient Education  2017 [employer].

## 2017-08-06 NOTE — Progress Notes (Signed)
Assessment & Plan:  Phillip Paul was seen today for follow-up.  Diagnoses and all orders for this visit:  Uncontrolled type 2 diabetes mellitus with hyperglycemia (HCC) -     Glucose (CBG) -     metFORMIN (GLUCOPHAGE) 500 MG tablet; Take 1 tablet (500 mg total) by mouth 2 (two) times daily with a meal. -     Ambulatory referral to Ophthalmology -     Microalbumin/Creatinine Ratio, Urine -     CMP14+EGFR -     Hemoglobin A1c -     Lipid panel Continue blood sugar control as discussed in office today, low carbohydrate diet, and regular physical exercise as tolerated, 150 minutes per week (30 min each day, 5 days per week, or 50 min 3 days per week). Keep blood sugar logs with fasting goal of 90-130 mg/dl, post prandial (after you eat) less than 180.  For Hypoglycemia: BS <60 and Hyperglycemia BS >400; contact the clinic ASAP. Annual eye exams and foot exams are recommended.   Essential hypertension -     amLODipine (NORVASC) 10 MG tablet; Take 1 tablet (10 mg total) by mouth daily. -     losartan (COZAAR) 100 MG tablet; Take 1 tablet (100 mg total) by mouth daily. Continue all antihypertensives as prescribed.  Remember to bring in your blood pressure log with you for your follow up appointment.  DASH/Mediterranean Diets are healthier choices for HTN.   Vertigo -     PT vestibular rehab; Future -     meclizine (ANTIVERT) 12.5 MG tablet; Take 1 tablet (12.5 mg total) by mouth 3 (three) times daily as needed for dizziness. -     fluticasone (FLONASE) 50 MCG/ACT nasal spray; Place 2 sprays into both nostrils daily.  Colon cancer screening -     Fecal occult blood, imunochemical([facility])  Immunization due -     Pneumococcal conjugate vaccine 13-valent IM    Patient has been counseled on age-appropriate routine health concerns for screening and prevention. These are reviewed and up-to-date. Referrals have been placed accordingly. Immunizations are up-to-date or declined.     Subjective:   Chief Complaint  Patient presents with  . Follow-up    Patient is here to follow-up on diabetes. Pt. stated he has been feeling dizzy lately, he just got put on Nortriptyline to help his headache from his neurologist.    HPI Phillip Paul 67 y.o. male presents to office today for follow up.  Essential Hypertension Chronic. Not well controlled today however patient has been cutting his medication in half to make it last until his office appointment. He was not aware that he had refills and only needed to call the pharmacy to have his medications refilled. Denies chest pain, shortness of breath, palpitations, lightheadedness, dizziness, headaches or BLE edema. He endorses medication compliance taking amlodipine 68m and losartan 1071mdaily.  BP Readings from Last 3 Encounters:  08/06/17 (!) 143/91  07/23/17 120/72  05/07/17 117/82    Type 2 Diabetes Mellitus Disease course is currently stable. Awaiting A1c results.  There are no hypoglycemic symptoms. There are no hypoglycemic complications. Symptoms are stable. There are no diabetic complications. Risk factors for coronary artery disease include diabetes mellitus, hypertension, sedentary lifestyle. Current diabetic treatment includes metformin 50066mID, Levemir 15 units daily. Patient is compliant with treatment all of the time and monitors blood glucose at home 2 times per day.   Home blood glucose trend : (FBS 90-130m52m)  Weight is not stable and is  gradually increasing. Patient follows a generally healthy diet. Meal planning includes avoidance of concentrated sweets. Patient has not seen a dietician. Patient is compliant with exercise.   An ACE inhibitor/angiotensin II receptor blocker is being taken. Patient does not see a podiatrist. Eye exam is not current.    Vertigo - Dizziness Patient presents with dizziness .  The dizziness has been present for "a long time".  The patient describes the symptoms as  disequalibirum. Symptoms are exacerbated by rapid head movements, rolling over in bed, rising from supine position, rising from squatting or sitting position, bending and motion The patient also complains of none. Patient denies aural pressure otalgia otorrhea tinnitus hearing loss.  He has been treated with nothing. Previous work up has been NON.  Review of Systems  Constitutional: Negative for fever, malaise/fatigue and weight loss.  HENT: Negative.  Negative for nosebleeds.   Eyes: Negative.  Negative for blurred vision, double vision and photophobia.  Respiratory: Positive for sputum production (congestion). Negative for cough and shortness of breath.   Cardiovascular: Negative.  Negative for chest pain, palpitations and leg swelling.  Gastrointestinal: Negative.  Negative for heartburn, nausea and vomiting.  Musculoskeletal: Negative.  Negative for myalgias.  Neurological: Positive for dizziness. Negative for focal weakness, seizures and headaches.  Psychiatric/Behavioral: Negative.  Negative for suicidal ideas.    Past Medical History:  Diagnosis Date  . Abnormal EKG   . Anxiety   . Diabetes mellitus without complication ([place])   . Dizzy   . Hypertension   . Kidney stones   . Racing heart beat     Past Surgical History:  Procedure Laterality Date  . CYSTOSCOPY     LEFT URETEROSCOPIC STONE MANIPULATION AND REMOVAL; PLACEMENT OF  LEFT DOUBLE-J URETERAL STENT  . CYSTOSCOPY W/ URETERAL STENT PLACEMENT     LEFT DOUBLE-J URETERAL STENT  . ORIF ANKLE FRACTURE Left 08/17/2012   Procedure: OPEN REDUCTION INTERNAL FIXATION (ORIF) ANKLE FRACTURE/Left;  Surgeon: Wylene Simmer, MD;  Location: [facility];  Service: Orthopedics;  Laterality: Left;  . TIBIA IM NAIL INSERTION Left 08/17/2012   Procedure: INTRAMEDULLARY (IM) NAIL TIBIAL/Left;  Surgeon: Wylene Simmer, MD;  Location: [facility];  Service: Orthopedics;  Laterality: Left;    Family History  Problem Relation Age of Onset  . Hypertension  Father        lived to 42  . Other Brother        MVA  . Kidney disease Sister   . Other Brother        Stomach problems    Social History Reviewed with no changes to be made today.   Outpatient Medications Prior to Visit  Medication Sig Dispense Refill  . Blood Glucose Monitoring Suppl (TRUE METRIX METER) w/Device KIT Check blood sugars three times per day 1 kit 0  . cholecalciferol (VITAMIN D) 1000 units tablet Take 1,000 Units by mouth daily.    . fish oil-omega-3 fatty acids 1000 MG capsule Take 1 g by mouth daily.    Marland Kitchen glucose blood (TRUE METRIX BLOOD GLUCOSE TEST) test strip Check blood sugar 3x a day. 100 each 12  . Insulin Detemir (LEVEMIR FLEXTOUCH) 100 UNIT/ML Pen Inject 15 Units into the skin daily at 10 pm. 15 mL 11  . Insulin Pen Needle 31G X 5 MM MISC Use as instructed. 100 each 12  . nortriptyline (PAMELOR) 25 MG capsule Take 1 capsule (25 mg total) by mouth at bedtime. 30 capsule 3  . TRUEPLUS LANCETS 28G MISC Use  as instructed 100 each 3  . amLODipine (NORVASC) 10 MG tablet Take 1 tablet (10 mg total) by mouth daily. 90 tablet 1  . losartan (COZAAR) 100 MG tablet Take 1 tablet (100 mg total) by mouth daily. 90 tablet 0  . metFORMIN (GLUCOPHAGE) 500 MG tablet Take 1 tablet (500 mg total) by mouth 2 (two) times daily with a meal. 60 tablet 3   No facility-administered medications prior to visit.     No Known Allergies     Objective:    BP (!) 143/91 (BP Location: Right Arm, Patient Position: Sitting, Cuff Size: Normal)   Pulse (!) 108   Temp 98.4 F (36.9 C) (Oral)   Ht 5' 10"  (1.778 m)   Wt 194 lb 9.6 oz (88.3 kg)   SpO2 92%   BMI 27.92 kg/m  Wt Readings from Last 3 Encounters:  08/06/17 194 lb 9.6 oz (88.3 kg)  07/23/17 196 lb (88.9 kg)  05/07/17 176 lb 3.2 oz (79.9 kg)    Physical Exam  Constitutional: He is oriented to person, place, and time. He appears well-developed and well-nourished. He is cooperative.  HENT:  Head: Normocephalic and  atraumatic.  Eyes: EOM are normal.  Neck: Normal range of motion.  Cardiovascular: Regular rhythm, normal heart sounds and intact distal pulses. Tachycardia present. Exam reveals no gallop and no friction rub.  No murmur heard. Pulmonary/Chest: Effort normal and breath sounds normal. No tachypnea. No respiratory distress. He has no decreased breath sounds. He has no wheezes. He has no rhonchi. He has no rales. He exhibits no tenderness.  Abdominal: Bowel sounds are normal.  Musculoskeletal: Normal range of motion. He exhibits no edema.  Neurological: He is alert and oriented to person, place, and time. Coordination and gait normal.  Skin: Skin is warm and dry.  Psychiatric: He has a normal mood and affect. His behavior is normal. Judgment and thought content normal.  Nursing note and vitals reviewed.      Patient has been counseled extensively about nutrition and exercise as well as the importance of adherence with medications and regular follow-up. The patient was given clear instructions to go to ER or return to medical center if symptoms don't improve, worsen or new problems develop. The patient verbalized understanding.   Follow-up: Return in about 3 weeks (around 08/27/2017) for Physical and f/u congestion.   Gildardo Pounds, FNP-BC [facility] [place], [place]   08/06/2017, 10:18 AM

## 2017-08-07 LAB — CMP14+EGFR
A/G RATIO: 1.2 (ref 1.2–2.2)
ALK PHOS: 83 IU/L (ref 39–117)
ALT: 85 IU/L — AB (ref 0–44)
AST: 37 IU/L (ref 0–40)
Albumin: 4.2 g/dL (ref 3.6–4.8)
BUN/Creatinine Ratio: 27 — ABNORMAL HIGH (ref 10–24)
BUN: 18 mg/dL (ref 8–27)
CALCIUM: 9.8 mg/dL (ref 8.6–10.2)
CHLORIDE: 99 mmol/L (ref 96–106)
CO2: 21 mmol/L (ref 20–29)
Creatinine, Ser: 0.66 mg/dL — ABNORMAL LOW (ref 0.76–1.27)
GFR calc Af Amer: 116 mL/min/{1.73_m2} (ref >59)
GFR, EST NON AFRICAN AMERICAN: 100 mL/min/{1.73_m2} (ref >59)
GLOBULIN, TOTAL: 3.4 g/dL (ref 1.5–4.5)
Glucose: 176 mg/dL — ABNORMAL HIGH (ref 65–99)
Potassium: 4.4 mmol/L (ref 3.5–5.2)
SODIUM: 137 mmol/L (ref 134–144)
Total Protein: 7.6 g/dL (ref 6.0–8.5)

## 2017-08-07 LAB — MICROALBUMIN / CREATININE URINE RATIO
CREATININE, UR: 60.6 mg/dL
MICROALB/CREAT RATIO: 13.2 mg/g{creat} (ref 0.0–30.0)
Microalbumin, Urine: 8 ug/mL

## 2017-08-07 LAB — LIPID PANEL
CHOLESTEROL TOTAL: 147 mg/dL (ref 100–199)
Chol/HDL Ratio: 3.1 ratio (ref 0.0–5.0)
HDL: 48 mg/dL (ref >39)
LDL Calculated: 76 mg/dL (ref 0–99)
Triglycerides: 116 mg/dL (ref 0–149)
VLDL CHOLESTEROL CAL: 23 mg/dL (ref 5–40)

## 2017-08-07 LAB — HEMOGLOBIN A1C
ESTIMATED AVERAGE GLUCOSE: 160 mg/dL
HEMOGLOBIN A1C: 7.2 % — AB (ref 4.8–5.6)

## 2017-08-12 ENCOUNTER — Telehealth: Payer: Self-pay

## 2017-08-12 NOTE — Telephone Encounter (Signed)
CMA spoke to patient to inform on lab results.  Patient verified DOB. Patient understood.  

## 2017-08-12 NOTE — Telephone Encounter (Signed)
-----   Message from Gildardo Pounds, NP sent at 08/07/2017 11:03 PM EDT ----- A1c has decreased from 17.3 to 7.2!!! You are doing a great job. The goal is less than 7 and you are almost there. If your blood sugars readings at home are below 90 consistently I need you to stop the lantus and continue the metformin only.

## 2017-08-20 ENCOUNTER — Telehealth: Payer: Self-pay | Admitting: Neurology

## 2017-08-20 ENCOUNTER — Other Ambulatory Visit: Payer: Self-pay | Admitting: Nurse Practitioner

## 2017-08-20 DIAGNOSIS — R195 Other fecal abnormalities: Secondary | ICD-10-CM

## 2017-08-20 DIAGNOSIS — G44229 Chronic tension-type headache, not intractable: Secondary | ICD-10-CM

## 2017-08-20 LAB — FECAL OCCULT BLOOD, IMMUNOCHEMICAL: FECAL OCCULT BLD: POSITIVE — AB

## 2017-08-20 MED ORDER — NORTRIPTYLINE HCL 25 MG PO CAPS
50.0000 mg | ORAL_CAPSULE | Freq: Every day | ORAL | 3 refills | Status: DC
Start: 2017-08-20 — End: 2017-11-12

## 2017-08-20 NOTE — Telephone Encounter (Signed)
Called and spoke with Pt. Advised him to increase the nortriptyline to 50 mg QHS, and I will send in new Rx to [facility].

## 2017-08-20 NOTE — Telephone Encounter (Signed)
Patient called stating he was told to let Dr. Tomi Likens know if the medication Nortriptyline 25mg  wasn't working. The medication is not helping and was wanting to up the dose to 50mg . He is going to [facility] [facility]. Please call him back [phone]. Thanks.

## 2017-08-21 ENCOUNTER — Telehealth: Payer: Self-pay

## 2017-08-21 NOTE — Telephone Encounter (Signed)
CMA spoke to patient to inform on lab results and PCP advising.  Patient understood. Patient verified DOB.

## 2017-08-21 NOTE — Telephone Encounter (Signed)
-----   Message from Gildardo Pounds, NP sent at 08/20/2017 10:55 AM EDT ----- Fecal occult blood test was positive. Will need to refer you to GI for additional work up. They will call you to schedule

## 2017-09-08 ENCOUNTER — Ambulatory Visit: Payer: Medicare Other | Attending: Nurse Practitioner | Admitting: Nurse Practitioner

## 2017-09-08 ENCOUNTER — Encounter: Payer: Self-pay | Admitting: Nurse Practitioner

## 2017-09-08 VITALS — BP 146/82 | HR 85 | Temp 98.0°F | Ht 70.0 in | Wt 206.8 lb

## 2017-09-08 DIAGNOSIS — G4700 Insomnia, unspecified: Secondary | ICD-10-CM | POA: Diagnosis not present

## 2017-09-08 DIAGNOSIS — F419 Anxiety disorder, unspecified: Secondary | ICD-10-CM | POA: Insufficient documentation

## 2017-09-08 DIAGNOSIS — Z79899 Other long term (current) drug therapy: Secondary | ICD-10-CM | POA: Diagnosis not present

## 2017-09-08 DIAGNOSIS — I10 Essential (primary) hypertension: Secondary | ICD-10-CM | POA: Diagnosis not present

## 2017-09-08 DIAGNOSIS — R7989 Other specified abnormal findings of blood chemistry: Secondary | ICD-10-CM | POA: Diagnosis not present

## 2017-09-08 DIAGNOSIS — R42 Dizziness and giddiness: Secondary | ICD-10-CM | POA: Diagnosis not present

## 2017-09-08 DIAGNOSIS — F17200 Nicotine dependence, unspecified, uncomplicated: Secondary | ICD-10-CM | POA: Insufficient documentation

## 2017-09-08 DIAGNOSIS — Z0000 Encounter for general adult medical examination without abnormal findings: Secondary | ICD-10-CM | POA: Diagnosis not present

## 2017-09-08 DIAGNOSIS — R05 Cough: Secondary | ICD-10-CM | POA: Diagnosis not present

## 2017-09-08 DIAGNOSIS — Z841 Family history of disorders of kidney and ureter: Secondary | ICD-10-CM | POA: Insufficient documentation

## 2017-09-08 DIAGNOSIS — Z8249 Family history of ischemic heart disease and other diseases of the circulatory system: Secondary | ICD-10-CM | POA: Diagnosis not present

## 2017-09-08 DIAGNOSIS — E1165 Type 2 diabetes mellitus with hyperglycemia: Secondary | ICD-10-CM | POA: Insufficient documentation

## 2017-09-08 DIAGNOSIS — R945 Abnormal results of liver function studies: Secondary | ICD-10-CM

## 2017-09-08 DIAGNOSIS — R140 Abdominal distension (gaseous): Secondary | ICD-10-CM | POA: Diagnosis not present

## 2017-09-08 DIAGNOSIS — Z9889 Other specified postprocedural states: Secondary | ICD-10-CM | POA: Diagnosis not present

## 2017-09-08 DIAGNOSIS — Z23 Encounter for immunization: Secondary | ICD-10-CM | POA: Diagnosis not present

## 2017-09-08 DIAGNOSIS — Z794 Long term (current) use of insulin: Secondary | ICD-10-CM | POA: Diagnosis not present

## 2017-09-08 LAB — GLUCOSE, POCT (MANUAL RESULT ENTRY): POC GLUCOSE: 208 mg/dL — AB (ref 70–99)

## 2017-09-08 MED ORDER — LOSARTAN POTASSIUM 100 MG PO TABS: 100.0000 mg | ORAL_TABLET | Freq: Every day | ORAL | 0 refills | Status: DC

## 2017-09-08 MED ORDER — TRAZODONE HCL 50 MG PO TABS: 50.0000 mg | ORAL_TABLET | Freq: Every day | ORAL | 2 refills | Status: DC

## 2017-09-08 MED ORDER — PRAVASTATIN SODIUM 20 MG PO TABS: 20.0000 mg | ORAL_TABLET | Freq: Every day | ORAL | 3 refills | Status: DC

## 2017-09-08 NOTE — Progress Notes (Signed)
Assessment & Plan:  Phillip Paul was seen today for annual exam.  Diagnoses and all orders for this visit:  Encounter for routine history and physical exam for male Next Physical in one year  Uncontrolled type 2 diabetes mellitus with hyperglycemia ([place]) -     Glucose (CBG) Chronic and stable.  Continue blood sugar control as discussed in office today, low carbohydrate diet, and regular physical exercise as tolerated, 150 minutes per week (30 min each day, 5 days per week, or 50 min 3 days per week). Keep blood sugar logs with fasting goal of 90-130 mg/dl, post prandial (after you eat) less than 180.  For Hypoglycemia: BS <60 and Hyperglycemia BS >400; contact the clinic ASAP. Annual eye exams and foot exams are recommended. Lab Results  Component Value Date   HGBA1C 7.2 (H) 08/06/2017   Essential hypertension -     losartan (COZAAR) 100 MG tablet; Take 1 tablet (100 mg total) by mouth daily. Continue all antihypertensives as prescribed.  Remember to bring in your blood pressure log with you for your follow up appointment.  DASH/Mediterranean Diets are healthier choices for HTN.  BP Readings from Last 3 Encounters:  09/08/17 (!) 146/82  08/06/17 (!) 143/91  07/23/17 120/72    Abdominal distension -     Lipase  Elevated LFTs -     Hepatic Function Panel  Dizziness of unknown cause -     PT vestibular rehab; Future  Insomnia, unspecified type -     traZODone (DESYREL) 50 MG tablet; Take 1 tablet (50 mg total) by mouth at bedtime.  Need for immunization against influenza -     Flu Vaccine QUAD 36+ mos IM   Patient has been counseled on age-appropriate routine health concerns for screening and prevention. These are reviewed and up-to-date. Referrals have been placed accordingly. Immunizations are up-to-date or declined.    Subjective:   Chief Complaint  Patient presents with  . Annual Exam    Pt. is here for a physical. Pt. stated his congestion is still ongoing.     HPI Phillip Paul 66 y.o. male presents to office today for annual physical. He continues to endorse morning congestion and productive cough that clears within an hour or 2 after he wakes up and goes about his day.  I have encouraged him on several occasions to stop smoking as this is likely causing his symptoms. He continues to smoke.   Review of Systems  Constitutional: Negative for fever, malaise/fatigue and weight loss.  HENT: Positive for congestion. Negative for nosebleeds.   Eyes: Negative.  Negative for blurred vision, double vision and photophobia.  Respiratory: Positive for cough. Negative for shortness of breath.   Cardiovascular: Negative.  Negative for chest pain, palpitations and leg swelling.  Gastrointestinal: Negative.  Negative for heartburn, nausea and vomiting.  Genitourinary: Negative.   Musculoskeletal: Negative.  Negative for myalgias.  Skin: Negative.   Neurological: Positive for dizziness (His previous referral for vestibular rehab was closed. I am not sure why. Will place referral today. ). Negative for focal weakness, seizures and headaches.  Endo/Heme/Allergies: Negative.   Psychiatric/Behavioral: Negative.  Negative for suicidal ideas.    Past Medical History:  Diagnosis Date  . Abnormal EKG   . Anxiety   . Diabetes mellitus without complication ([place])   . Dizzy   . Hypertension   . Kidney stones   . Racing heart beat     Past Surgical History:  Procedure Laterality Date  .  CYSTOSCOPY     LEFT URETEROSCOPIC STONE MANIPULATION AND REMOVAL; PLACEMENT OF  LEFT DOUBLE-J URETERAL STENT  . CYSTOSCOPY W/ URETERAL STENT PLACEMENT     LEFT DOUBLE-J URETERAL STENT  . ORIF ANKLE FRACTURE Left 08/17/2012   Procedure: OPEN REDUCTION INTERNAL FIXATION (ORIF) ANKLE FRACTURE/Left;  Surgeon: Wylene Simmer, MD;  Location: [facility];  Service: Orthopedics;  Laterality: Left;  . TIBIA IM NAIL INSERTION Left 08/17/2012   Procedure: INTRAMEDULLARY (IM) NAIL  TIBIAL/Left;  Surgeon: Wylene Simmer, MD;  Location: [facility];  Service: Orthopedics;  Laterality: Left;    Family History  Problem Relation Age of Onset  . Hypertension Father        lived to 46  . Other Brother        MVA  . Kidney disease Sister   . Other Brother        Stomach problems    Social History Reviewed with no changes to be made today.   Outpatient Medications Prior to Visit  Medication Sig Dispense Refill  . Blood Glucose Monitoring Suppl (TRUE METRIX METER) w/Device KIT Check blood sugars three times per day 1 kit 0  . cholecalciferol (VITAMIN D) 1000 units tablet Take 1,000 Units by mouth daily.    . fish oil-omega-3 fatty acids 1000 MG capsule Take 1 g by mouth daily.    . fluticasone (FLONASE) 50 MCG/ACT nasal spray Place 2 sprays into both nostrils daily. 16 g 6  . glucose blood (TRUE METRIX BLOOD GLUCOSE TEST) test strip Check blood sugar 3x a day. 100 each 12  . Insulin Detemir (LEVEMIR FLEXTOUCH) 100 UNIT/ML Pen Inject 15 Units into the skin daily at 10 pm. 15 mL 11  . Insulin Pen Needle 31G X 5 MM MISC Use as instructed. 100 each 12  . nortriptyline (PAMELOR) 25 MG capsule Take 2 capsules (50 mg total) by mouth at bedtime. 60 capsule 3  . TRUEPLUS LANCETS 28G MISC Use as instructed 100 each 3  . amLODipine (NORVASC) 10 MG tablet Take 1 tablet (10 mg total) by mouth daily. 90 tablet 1  . meclizine (ANTIVERT) 12.5 MG tablet Take 1 tablet (12.5 mg total) by mouth 3 (three) times daily as needed for dizziness. (Patient not taking: Reported on 09/08/2017) 60 tablet 0  . metFORMIN (GLUCOPHAGE) 500 MG tablet Take 1 tablet (500 mg total) by mouth 2 (two) times daily with a meal. 180 tablet 3  . nortriptyline (PAMELOR) 25 MG capsule Take 1 capsule (25 mg total) by mouth at bedtime. (Patient not taking: Reported on 09/08/2017) 30 capsule 3  . losartan (COZAAR) 100 MG tablet Take 1 tablet (100 mg total) by mouth daily. 90 tablet 0   No facility-administered medications prior  to visit.     No Known Allergies     Objective:    BP (!) 146/82 (BP Location: Left Arm, Patient Position: Sitting, Cuff Size: Normal)   Pulse 85   Temp 98 F (36.7 C) (Oral)   Ht _0  (1.778 m)   Wt 206 lb 12.8 oz (93.8 kg)   SpO2 94%   BMI 29.67 kg/m  Wt Readings from Last 3 Encounters:  09/08/17 206 lb 12.8 oz (93.8 kg)  08/06/17 194 lb 9.6 oz (88.3 kg)  07/23/17 196 lb (88.9 kg)    Physical Exam  Constitutional: He is oriented to person, place, and time. He appears well-developed and well-nourished.  HENT:  Head: Normocephalic and atraumatic.  Right Ear: External ear normal.  Left Ear: External  ear normal.  Nose: Mucosal edema, nasal deformity and septal deviation present.  Mouth/Throat: Oropharynx is clear and moist. No oropharyngeal exudate.  Eyes: Pupils are equal, round, and reactive to light. Conjunctivae and EOM are normal. No scleral icterus.  Neck: Normal range of motion. Neck supple. No tracheal deviation present. No thyromegaly present.  Cardiovascular: Normal rate, regular rhythm, normal heart sounds and intact distal pulses. Exam reveals no gallop and no friction rub.  No murmur heard. Pulses:      Dorsalis pedis pulses are 2+ on the right side, and 2+ on the left side.       Posterior tibial pulses are 2+ on the right side, and 2+ on the left side.  Pulmonary/Chest: Effort normal and breath sounds normal. No respiratory distress. He has no wheezes. He has no rales. He exhibits no mass and no tenderness. Right breast exhibits no inverted nipple, no mass, no nipple discharge, no skin change and no tenderness. Left breast exhibits no inverted nipple, no mass, no nipple discharge, no skin change and no tenderness.  Abdominal: Soft. Bowel sounds are normal. He exhibits distension. He exhibits no pulsatile midline mass and no mass. There is no tenderness. There is no rebound and no guarding. Hernia confirmed negative in the right inguinal area and confirmed  negative in the left inguinal area.  Genitourinary: Penis normal. Cremasteric reflex is present. Right testis shows no mass, no swelling and no tenderness. Left testis shows no mass, no swelling and no tenderness. Circumcised.  Musculoskeletal: Normal range of motion. He exhibits no tenderness.       Right lower leg: He exhibits edema.       Right foot: There is swelling. There is normal range of motion and no deformity.       Left foot: There is normal range of motion and no deformity.  Multiple varicosities throughout the LLE  Feet:  Right Foot:  Skin Integrity: Positive for callus and dry skin. Negative for skin breakdown.  Left Foot:  Skin Integrity: Positive for callus and dry skin. Negative for skin breakdown.  Lymphadenopathy:    He has no cervical adenopathy.       Right: No inguinal adenopathy present.       Left: No inguinal adenopathy present.  Neurological: He is alert and oriented to person, place, and time. He has normal strength. He displays normal reflexes. No cranial nerve deficit or sensory deficit. He exhibits normal muscle tone. He displays a negative Romberg sign. Coordination and gait normal.  Skin: Skin is warm, dry and intact. Capillary refill takes less than 2 seconds. No rash noted. No erythema.  Multiple toes of bilateral feet with onychomycotic changes.   Psychiatric: He has a normal mood and affect. His behavior is normal. Judgment and thought content normal.      Patient has been counseled extensively about nutrition and exercise as well as the importance of adherence with medications and regular follow-up. The patient was given clear instructions to go to ER or return to medical center if symptoms don't improve, worsen or new problems develop. The patient verbalized understanding.   Follow-up: Return in about 9 weeks (around 11/10/2017) for HTN/HPL/DM.   Gildardo Pounds, FNP-BC [facility], [place]     09/08/2017, 4:10 PM

## 2017-09-09 ENCOUNTER — Encounter: Payer: Self-pay | Admitting: Nurse Practitioner

## 2017-09-09 LAB — HEPATIC FUNCTION PANEL
ALK PHOS: 89 IU/L (ref 39–117)
ALT: 101 IU/L — AB (ref 0–44)
AST: 46 IU/L — AB (ref 0–40)
Albumin: 4.1 g/dL (ref 3.6–4.8)
BILIRUBIN TOTAL: 0.3 mg/dL (ref 0.0–1.2)
Bilirubin, Direct: 0.14 mg/dL (ref 0.00–0.40)
Total Protein: 7.5 g/dL (ref 6.0–8.5)

## 2017-09-09 LAB — LIPASE: LIPASE: 20 U/L (ref 13–78)

## 2017-09-11 ENCOUNTER — Telehealth: Payer: Self-pay

## 2017-09-11 NOTE — Telephone Encounter (Signed)
CMA spoke to patient to inform on lab results.  Patient verified DOB. Patient understood.  Patient stated the PCP was supposed to order a Abdominal ultrasound.

## 2017-09-11 NOTE — Telephone Encounter (Signed)
-----   Message from Gildardo Pounds, NP sent at 09/09/2017  8:13 PM EDT ----- Pancreatic enzymes are normal however your liver function enzymes are increasing. Currently awaiting results of abdominal ultrasound.

## 2017-09-17 ENCOUNTER — Other Ambulatory Visit: Payer: Self-pay | Admitting: Nurse Practitioner

## 2017-09-17 ENCOUNTER — Telehealth: Payer: Self-pay | Admitting: Nurse Practitioner

## 2017-09-17 DIAGNOSIS — E1165 Type 2 diabetes mellitus with hyperglycemia: Secondary | ICD-10-CM

## 2017-09-17 DIAGNOSIS — R7989 Other specified abnormal findings of blood chemistry: Secondary | ICD-10-CM

## 2017-09-17 DIAGNOSIS — R945 Abnormal results of liver function studies: Principal | ICD-10-CM

## 2017-09-17 NOTE — Telephone Encounter (Signed)
[facility] called to inform patient no showed their appointment 09/17/2017.

## 2017-09-17 NOTE — Telephone Encounter (Signed)
Ultrasound has been ordered. Please schedule. Thanks!!

## 2017-09-18 NOTE — Telephone Encounter (Signed)
CMA spoke to patient and inform him to call back [facility] to scheduled another appt. With them. CMA gave patient the number [phone].

## 2017-09-18 NOTE — Telephone Encounter (Signed)
Pt. Scheduled for a ultrasound on 09/22/2017.

## 2017-09-19 ENCOUNTER — Other Ambulatory Visit: Payer: Self-pay

## 2017-09-19 ENCOUNTER — Ambulatory Visit: Payer: Medicare Other | Attending: Nurse Practitioner | Admitting: Physical Therapy

## 2017-09-19 ENCOUNTER — Encounter: Payer: Self-pay | Admitting: Physical Therapy

## 2017-09-19 ENCOUNTER — Other Ambulatory Visit: Payer: Self-pay | Admitting: Nurse Practitioner

## 2017-09-19 DIAGNOSIS — R296 Repeated falls: Secondary | ICD-10-CM | POA: Insufficient documentation

## 2017-09-19 DIAGNOSIS — R42 Dizziness and giddiness: Secondary | ICD-10-CM | POA: Insufficient documentation

## 2017-09-19 DIAGNOSIS — E1165 Type 2 diabetes mellitus with hyperglycemia: Secondary | ICD-10-CM

## 2017-09-19 DIAGNOSIS — R2681 Unsteadiness on feet: Secondary | ICD-10-CM

## 2017-09-19 NOTE — Patient Instructions (Signed)
  Gaze Stabilization: Tip Card  1.Target must remain in focus, not blurry, and appear stationary while head is in motion. 2.Perform exercises with small head movements (45 to either side of midline). 3.Increase speed of head motion so long as target is in focus. 4.If you wear eyeglasses, be sure you can see target through lens (therapist will give specific instructions for bifocal / progressive lenses). 5.These exercises may provoke dizziness or nausea. Work through these symptoms. If too dizzy, slow head movement slightly. Rest between each exercise. 6.Exercises demand concentration; avoid distractions.      Special Instructions: Exercises may bring on mild to moderate symptoms of dizziness that resolve within 30 minutes of completing exercises. If symptoms are lasting longer than 30 minutes, modify your exercises by:  >decreasing the # of times you complete each activity >ensuring your symptoms return to baseline before moving onto the next exercise >dividing up exercises so you do not do them all in one session, but multiple short sessions throughout the day >doing them once a day until symptoms improve   Gaze Stabilization: Sitting    Keeping eyes on target on wall 3 feet away, and move head side to side 5 times (to each side). Repeat while moving head up and down 5 times (each direction).  Do __2-3__ sessions per day.  Copyright  VHI. All rights reserved.

## 2017-09-20 ENCOUNTER — Telehealth: Payer: Self-pay | Admitting: Physical Therapy
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
## 2017-09-21 ENCOUNTER — Other Ambulatory Visit: Payer: Self-pay | Admitting: Nurse Practitioner

## 2017-09-21 DIAGNOSIS — F0781 Postconcussional syndrome: Secondary | ICD-10-CM

## 2017-09-21 NOTE — Telephone Encounter (Signed)
Thank you. I have placed a referral.

## 2017-09-22 ENCOUNTER — Ambulatory Visit: Payer: Medicare Other | Admitting: Rehabilitative and Restorative Service Providers"

## 2017-09-22 ENCOUNTER — Ambulatory Visit (HOSPITAL_COMMUNITY)
Admission: RE | Admit: 2017-09-22 | Discharge: 2017-09-22 | Disposition: A | Discharge status: Home / Self Care | Payer: Medicare Other | Source: Ambulatory Visit | Attending: Nurse Practitioner | Admitting: Nurse Practitioner

## 2017-09-22 DIAGNOSIS — R945 Abnormal results of liver function studies: Secondary | ICD-10-CM | POA: Diagnosis not present

## 2017-09-22 DIAGNOSIS — R7989 Other specified abnormal findings of blood chemistry: Secondary | ICD-10-CM | POA: Diagnosis not present

## 2017-09-23 NOTE — Telephone Encounter (Signed)
-----   Message from Gildardo Pounds, NP sent at 09/22/2017  1:47 PM EDT ----- Ultrasound does not show any cirrhosis of the liver. At this time we will just continue to periodically monitor your liver function  through blood work.

## 2017-09-23 NOTE — Telephone Encounter (Signed)
CMA spoke to patient to inform on ultrasound results.  Patient verified DOB. Patient understood.

## 2017-09-29 ENCOUNTER — Encounter: Payer: Self-pay | Admitting: Physical Therapy

## 2017-09-29 ENCOUNTER — Ambulatory Visit: Payer: Medicare Other | Admitting: Physical Therapy

## 2017-09-29 DIAGNOSIS — R2681 Unsteadiness on feet: Secondary | ICD-10-CM | POA: Diagnosis not present

## 2017-09-29 DIAGNOSIS — R42 Dizziness and giddiness: Secondary | ICD-10-CM

## 2017-09-29 DIAGNOSIS — R296 Repeated falls: Secondary | ICD-10-CM | POA: Diagnosis not present
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
## 2017-09-29 NOTE — Patient Instructions (Signed)
Gaze Stabilization: Tip Card  1.Target must remain in focus, not blurry, and appear stationary while head is in motion. 2.Perform exercises with small head movements (45 to either side of midline). 3.Increase speed of head motion so long as target is in focus. 4.If you wear eyeglasses, be sure you can see target through lens (therapist will give specific instructions for bifocal / progressive lenses). 5.These exercises may provoke dizziness or nausea. Work through these symptoms. If too dizzy, slow head movement slightly. Rest between each exercise. 6.Exercises demand concentration; avoid distractions.   Special Instructions: Exercises may bring on mild to moderate symptoms of dizziness that resolve within 30 minutes of completing exercises. If symptoms are lasting longer than 30 minutes, modify your exercises by:  >decreasing the # of times you complete each activity >ensuring your symptoms return to baseline before moving onto the next exercise >dividing up exercises so you do not do them all in one session, but multiple short sessions throughout the day >doing them once a day until symptoms improve    Copyright  VHI. All rights reserved.       For safety, perform standing exercises close to a counter, wall, corner, or next to someone.   Gaze Stabilization - Standing Feet Apart   Feet shoulder width apart, Keeping eyes on target on wall 3 feet away, and move head side to side 5-10 times (to each side). Repeat while moving head up and down 5-10 times (each direction).  Do __2-3__ sessions per day.    Feet Together (Compliant Surface) Head Motion - Eyes Open    With eyes open, standing on pillow__, feet together, move head slowly: up and down x 5, side to side x5. Then stand with eyes closed for 30 seconds. Repeat __1__ times per session. Do ___2_ sessions per day.  Copyright  VHI. All rights reserved.

## 2017-09-30 ENCOUNTER — Encounter: Payer: Self-pay | Admitting: Physical Therapy

## 2017-09-30 ENCOUNTER — Ambulatory Visit: Payer: Medicare Other | Admitting: Physical Therapy

## 2017-09-30 DIAGNOSIS — R42 Dizziness and giddiness: Secondary | ICD-10-CM | POA: Diagnosis not present

## 2017-09-30 DIAGNOSIS — R2681 Unsteadiness on feet: Secondary | ICD-10-CM

## 2017-09-30 DIAGNOSIS — R296 Repeated falls: Secondary | ICD-10-CM | POA: Diagnosis not present
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
## 2017-10-02 DIAGNOSIS — H524 Presbyopia: Secondary | ICD-10-CM | POA: Diagnosis not present

## 2017-10-02 DIAGNOSIS — E119 Type 2 diabetes mellitus without complications: Secondary | ICD-10-CM | POA: Diagnosis not present

## 2017-10-02 DIAGNOSIS — H2513 Age-related nuclear cataract, bilateral: Secondary | ICD-10-CM | POA: Diagnosis not present

## 2017-10-02 DIAGNOSIS — Z794 Long term (current) use of insulin: Secondary | ICD-10-CM | POA: Diagnosis not present

## 2017-10-02 DIAGNOSIS — H5203 Hypermetropia, bilateral: Secondary | ICD-10-CM | POA: Diagnosis not present

## 2017-10-08 DIAGNOSIS — R195 Other fecal abnormalities: Secondary | ICD-10-CM | POA: Diagnosis not present

## 2017-10-10 ENCOUNTER — Ambulatory Visit: Payer: Medicare Other | Admitting: Physical Therapy

## 2017-10-10 ENCOUNTER — Encounter: Payer: Self-pay | Admitting: Physical Therapy

## 2017-10-10 DIAGNOSIS — R2681 Unsteadiness on feet: Secondary | ICD-10-CM | POA: Diagnosis not present

## 2017-10-10 DIAGNOSIS — R296 Repeated falls: Secondary | ICD-10-CM | POA: Diagnosis not present

## 2017-10-10 DIAGNOSIS — R42 Dizziness and giddiness: Secondary | ICD-10-CM | POA: Diagnosis not present

## 2017-10-10 NOTE — Patient Instructions (Signed)
Neck Rotation    Sitting down, at the front edge of the chair so your back is not touching anything. Begin with chin level, head centered over spine. Quickly turn head to look over shoulder,and then turn to othre shoulder. Repeat 5 turns to each side. Rest and repeat 5 more head turns.  Repeat 2 times per day.   *If symptoms are less than 6 out of 10, then begin to increase number of head turns by one per day until able to do 10 turns in a row with at most 5 out of 10 dizziness.   Copyright  VHI. All rights reserved.
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
## 2017-10-12 ENCOUNTER — Other Ambulatory Visit: Payer: Self-pay | Admitting: Nurse Practitioner

## 2017-10-12 DIAGNOSIS — F0781 Postconcussional syndrome: Secondary | ICD-10-CM

## 2017-10-14 ENCOUNTER — Ambulatory Visit: Payer: Medicare Other | Admitting: Physical Therapy

## 2017-10-16 ENCOUNTER — Encounter: Payer: Self-pay | Admitting: Physical Therapy

## 2017-10-16 ENCOUNTER — Ambulatory Visit: Payer: Medicare Other | Attending: Nurse Practitioner | Admitting: Physical Therapy

## 2017-10-16 DIAGNOSIS — R42 Dizziness and giddiness: Secondary | ICD-10-CM | POA: Diagnosis not present

## 2017-10-16 DIAGNOSIS — R2681 Unsteadiness on feet: Secondary | ICD-10-CM

## 2017-10-16 NOTE — Patient Instructions (Addendum)
  Call Geryl Rankins, NP office and ask about status of referral to Neuropsychologist  For your "A" exercise, do this sitting down, about 3 feet from the target. The target should be clear without shadow when you start exercise. (If it's not clear, not a good time to do the exercise and try later in the day). Turn your head a litlle less and STOP as soon as the target begins to have a shadow. When you stop keep your eyes on the target for up to 30 seconds trying to get the letter to look clear (without shadow) again. After 30 seconds if still has a shadow, close your eyes for a minute or more to rest your eyes and your brain.
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
## 2017-10-20 ENCOUNTER — Encounter: Payer: Self-pay | Admitting: Physical Therapy

## 2017-10-20 ENCOUNTER — Ambulatory Visit: Payer: Medicare Other | Admitting: Physical Therapy

## 2017-10-20 DIAGNOSIS — R42 Dizziness and giddiness: Secondary | ICD-10-CM | POA: Diagnosis not present

## 2017-10-20 DIAGNOSIS — R2681 Unsteadiness on feet: Secondary | ICD-10-CM | POA: Diagnosis not present

## 2017-10-20 NOTE — Patient Instructions (Signed)
Access Code: [email or web address]  URL: [email or web address]  Date: 10/20/2017  Prepared by: Barry Brunner   Exercises  Seated Horizontal Smooth Pursuit - 2 sets - up to 30 sec hold - 2x daily - 7x weekly  Sitting to Supine Roll - 5 reps - 1 sets - 2-3x daily - 7x weekly
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
## 2017-10-23 ENCOUNTER — Encounter: Payer: Self-pay | Admitting: Physical Therapy

## 2017-10-23 ENCOUNTER — Ambulatory Visit: Payer: Medicare Other | Admitting: Physical Therapy

## 2017-10-23 DIAGNOSIS — R42 Dizziness and giddiness: Secondary | ICD-10-CM | POA: Diagnosis not present

## 2017-10-23 DIAGNOSIS — R2681 Unsteadiness on feet: Secondary | ICD-10-CM
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
## 2017-10-23 NOTE — Patient Instructions (Addendum)
Access Code: [email or web address]  URL: [email or web address]  Date: 10/23/2017  Prepared by: Barry Brunner   Exercises  Seated Horizontal Smooth Pursuit - 2 sets - up to 30 sec hold - 2x daily - 7x weekly  Sitting to Supine Roll - 5 reps - 1 sets - 2-3x daily - 7x weekly   ADDED TODAY Walking with Head Rotation - 5 reps - 1 sets - 2x daily - 7x weekly

## 2017-10-27 ENCOUNTER — Encounter: Payer: Medicare Other | Admitting: Rehabilitative and Restorative Service Providers"

## 2017-10-30 ENCOUNTER — Encounter: Payer: Medicare Other | Admitting: Rehabilitative and Restorative Service Providers"

## 2017-11-03 ENCOUNTER — Encounter: Payer: Medicare Other | Admitting: Physical Therapy

## 2017-11-05 ENCOUNTER — Ambulatory Visit: Payer: Medicare Other | Admitting: Physical Therapy
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
## 2017-11-10 ENCOUNTER — Encounter: Payer: Self-pay | Admitting: Physical Therapy

## 2017-11-10 ENCOUNTER — Ambulatory Visit: Payer: Medicare Other | Admitting: Physical Therapy

## 2017-11-10 DIAGNOSIS — R42 Dizziness and giddiness: Secondary | ICD-10-CM | POA: Diagnosis not present

## 2017-11-10 DIAGNOSIS — R2681 Unsteadiness on feet: Secondary | ICD-10-CM | POA: Diagnosis not present
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
## 2017-11-11 ENCOUNTER — Encounter: Payer: Self-pay | Admitting: Nurse Practitioner

## 2017-11-11 ENCOUNTER — Ambulatory Visit: Payer: Medicare Other | Attending: Nurse Practitioner | Admitting: Nurse Practitioner

## 2017-11-11 VITALS — BP 126/88 | HR 95 | Temp 98.2°F | Ht 70.0 in | Wt 212.0 lb

## 2017-11-11 DIAGNOSIS — Z79899 Other long term (current) drug therapy: Secondary | ICD-10-CM | POA: Diagnosis not present

## 2017-11-11 DIAGNOSIS — Z794 Long term (current) use of insulin: Secondary | ICD-10-CM | POA: Diagnosis not present

## 2017-11-11 DIAGNOSIS — E782 Mixed hyperlipidemia: Secondary | ICD-10-CM

## 2017-11-11 DIAGNOSIS — G4700 Insomnia, unspecified: Secondary | ICD-10-CM | POA: Diagnosis not present

## 2017-11-11 DIAGNOSIS — I10 Essential (primary) hypertension: Secondary | ICD-10-CM

## 2017-11-11 DIAGNOSIS — E1165 Type 2 diabetes mellitus with hyperglycemia: Secondary | ICD-10-CM

## 2017-11-11 DIAGNOSIS — F419 Anxiety disorder, unspecified: Secondary | ICD-10-CM | POA: Diagnosis not present

## 2017-11-11 DIAGNOSIS — Z87442 Personal history of urinary calculi: Secondary | ICD-10-CM | POA: Diagnosis not present

## 2017-11-11 DIAGNOSIS — Z7951 Long term (current) use of inhaled steroids: Secondary | ICD-10-CM | POA: Diagnosis not present

## 2017-11-11 LAB — POCT GLYCOSYLATED HEMOGLOBIN (HGB A1C): HEMOGLOBIN A1C: 8 % — AB (ref 4.0–5.6)

## 2017-11-11 LAB — GLUCOSE, POCT (MANUAL RESULT ENTRY): POC Glucose: 200 mg/dl — AB (ref 70–99)

## 2017-11-11 MED ORDER — TRAZODONE HCL 50 MG PO TABS: 50.0000 mg | ORAL_TABLET | Freq: Every day | ORAL | 2 refills | Status: DC

## 2017-11-11 NOTE — Progress Notes (Signed)
Phillip Paul was seen today for follow-up and medication refill.  Diagnoses and all orders for this visit:  Uncontrolled type 2 diabetes mellitus with hyperglycemia (HCC) -     Glucose (CBG) -     HgB A1c Continue blood sugar control as discussed in office today, low carbohydrate diet, and regular physical exercise as tolerated, 150 minutes per week (30 min each day, 5 days per week, or 50 min 3 days per week). Keep blood sugar logs with fasting goal of 90-130 mg/dl, post prandial (after you eat) less than 180.  For Hypoglycemia: BS <60 and Hyperglycemia BS >400; contact the clinic ASAP. Annual eye exams and foot exams are recommended.   Insomnia, unspecified type. -     traZODone (DESYREL) 50 MG tablet; Take 1 tablet (50 mg total) by mouth at bedtime. Well controlled/stable  Essential hypertension Continue all antihypertensives as prescribed.  Remember to bring in your blood pressure log with you for your follow up appointment.  DASH/Mediterranean Diets are healthier choices for HTN.    Mixed hyperlipidemia INSTRUCTIONS: Work on a low fat, heart healthy diet and participate in regular aerobic exercise program by working out at least 150 minutes per week; 5 days a week-30 minutes per day. Avoid red meat, fried foods. junk foods, sodas, sugary drinks, unhealthy snacking, alcohol and smoking.  Drink at least 48oz of water per day and monitor your carbohydrate intake daily.       Patient has been counseled on age-appropriate routine health concerns for screening and prevention. These are reviewed and up-to-date. Referrals have been placed accordingly. Immunizations are up-to-date or declined.    Subjective:   Chief Complaint  Patient presents with  . Follow-up    Pt. is here to follow-up on diabetes.   . Medication Refill   HPI Phillip Paul Luepke 67 y.o. male presents to office today for follow up to DM HTN. He is working with vestibular rehab for dizziness which he  states has improved.   Diabetes Mellitus Type 2 A1c has slightly worsened. He has stopped walking/exercising due to his dizziness. His weight is up.  He has not been diet compliant over the past few weeks but states today he is going "to get back on track". He denies any hypo or hyperglycemic symptoms. Doing well. He is not monitoring his blood glucose levels as instructed. Taking levemir 15 units nightly, metformin 500 mg BID. We discussed dietary and exercise modifications to help lower his a1c. He is aware if A1c remains elevated at next office visit we will need to make additional adjustments with is medications.  Lab Results  Component Value Date   HGBA1C 8.0 (A) 11/11/2017    CHRONIC HYPERTENSION Disease Monitoring  Blood pressure range BP Readings from Last 3 Encounters:  11/12/17 132/84  11/11/17 126/88  09/08/17 (!) 146/82  Chronic and well controlled on Losartan 100 mg and amlodipine 10 mg daily.   Chest pain: no   Dyspnea: no   Claudication: no  Medication compliance: yes  Medication Side Effects  Lightheadedness: yes   Urinary frequency: no   Edema: no   Impotence: no  Preventitive Healthcare:  Exercise: no   Diet Pattern: diet: general  Salt Restriction:  no   Hyperlipidemia Patient presents for follow up to hyperlipidemia.  He is medication compliant taking pravastatin 52m daily. He is not diet compliant and denies chest pain, dyspnea, exertional chest pressure/discomfort, fatigue, poor exercise tolerance and skin xanthelasma or statin intolerance including myalgias.  Lab Results  Component Value Date   CHOL 147 08/06/2017   Lab Results  Component Value Date   HDL 48 08/06/2017   Lab Results  Component Value Date   LDLCALC 76 08/06/2017   Lab Results  Component Value Date   TRIG 116 08/06/2017   Lab Results  Component Value Date   CHOLHDL 3.1 08/06/2017   Review of Systems  Constitutional: Negative for fever, malaise/fatigue and weight loss.    HENT: Negative.  Negative for nosebleeds.   Eyes: Negative.  Negative for blurred vision, double vision and photophobia.  Respiratory: Negative.  Negative for cough and shortness of breath.   Cardiovascular: Negative.  Negative for chest pain, palpitations and leg swelling.  Gastrointestinal: Negative.  Negative for heartburn, nausea and vomiting.  Musculoskeletal: Negative.  Negative for myalgias.  Neurological: Positive for dizziness. Negative for focal weakness, seizures and headaches.  Psychiatric/Behavioral: Negative for suicidal ideas. The patient is nervous/anxious and has insomnia.     Past Medical History:  Diagnosis Date  . Abnormal EKG   . Anxiety   . Diabetes mellitus without complication ([place])   . Dizzy   . Hypertension   . Kidney stones   . Racing heart beat     Past Surgical History:  Procedure Laterality Date  . CYSTOSCOPY     LEFT URETEROSCOPIC STONE MANIPULATION AND REMOVAL; PLACEMENT OF  LEFT DOUBLE-J URETERAL STENT  . CYSTOSCOPY W/ URETERAL STENT PLACEMENT     LEFT DOUBLE-J URETERAL STENT  . ORIF ANKLE FRACTURE Left 08/17/2012   Procedure: OPEN REDUCTION INTERNAL FIXATION (ORIF) ANKLE FRACTURE/Left;  Surgeon: Wylene Simmer, MD;  Location: [facility];  Service: Orthopedics;  Laterality: Left;  . TIBIA IM NAIL INSERTION Left 08/17/2012   Procedure: INTRAMEDULLARY (IM) NAIL TIBIAL/Left;  Surgeon: Wylene Simmer, MD;  Location: [facility];  Service: Orthopedics;  Laterality: Left;    Family History  Problem Relation Age of Onset  . Hypertension Father        lived to 33  . Other Brother        MVA  . Kidney disease Sister   . Other Brother        Stomach problems    Social History Reviewed with no changes to be made today.   Outpatient Medications Prior to Visit  Medication Sig Dispense Refill  . Blood Glucose Monitoring Suppl (TRUE METRIX METER) w/Device KIT Check blood sugars three times per day 1 kit 0  . cholecalciferol (VITAMIN D) 1000 units tablet Take 1,000  Units by mouth daily.    . fish oil-omega-3 fatty acids 1000 MG capsule Take 1 g by mouth daily.    . fluticasone (FLONASE) 50 MCG/ACT nasal spray Place 2 sprays into both nostrils daily. 16 g 6  . glucose blood (TRUE METRIX BLOOD GLUCOSE TEST) test strip Check blood sugar 3x a day. 100 each 12  . Insulin Detemir (LEVEMIR FLEXTOUCH) 100 UNIT/ML Pen Inject 15 Units into the skin daily at 10 pm. 15 mL 11  . Insulin Pen Needle 31G X 5 MM MISC Use as instructed. 100 each 12  . pravastatin (PRAVACHOL) 20 MG tablet Take 1 tablet (20 mg total) by mouth daily. 90 tablet 3  . TRUEPLUS LANCETS 28G MISC Use as instructed 100 each 3  . nortriptyline (PAMELOR) 25 MG capsule Take 2 capsules (50 mg total) by mouth at bedtime. 60 capsule 3  . traZODone (DESYREL) 50 MG tablet Take 1 tablet (50 mg total) by mouth at bedtime. 30 tablet 2  .  amLODipine (NORVASC) 10 MG tablet Take 1 tablet (10 mg total) by mouth daily. 90 tablet 1  . losartan (COZAAR) 100 MG tablet Take 1 tablet (100 mg total) by mouth daily. 90 tablet 0  . meclizine (ANTIVERT) 12.5 MG tablet Take 1 tablet (12.5 mg total) by mouth 3 (three) times daily as needed for dizziness. (Patient not taking: Reported on 11/11/2017) 60 tablet 0  . metFORMIN (GLUCOPHAGE) 500 MG tablet Take 1 tablet (500 mg total) by mouth 2 (two) times daily with a meal. 180 tablet 3  . nortriptyline (PAMELOR) 25 MG capsule Take 1 capsule (25 mg total) by mouth at bedtime. (Patient not taking: Reported on 09/19/2017) 30 capsule 3   No facility-administered medications prior to visit.     No Known Allergies     Objective:    BP 126/88 (BP Location: Left Arm, Patient Position: Sitting, Cuff Size: Normal)   Pulse 95   Temp 98.2 F (36.8 C) (Oral)   Ht 5' 10"  (1.778 m)   Wt 212 lb (96.2 kg)   SpO2 94%   BMI 30.42 kg/m  Wt Readings from Last 3 Encounters:  11/12/17 207 lb (93.9 kg)  11/11/17 212 lb (96.2 kg)  09/08/17 206 lb 12.8 oz (93.8 kg)    Physical Exam    Constitutional: He is oriented to person, place, and time. He appears well-developed and well-nourished. He is cooperative.  HENT:  Head: Normocephalic and atraumatic.  Eyes: EOM are normal.  Neck: Normal range of motion.  Cardiovascular: Normal rate, regular rhythm, normal heart sounds and intact distal pulses. Exam reveals no gallop and no friction rub.  No murmur heard. Pulmonary/Chest: Effort normal and breath sounds normal. No tachypnea. No respiratory distress. He has no decreased breath sounds. He has no wheezes. He has no rhonchi. He has no rales. He exhibits no tenderness.  Abdominal: Soft. Bowel sounds are normal.  Musculoskeletal: Normal range of motion. He exhibits no edema.  Neurological: He is alert and oriented to person, place, and time. Coordination normal.  Skin: Skin is warm and dry.  Psychiatric: He has a normal mood and affect. His behavior is normal. Judgment and thought content normal.  Nursing note and vitals reviewed.     Patient has been counseled extensively about nutrition and exercise as well as the importance of adherence with medications and regular follow-up. The patient was given clear instructions to go to ER or return to medical center if symptoms don't improve, worsen or new problems develop. The patient verbalized understanding.   Follow-up: Return in about 3 months (around 02/11/2018) for HTN, DM, HPL.   Gildardo Pounds, FNP-BC [facility], [place]   11/12/2017, 11:57 PM

## 2017-11-12 ENCOUNTER — Encounter: Payer: Medicare Other | Admitting: Rehabilitative and Restorative Service Providers"

## 2017-11-12 ENCOUNTER — Encounter: Payer: Self-pay | Admitting: Neurology

## 2017-11-12 ENCOUNTER — Ambulatory Visit (INDEPENDENT_AMBULATORY_CARE_PROVIDER_SITE_OTHER): Payer: Medicare Other | Admitting: Neurology

## 2017-11-12 VITALS — BP 132/84 | HR 92 | Ht 69.0 in | Wt 207.0 lb

## 2017-11-12 DIAGNOSIS — G44229 Chronic tension-type headache, not intractable: Secondary | ICD-10-CM | POA: Diagnosis not present

## 2017-11-12 DIAGNOSIS — F0781 Postconcussional syndrome: Secondary | ICD-10-CM | POA: Diagnosis not present

## 2017-11-12 MED ORDER — NORTRIPTYLINE HCL 25 MG PO CAPS: 75.0000 mg | ORAL_CAPSULE | Freq: Every day | ORAL | 3 refills | Status: DC

## 2017-11-12 MED ORDER — NORTRIPTYLINE HCL 25 MG PO CAPS: 75.0000 mg | ORAL_CAPSULE | Freq: Every evening | ORAL | 3 refills | Status: DC

## 2017-11-12 NOTE — Addendum Note (Signed)
Addended by: Clois Comber on: 11/12/2017 12:15 PM   Modules accepted: Orders

## 2017-11-12 NOTE — Patient Instructions (Signed)
1.  We will increase nortriptyline to 75mg  (three 25mg  pills) at night.  If it makes her sleepy in the morning, try taking it earlier in the evening (such as 6 to 7 PM) 2.  Limit use of pain relievers to no more than 2 days out of week to prevent risk of rebound or medication-overuse headache. 3.  Follow up in 3 to 4 months.

## 2017-11-13 ENCOUNTER — Encounter: Payer: Self-pay | Admitting: Nurse Practitioner

## 2017-11-19 DIAGNOSIS — D126 Benign neoplasm of colon, unspecified: Secondary | ICD-10-CM | POA: Diagnosis not present

## 2017-11-19 DIAGNOSIS — R195 Other fecal abnormalities: Secondary | ICD-10-CM | POA: Diagnosis not present

## 2017-11-21 DIAGNOSIS — D126 Benign neoplasm of colon, unspecified: Secondary | ICD-10-CM | POA: Diagnosis not present

## 2017-11-28 ENCOUNTER — Telehealth: Payer: Self-pay | Admitting: Physical Medicine & Rehabilitation

## 2017-12-02 ENCOUNTER — Ambulatory Visit: Payer: Medicare Other | Admitting: Physical Therapy

## 2017-12-08 ENCOUNTER — Ambulatory Visit: Payer: Medicare Other | Attending: Nurse Practitioner | Admitting: Physical Therapy

## 2017-12-08 ENCOUNTER — Encounter: Payer: Self-pay | Admitting: Physical Therapy

## 2017-12-08 DIAGNOSIS — R2681 Unsteadiness on feet: Secondary | ICD-10-CM | POA: Insufficient documentation

## 2017-12-08 DIAGNOSIS — R42 Dizziness and giddiness: Secondary | ICD-10-CM | POA: Insufficient documentation

## 2017-12-08 NOTE — Patient Instructions (Signed)
For your "A" exercise, do this sitting down, 3-6 feet from the target. The target should be clear without shadow when you start exercise. (If it's not clear, not a good time to do the exercise and try later in the day). Turn your head as litlle as you have to so that the target stays in focus. If the letter begins to have a shadow or double lines, then STOP and rest your eyes. When you stop, keep your eyes on the target for up to 30 seconds trying to get the letter to look clear (without shadow) again. After 30 seconds if still has a shadow, close your eyes for a minute or more to rest your eyes and your brain.   Do this exercise for up to 30 seconds (as long as the A looks clear--stop as soon as it looks wrong). Repeat 3 times per day.
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
## 2017-12-15 ENCOUNTER — Ambulatory Visit: Payer: Medicare Other | Attending: Nurse Practitioner | Admitting: Physical Therapy

## 2017-12-15 ENCOUNTER — Encounter: Payer: Self-pay | Admitting: Physical Therapy

## 2017-12-15 DIAGNOSIS — R42 Dizziness and giddiness: Secondary | ICD-10-CM

## 2017-12-15 DIAGNOSIS — R2681 Unsteadiness on feet: Secondary | ICD-10-CM | POA: Diagnosis not present
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
## 2017-12-15 NOTE — Patient Instructions (Signed)
Access Code: [email or web address]  URL: [email or web address]  Date: 12/15/2017  Prepared by: Barry Brunner   Updated Exercises  Seated Horizontal Smooth Pursuit - 2-3x daily - 7x weekly  Sitting to Supine Roll - 5 reps - 1 sets - 2-3x daily - 7x weekly  Walking with Head Rotation - 5 reps - 1 sets - 2x daily - 7x weekly  Seated Left Head Turns Vestibular Habituation - 5-10 reps - 1 sets - 2-3x daily - 7x weekly

## 2017-12-22 ENCOUNTER — Ambulatory Visit: Payer: Medicare Other | Admitting: Physical Therapy

## 2017-12-22 ENCOUNTER — Encounter: Payer: Self-pay | Admitting: Physical Therapy

## 2017-12-22 DIAGNOSIS — R42 Dizziness and giddiness: Secondary | ICD-10-CM | POA: Diagnosis not present

## 2017-12-22 DIAGNOSIS — R2681 Unsteadiness on feet: Secondary | ICD-10-CM | POA: Diagnosis not present
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
## 2017-12-29 ENCOUNTER — Other Ambulatory Visit: Payer: Self-pay | Admitting: Nurse Practitioner

## 2017-12-29 ENCOUNTER — Ambulatory Visit: Payer: Medicare Other | Admitting: Physical Therapy

## 2017-12-29 ENCOUNTER — Encounter: Payer: Self-pay | Admitting: Physical Therapy

## 2017-12-29 DIAGNOSIS — R42 Dizziness and giddiness: Secondary | ICD-10-CM | POA: Diagnosis not present

## 2017-12-29 DIAGNOSIS — R2681 Unsteadiness on feet: Secondary | ICD-10-CM

## 2017-12-29 DIAGNOSIS — I10 Essential (primary) hypertension: Secondary | ICD-10-CM

## 2017-12-29 NOTE — Patient Instructions (Addendum)
Access Code: [email or web address]  URL: [email or web address]  Date: 12/29/2017  Prepared by: Barry Brunner   Exercises  Seated Horizontal Smooth Pursuit - 2-3x daily - 7x weekly  Sitting to Supine Roll - 5 reps - 1 sets - 2-3x daily - 7x weekly  Walking with Head Rotation - 5 reps - 1 sets - 2x daily - 7x weekly  Seated Left Head Turns Vestibular Habituation - 5-10 reps - 1 sets - 2-3x daily - 7x weekly ADDED TODAY  Pencil Pushups - 5 reps - 1 sets - 2x daily - 7x weekly
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
## 2018-01-05 ENCOUNTER — Encounter: Payer: Self-pay | Admitting: Physical Therapy

## 2018-01-05 ENCOUNTER — Ambulatory Visit: Payer: Medicare Other | Admitting: Physical Therapy

## 2018-01-05 DIAGNOSIS — R42 Dizziness and giddiness: Secondary | ICD-10-CM

## 2018-01-05 DIAGNOSIS — R2681 Unsteadiness on feet: Secondary | ICD-10-CM

## 2018-01-05 NOTE — Patient Instructions (Signed)
Access Code: [email or web address]  URL: [email or web address]  Date: 01/05/2018  Prepared by: Barry Brunner   Exercises  Seated Horizontal Smooth Pursuit - 2-3x daily - 7x weekly  Sitting to Supine Roll - 5 reps - 1 sets - 2-3x daily - 7x weekly  Walking with Head Rotation - 5 reps - 1 sets - 2x daily - 7x weekly  left head turns - 10 reps - 1 sets - 2-3x daily - 7x weekly  Pencil Pushups - 5 reps - 1 sets - 2x daily - 7x weekly  Foam-feet together, EC, head turns - 10 reps - 1 sets - - hold - 1x daily - 7x weekly
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
## 2018-02-06 ENCOUNTER — Telehealth: Payer: Self-pay | Admitting: Nurse Practitioner

## 2018-02-06 NOTE — Telephone Encounter (Signed)
1) Medication(s) Requested (by name): TRAZADONE script to say two per day.    2) Pharmacy of Choice: [facility].  3) Special Requests: pt has appt 02/13/18.  Pt ph [phone].   Approved medications will be sent to the pharmacy, we will reach out if there is an issue.  Requests made after 3pm may not be addressed until the following business day!  If a patient is unsure of the name of the medication(s) please note and ask patient to call back when they are able to provide all info, do not send to responsible party until all information is available!

## 2018-02-06 NOTE — Telephone Encounter (Signed)
I don't see anywhere in this patients chart where he was instructed to take two tablets.

## 2018-02-09 ENCOUNTER — Other Ambulatory Visit: Payer: Self-pay | Admitting: Nurse Practitioner

## 2018-02-09 DIAGNOSIS — G4700 Insomnia, unspecified: Secondary | ICD-10-CM

## 2018-02-09 MED ORDER — TRAZODONE HCL 100 MG PO TABS: 100.0000 mg | ORAL_TABLET | Freq: Every day | ORAL | 0 refills | Status: DC

## 2018-02-11 ENCOUNTER — Ambulatory Visit: Payer: Medicare Other | Admitting: Nurse Practitioner

## 2018-02-13 ENCOUNTER — Ambulatory Visit: Payer: Medicare Other | Attending: Nurse Practitioner | Admitting: Nurse Practitioner

## 2018-02-13 ENCOUNTER — Encounter: Payer: Self-pay | Admitting: Nurse Practitioner

## 2018-02-13 VITALS — BP 114/74 | HR 96 | Temp 97.5°F | Ht 69.0 in | Wt 210.2 lb

## 2018-02-13 DIAGNOSIS — I10 Essential (primary) hypertension: Secondary | ICD-10-CM | POA: Diagnosis not present

## 2018-02-13 DIAGNOSIS — Z79899 Other long term (current) drug therapy: Secondary | ICD-10-CM | POA: Insufficient documentation

## 2018-02-13 DIAGNOSIS — E782 Mixed hyperlipidemia: Secondary | ICD-10-CM | POA: Diagnosis not present

## 2018-02-13 DIAGNOSIS — G4700 Insomnia, unspecified: Secondary | ICD-10-CM | POA: Diagnosis not present

## 2018-02-13 DIAGNOSIS — Z794 Long term (current) use of insulin: Secondary | ICD-10-CM | POA: Insufficient documentation

## 2018-02-13 DIAGNOSIS — Z7984 Long term (current) use of oral hypoglycemic drugs: Secondary | ICD-10-CM | POA: Insufficient documentation

## 2018-02-13 DIAGNOSIS — IMO0002 Reserved for concepts with insufficient information to code with codable children: Secondary | ICD-10-CM

## 2018-02-13 DIAGNOSIS — E1165 Type 2 diabetes mellitus with hyperglycemia: Secondary | ICD-10-CM

## 2018-02-13 DIAGNOSIS — Z7901 Long term (current) use of anticoagulants: Secondary | ICD-10-CM | POA: Diagnosis not present

## 2018-02-13 DIAGNOSIS — Z8249 Family history of ischemic heart disease and other diseases of the circulatory system: Secondary | ICD-10-CM | POA: Diagnosis not present

## 2018-02-13 DIAGNOSIS — E118 Type 2 diabetes mellitus with unspecified complications: Secondary | ICD-10-CM | POA: Insufficient documentation

## 2018-02-13 LAB — POCT GLYCOSYLATED HEMOGLOBIN (HGB A1C): HbA1c, POC (controlled diabetic range): 7.2 % — AB (ref 0.0–7.0)

## 2018-02-13 LAB — GLUCOSE, POCT (MANUAL RESULT ENTRY): POC Glucose: 119 mg/dl — AB (ref 70–99)

## 2018-02-13 MED ORDER — TRAZODONE HCL 100 MG PO TABS: 100.0000 mg | ORAL_TABLET | Freq: Every day | ORAL | 0 refills | Status: DC

## 2018-02-13 NOTE — Progress Notes (Signed)
Assessment & Plan:  Phillip Paul was seen today for diabetes.  Diagnoses and all orders for this visit:  Diabetes mellitus type 2, uncontrolled, with complications (Phillip Paul) -     POCT glucose (manual entry) -     POCT glycosylated hemoglobin (Hb A1C)  Essential hypertension -     CBC -     CMP14+EGFR Continue all antihypertensives as prescribed.  Remember to bring in your blood pressure log with you for your follow up appointment.  DASH/Mediterranean Diets are healthier choices for HTN.    Mixed hyperlipidemia -     Lipid panel INSTRUCTIONS: Work on a low fat, heart healthy diet and participate in regular aerobic exercise program by working out at least 150 minutes per week; 5 days a week-30 minutes per day. Avoid red meat, fried foods. junk foods, sodas, sugary drinks, unhealthy snacking, alcohol and smoking.  Drink at least 48oz of water per day and monitor your carbohydrate intake daily.   Insomnia, unspecified type -     traZODone (DESYREL) 100 MG tablet; Take 1 tablet (100 mg total) by mouth at bedtime.   Patient has been counseled on age-appropriate routine health concerns for screening and prevention. These are reviewed and up-to-date. Referrals have been placed accordingly. Immunizations are up-to-date or declined.    Subjective:   Chief Complaint  Patient presents with  . Diabetes   HPI Phillip Paul 68 y.o. male presents to office today for follow-up to diabetes mellitus type 2, essential hypertension, and mixed hyperlipidemia.  Type 2 Diabetes Mellitus Disease course has been improving. There are no hypoglycemic symptoms. There are no hypoglycemic complications. Symptoms are stable. There are diabetic complications. Risk factors for coronary artery disease include family history, dyslipidemia, diabetes mellitus, obesity, hypertension, sedentary lifestyle and stress. Current diabetic treatment includes Levemir 15 units nightly, metformin 500 mg twice daily.   Patient is compliant with treatment all of the time and monitors blood glucose at home 2 times per day.   Home blood glucose trend : (FBS 90 mg/dl)  Weight is  stable. Patient follows a generally healthy diet. Meal planning includes avoidance of concentrated sweets. Patient has not seen a dietician. Patient is compliant with exercise.   An ACE inhibitor/angiotensin II receptor blocker is being taken. Patient does not see a podiatrist. Eye exam is not current.  Lab Results  Component Value Date   HGBA1C 7.2 (A) 02/13/2018   Lab Results  Component Value Date   HGBA1C 8.0 (A) 11/11/2017    CHRONIC HYPERTENSION Disease Monitoring  Blood pressure range BP Readings from Last 3 Encounters:  02/13/18 114/74  11/12/17 132/84  11/11/17 126/88  Weight is down 3lbs Chest pain: no   Dyspnea: no   Claudication: no  Medication compliance: yes, losartan 100 mg daily, amlodipine 10 mg daily Medication Side Effects  Lightheadedness: no   Urinary frequency: no   Edema: no   Impotence: yes  Preventitive Healthcare:  Exercise: no   Diet Pattern: diet: general  Salt Restriction:  no   Hyperlipidemia Patient presents for follow up to hyperlipidemia.  He is medication compliant taking pravastatin 20 mg daily. He is diet compliant and denies statin intolerance including myalgias.  LDL is not at goal of less than 70. Lab Results  Component Value Date   CHOL 147 08/06/2017   Lab Results  Component Value Date   HDL 48 08/06/2017   Lab Results  Component Value Date   LDLCALC 76 08/06/2017   Lab Results  Component Value Date   TRIG 116 08/06/2017   Lab Results  Component Value Date   CHOLHDL 3.1 08/06/2017    Review of Systems  Constitutional: Negative for fever, malaise/fatigue and weight loss.  HENT: Negative.  Negative for nosebleeds.   Eyes: Negative.  Negative for blurred vision, double vision and photophobia.  Respiratory: Negative.  Negative for cough and shortness of breath.     Cardiovascular: Negative.  Negative for chest pain, palpitations and leg swelling.  Gastrointestinal: Negative.  Negative for heartburn, nausea and vomiting.  Musculoskeletal: Positive for joint pain (lower left leg). Negative for myalgias.  Neurological: Negative.  Negative for dizziness, focal weakness, seizures and headaches.  Psychiatric/Behavioral: Negative.  Negative for suicidal ideas.    Past Medical History:  Diagnosis Date  . Abnormal EKG   . Anxiety   . Diabetes mellitus without complication ([facility])   . Dizzy   . Hypertension   . Kidney stones   . Racing heart beat     Past Surgical History:  Procedure Laterality Date  . CYSTOSCOPY     LEFT URETEROSCOPIC STONE MANIPULATION AND REMOVAL; PLACEMENT OF  LEFT DOUBLE-J URETERAL STENT  . CYSTOSCOPY W/ URETERAL STENT PLACEMENT     LEFT DOUBLE-J URETERAL STENT  . ORIF ANKLE FRACTURE Left 08/17/2012   Procedure: OPEN REDUCTION INTERNAL FIXATION (ORIF) ANKLE FRACTURE/Left;  Surgeon: Wylene Simmer, MD;  Location: [facility];  Service: Orthopedics;  Laterality: Left;  . TIBIA IM NAIL INSERTION Left 08/17/2012   Procedure: INTRAMEDULLARY (IM) NAIL TIBIAL/Left;  Surgeon: Wylene Simmer, MD;  Location: [facility];  Service: Orthopedics;  Laterality: Left;     Family History  Problem Relation Age of Onset  . Hypertension Father        lived to 58  . Other Brother        MVA  . Kidney disease Sister   . Other Brother        Stomach problems    Social History Reviewed with no changes to be made today.   Outpatient Medications Prior to Visit  Medication Sig Dispense Refill  . Blood Glucose Monitoring Suppl (TRUE METRIX METER) w/Device KIT Check blood sugars three times per day 1 kit 0  . cholecalciferol (VITAMIN D) 1000 units tablet Take 1,000 Units by mouth daily.    . fish oil-omega-3 fatty acids 1000 MG capsule Take 1 g by mouth daily.    . fluticasone (FLONASE) 50 MCG/ACT nasal spray Place 2 sprays into both nostrils daily. 16 g 6  . glucose  blood (TRUE METRIX BLOOD GLUCOSE TEST) test strip Check blood sugar 3x a day. 100 each 12  . Insulin Detemir (LEVEMIR FLEXTOUCH) 100 UNIT/ML Pen Inject 15 Units into the skin daily at 10 pm. 15 mL 11  . Insulin Pen Needle 31G X 5 MM MISC Use as instructed. 100 each 12  . nortriptyline (PAMELOR) 25 MG capsule Take 3 capsules (75 mg total) by mouth every evening. 90 capsule 3  . nortriptyline (PAMELOR) 25 MG capsule Take 3 capsules (75 mg total) by mouth at bedtime. 90 capsule 3  . pravastatin (PRAVACHOL) 20 MG tablet Take 1 tablet (20 mg total) by mouth daily. 90 tablet 3  . TRUEPLUS LANCETS 28G MISC Use as instructed 100 each 3  . traZODone (DESYREL) 100 MG tablet Take 1 tablet (100 mg total) by mouth at bedtime. 90 tablet 0  . amLODipine (NORVASC) 10 MG tablet Take 1 tablet (10 mg total) by mouth daily. 90 tablet 1  .  losartan (COZAAR) 100 MG tablet TAKE 1 TABLET (100 MG TOTAL) BY MOUTH DAILY. 90 tablet 0  . meclizine (ANTIVERT) 12.5 MG tablet Take 1 tablet (12.5 mg total) by mouth 3 (three) times daily as needed for dizziness. (Patient not taking: Reported on 11/11/2017) 60 tablet 0  . metFORMIN (GLUCOPHAGE) 500 MG tablet Take 1 tablet (500 mg total) by mouth 2 (two) times daily with a meal. 180 tablet 3   No facility-administered medications prior to visit.     No Known Allergies     Objective:    BP 114/74   Pulse 96   Temp (!) 97.5 F (36.4 C) (Oral)   Ht 5' 9"  (1.753 m)   Wt 210 lb 3.2 oz (95.3 kg)   SpO2 94%   BMI 31.04 kg/m  Wt Readings from Last 3 Encounters:  02/13/18 210 lb 3.2 oz (95.3 kg)  11/12/17 207 lb (93.9 kg)  11/11/17 212 lb (96.2 kg)    Physical Exam Vitals signs and nursing note reviewed.  Constitutional:      Appearance: He is well-developed.  HENT:     Head: Normocephalic and atraumatic.  Neck:     Musculoskeletal: Normal range of motion.  Cardiovascular:     Rate and Rhythm: Normal rate and regular rhythm.     Heart sounds: Normal heart sounds.  No murmur. No friction rub. No gallop.   Pulmonary:     Effort: Pulmonary effort is normal. No tachypnea or respiratory distress.     Breath sounds: Normal breath sounds. No decreased breath sounds, wheezing, rhonchi or rales.  Chest:     Chest wall: No tenderness.  Abdominal:     General: Bowel sounds are normal.     Palpations: Abdomen is soft.  Musculoskeletal: Normal range of motion.  Skin:    General: Skin is warm and dry.  Neurological:     Mental Status: He is alert and oriented to person, place, and time.     Coordination: Coordination normal.  Psychiatric:        Behavior: Behavior normal. Behavior is cooperative.        Thought Content: Thought content normal.        Judgment: Judgment normal.          Patient has been counseled extensively about nutrition and exercise as well as the importance of adherence with medications and regular follow-up. The patient was given clear instructions to go to ER or return to medical center if symptoms don't improve, worsen or new problems develop. The patient verbalized understanding.   Follow-up: Return in about 3 months (around 05/14/2018) for HTN/HPL/DM.   Gildardo Pounds, FNP-BC [facility] [place], [place]   02/13/2018, 2:51 PM

## 2018-02-14 LAB — CBC
Hematocrit: 43.8 % (ref 37.5–51.0)
Hemoglobin: 15.2 g/dL (ref 13.0–17.7)
MCH: 29.4 pg (ref 26.6–33.0)
MCHC: 34.7 g/dL (ref 31.5–35.7)
MCV: 85 fL (ref 79–97)
Platelets: 242 10*3/uL (ref 150–450)
RBC: 5.17 x10E6/uL (ref 4.14–5.80)
RDW: 13 % (ref 11.6–15.4)
WBC: 6.6 10*3/uL (ref 3.4–10.8)

## 2018-02-14 LAB — CMP14+EGFR
ALT: 79 IU/L — ABNORMAL HIGH (ref 0–44)
AST: 41 IU/L — ABNORMAL HIGH (ref 0–40)
Albumin/Globulin Ratio: 1.3 (ref 1.2–2.2)
Albumin: 4.3 g/dL (ref 3.8–4.8)
Alkaline Phosphatase: 96 IU/L (ref 39–117)
BUN / CREAT RATIO: 14 (ref 10–24)
BUN: 11 mg/dL (ref 8–27)
Bilirubin Total: 0.3 mg/dL (ref 0.0–1.2)
CO2: 22 mmol/L (ref 20–29)
Calcium: 9.2 mg/dL (ref 8.6–10.2)
Chloride: 100 mmol/L (ref 96–106)
Creatinine, Ser: 0.76 mg/dL (ref 0.76–1.27)
GFR calc Af Amer: 109 mL/min/{1.73_m2} (ref >59)
GFR calc non Af Amer: 94 mL/min/{1.73_m2} (ref >59)
Globulin, Total: 3.4 g/dL (ref 1.5–4.5)
Glucose: 116 mg/dL — ABNORMAL HIGH (ref 65–99)
Potassium: 4.3 mmol/L (ref 3.5–5.2)
Sodium: 137 mmol/L (ref 134–144)
Total Protein: 7.7 g/dL (ref 6.0–8.5)

## 2018-02-14 LAB — LIPID PANEL
Chol/HDL Ratio: 3.3 ratio (ref 0.0–5.0)
Cholesterol, Total: 137 mg/dL (ref 100–199)
HDL: 42 mg/dL (ref >39)
LDL Calculated: 67 mg/dL (ref 0–99)
Triglycerides: 139 mg/dL (ref 0–149)
VLDL Cholesterol Cal: 28 mg/dL (ref 5–40)

## 2018-02-19 ENCOUNTER — Telehealth: Payer: Self-pay

## 2018-02-19 NOTE — Telephone Encounter (Signed)
-----   Message from Gildardo Pounds, NP sent at 02/18/2018 10:37 PM EST ----- Labs are essentially normal. There are some minor variations in your blood work that do not require any additional work up at this time. Will continue to monitor. Make sure you are drinking at least 48 oz of water per day. Work on eating a low fat, heart healthy diet and participate in regular aerobic exercise program to control as well. Exercise at least  30 minutes per day-5 days per week. Avoid red meat. No fried foods. No junk foods, sodas, sugary foods or drinks, unhealthy snacking, alcohol or smoking.

## 2018-02-19 NOTE — Telephone Encounter (Signed)
CMA spoke to patient to inform on results.  Pt. Understood. Pt. Verified DOB.
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
## 2018-03-05 ENCOUNTER — Encounter: Payer: Self-pay | Admitting: Neurology

## 2018-03-05 ENCOUNTER — Ambulatory Visit (INDEPENDENT_AMBULATORY_CARE_PROVIDER_SITE_OTHER): Payer: Medicare Other | Admitting: Neurology

## 2018-03-05 VITALS — BP 134/86 | HR 104 | Ht 69.0 in | Wt 208.0 lb

## 2018-03-05 DIAGNOSIS — G44329 Chronic post-traumatic headache, not intractable: Secondary | ICD-10-CM | POA: Diagnosis not present

## 2018-03-05 MED ORDER — NORTRIPTYLINE HCL 50 MG PO CAPS: 100.0000 mg | ORAL_CAPSULE | Freq: Every day | ORAL | 5 refills | Status: DC

## 2018-03-05 NOTE — Patient Instructions (Signed)
1.  Increase nortriptyline.  Will prescribe 50mg  pill.  Take 1 in evening and 1 at bedtime 2.  Follow up in 5 months.

## 2018-05-07 ENCOUNTER — Other Ambulatory Visit: Payer: Self-pay | Admitting: Nurse Practitioner

## 2018-05-07 DIAGNOSIS — I10 Essential (primary) hypertension: Secondary | ICD-10-CM

## 2018-05-07 DIAGNOSIS — G4700 Insomnia, unspecified: Secondary | ICD-10-CM

## 2018-05-07 MED ORDER — TRAZODONE HCL 100 MG PO TABS: 100.0000 mg | ORAL_TABLET | Freq: Every day | ORAL | 0 refills | Status: DC

## 2018-05-07 MED ORDER — LOSARTAN POTASSIUM 100 MG PO TABS: 100.0000 mg | ORAL_TABLET | Freq: Every day | ORAL | 0 refills | Status: DC

## 2018-05-07 MED ORDER — AMLODIPINE BESYLATE 10 MG PO TABS: 10.0000 mg | ORAL_TABLET | Freq: Every day | ORAL | 0 refills | Status: DC

## 2018-05-07 NOTE — Telephone Encounter (Signed)
1) Medication(s) Requested (by name): traZODone (DESYREL) 100 MG tablet losartan (COZAAR) 100 MG tablet  amLODipine (NORVASC) 10 MG tablet   2) Pharmacy of Choice: [facility] [address], [place] - [address] 3) Special Requests:   Approved medications will be sent to the pharmacy, we will reach out if there is an issue.  Requests made after 3pm may not be addressed until the following business day!  If a patient is unsure of the name of the medication(s) please note and ask patient to call back when they are able to provide all info, do not send to responsible party until all information is available!

## 2018-05-13 ENCOUNTER — Ambulatory Visit: Payer: Medicare Other | Admitting: Nurse Practitioner

## 2018-05-29 ENCOUNTER — Other Ambulatory Visit: Payer: Self-pay

## 2018-05-29 ENCOUNTER — Ambulatory Visit: Payer: Medicare Other | Attending: Nurse Practitioner | Admitting: Nurse Practitioner

## 2018-05-29 ENCOUNTER — Encounter: Payer: Self-pay | Admitting: Nurse Practitioner

## 2018-05-29 VITALS — BP 127/86 | HR 100 | Temp 98.7°F | Ht 69.0 in | Wt 214.8 lb

## 2018-05-29 DIAGNOSIS — G894 Chronic pain syndrome: Secondary | ICD-10-CM

## 2018-05-29 DIAGNOSIS — E1165 Type 2 diabetes mellitus with hyperglycemia: Secondary | ICD-10-CM | POA: Diagnosis not present

## 2018-05-29 DIAGNOSIS — Z886 Allergy status to analgesic agent status: Secondary | ICD-10-CM | POA: Insufficient documentation

## 2018-05-29 DIAGNOSIS — G8929 Other chronic pain: Secondary | ICD-10-CM | POA: Insufficient documentation

## 2018-05-29 DIAGNOSIS — Z7951 Long term (current) use of inhaled steroids: Secondary | ICD-10-CM | POA: Diagnosis not present

## 2018-05-29 DIAGNOSIS — Z794 Long term (current) use of insulin: Secondary | ICD-10-CM | POA: Diagnosis not present

## 2018-05-29 DIAGNOSIS — E118 Type 2 diabetes mellitus with unspecified complications: Secondary | ICD-10-CM

## 2018-05-29 DIAGNOSIS — Z8249 Family history of ischemic heart disease and other diseases of the circulatory system: Secondary | ICD-10-CM | POA: Diagnosis not present

## 2018-05-29 DIAGNOSIS — E782 Mixed hyperlipidemia: Secondary | ICD-10-CM | POA: Diagnosis not present

## 2018-05-29 DIAGNOSIS — I10 Essential (primary) hypertension: Secondary | ICD-10-CM | POA: Diagnosis not present

## 2018-05-29 DIAGNOSIS — F419 Anxiety disorder, unspecified: Secondary | ICD-10-CM | POA: Insufficient documentation

## 2018-05-29 DIAGNOSIS — IMO0002 Reserved for concepts with insufficient information to code with codable children: Secondary | ICD-10-CM

## 2018-05-29 DIAGNOSIS — Z79899 Other long term (current) drug therapy: Secondary | ICD-10-CM | POA: Insufficient documentation

## 2018-05-29 LAB — POCT GLYCOSYLATED HEMOGLOBIN (HGB A1C): Hemoglobin A1C: 7.1 % — AB (ref 4.0–5.6)

## 2018-05-29 LAB — GLUCOSE, POCT (MANUAL RESULT ENTRY): POC Glucose: 145 mg/dl — AB (ref 70–99)

## 2018-05-29 MED ORDER — GLUCOSE BLOOD VI STRP: ORAL_STRIP | 4 refills | Status: DC

## 2018-05-29 NOTE — Progress Notes (Signed)
Assessment & Plan:  Phillip Paul was seen today for follow-up.  Diagnoses and all orders for this visit:  Diabetes mellitus type 2, uncontrolled, with complications (HCC) -     Glucose (CBG) -     HgB A1c -     glucose blood (TRUE METRIX BLOOD GLUCOSE TEST) test strip; Check blood sugar by fingerstick 2x a day. ICD10 E11.8 and E11.65 Diabetes is poorly controlled. Advised patient to keep a fasting blood sugar log fast, 2 hours post lunch and bedtime which will be reviewed at the next office visit.   Essential hypertension Continue all antihypertensives as prescribed.  Remember to bring in your blood pressure log with you for your follow up appointment.  DASH/Mediterranean Diets are healthier choices for HTN.    Chronic pain disorder -     Ambulatory referral to Pain Clinic    Patient has been counseled on age-appropriate routine health concerns for screening and prevention. These are reviewed and up-to-date. Referrals have been placed accordingly. Immunizations are up-to-date or declined.    Subjective:   Chief Complaint  Patient presents with  . Follow-up    Pt. is here for DM refills.    HPI Phillip Paul 68 y.o. male presents to office today for follow up to HTN and DM Type 2. He is seeing Neurology Dr. Tomi Likens for headaches. Taking Nortriptyline 100 mg QHS. He has a history of chronic pain. Had previously been on Opioids for numerous years which were prescribed by his PCP. He decided to wean off all of his pain medications and had been going to the methadone clinic. Now he states his pain is not controlled and he would like to stop taking methadone and restart his opioids. He will need to be seen by a pain specialist in order to help him with this.  He continues to smoke.   Essential Hypertension Blood pressure is up as well as 6 lb weight gain. Current medications include amlodipine 10 mg daily and losartan 100 mg daily. He endorses medication compliance taking  both antihypertensives. Checking blood pressure at home with average readings 120/70s BP Readings from Last 3 Encounters:  05/29/18 127/86  03/05/18 134/86  02/13/18 114/74    DM TYPE 2 Current medications include metformin 500 mg BID, levemir 15unit daily. He is taking a statin and ARB. A1C somewhat controlled however not at goal <7. Lab Results  Component Value Date   HGBA1C 7.1 (A) 05/29/2018   Mixed Hyperlipidemia LDL at goal. Taking pravastatin 20 mg and omega 3 fish oil 1 gm daily. Denies statin intolerance or myalgias.  Lab Results  Component Value Date   LDLCALC 67 02/13/2018    Review of Systems  Constitutional: Negative for fever, malaise/fatigue and weight loss.  HENT: Negative.  Negative for nosebleeds.   Eyes: Negative.  Negative for blurred vision, double vision and photophobia.  Respiratory: Negative.  Negative for cough and shortness of breath.   Cardiovascular: Negative.  Negative for chest pain, palpitations and leg swelling.  Gastrointestinal: Negative.  Negative for heartburn, nausea and vomiting.  Musculoskeletal: Negative.  Negative for myalgias.  Neurological: Negative.  Negative for dizziness, focal weakness, seizures and headaches.  Psychiatric/Behavioral: Negative.  Negative for suicidal ideas.    Past Medical History:  Diagnosis Date  . Abnormal EKG   . Anxiety   . Diabetes mellitus without complication ([place])   . Dizzy   . Hypertension   . Kidney stones   . Racing heart beat  Past Surgical History:  Procedure Laterality Date  . CYSTOSCOPY     LEFT URETEROSCOPIC STONE MANIPULATION AND REMOVAL; PLACEMENT OF  LEFT DOUBLE-J URETERAL STENT  . CYSTOSCOPY W/ URETERAL STENT PLACEMENT     LEFT DOUBLE-J URETERAL STENT  . ORIF ANKLE FRACTURE Left 08/17/2012   Procedure: OPEN REDUCTION INTERNAL FIXATION (ORIF) ANKLE FRACTURE/Left;  Surgeon: Wylene Simmer, MD;  Location: [facility];  Service: Orthopedics;  Laterality: Left;  . TIBIA IM NAIL INSERTION Left  08/17/2012   Procedure: INTRAMEDULLARY (IM) NAIL TIBIAL/Left;  Surgeon: Wylene Simmer, MD;  Location: [facility];  Service: Orthopedics;  Laterality: Left;    Family History  Problem Relation Age of Onset  . Hypertension Father        lived to 23  . Other Brother        MVA  . Kidney disease Sister   . Other Brother        Stomach problems    Social History Reviewed with no changes to be made today.   Outpatient Medications Prior to Visit  Medication Sig Dispense Refill  . amLODipine (NORVASC) 10 MG tablet TAKE 1 TABLET (10 MG TOTAL) BY MOUTH DAILY. 90 tablet 0  . Blood Glucose Monitoring Suppl (TRUE METRIX METER) w/Device KIT Check blood sugars three times per day 1 kit 0  . cholecalciferol (VITAMIN D) 1000 units tablet Take 1,000 Units by mouth daily.    . fish oil-omega-3 fatty acids 1000 MG capsule Take 1 g by mouth daily.    . fluticasone (FLONASE) 50 MCG/ACT nasal spray Place 2 sprays into both nostrils daily. 16 g 6  . Insulin Detemir (LEVEMIR FLEXTOUCH) 100 UNIT/ML Pen Inject 15 Units into the skin daily at 10 pm. 15 mL 11  . Insulin Pen Needle 31G X 5 MM MISC Use as instructed. 100 each 12  . losartan (COZAAR) 100 MG tablet TAKE 1 TABLET (100 MG TOTAL) BY MOUTH DAILY. 90 tablet 0  . nortriptyline (PAMELOR) 50 MG capsule Take 2 capsules (100 mg total) by mouth at bedtime. 60 capsule 5  . pravastatin (PRAVACHOL) 20 MG tablet Take 1 tablet (20 mg total) by mouth daily. 90 tablet 3  . traZODone (DESYREL) 100 MG tablet Take 1 tablet (100 mg total) by mouth at bedtime. 90 tablet 0  . TRUEPLUS LANCETS 28G MISC Use as instructed 100 each 3  . glucose blood (TRUE METRIX BLOOD GLUCOSE TEST) test strip Check blood sugar 3x a day. 100 each 12  . metFORMIN (GLUCOPHAGE) 500 MG tablet Take 1 tablet (500 mg total) by mouth 2 (two) times daily with a meal. 180 tablet 3   No facility-administered medications prior to visit.     Allergies  Allergen Reactions  . Tylenol [Acetaminophen] Other (See  Comments)    ELEVATED LFTs       Objective:    BP 127/86 (BP Location: Left Arm, Patient Position: Sitting, Cuff Size: Normal)   Pulse 100   Temp 98.7 F (37.1 C) (Oral)   Ht 5' 9"  (1.753 m)   Wt 214 lb 12.8 oz (97.4 kg)   SpO2 93%   BMI 31.72 kg/m  Wt Readings from Last 3 Encounters:  05/29/18 214 lb 12.8 oz (97.4 kg)  03/05/18 208 lb (94.3 kg)  02/13/18 210 lb 3.2 oz (95.3 kg)    Physical Exam Vitals signs and nursing note reviewed.  Constitutional:      Appearance: He is well-developed.  HENT:     Head: Normocephalic and atraumatic.  Neck:     Musculoskeletal: Normal range of motion.  Cardiovascular:     Rate and Rhythm: Normal rate and regular rhythm.     Heart sounds: Normal heart sounds. No murmur. No friction rub. No gallop.   Pulmonary:     Effort: Pulmonary effort is normal. No tachypnea or respiratory distress.     Breath sounds: Normal breath sounds. No decreased breath sounds, wheezing, rhonchi or rales.  Chest:     Chest wall: No tenderness.  Abdominal:     General: Bowel sounds are normal.     Palpations: Abdomen is soft.  Musculoskeletal: Normal range of motion.  Skin:    General: Skin is warm and dry.  Neurological:     Mental Status: He is alert and oriented to person, place, and time.     Coordination: Coordination normal.  Psychiatric:        Behavior: Behavior normal. Behavior is cooperative.        Thought Content: Thought content normal.        Judgment: Judgment normal.       Patient has been counseled extensively about nutrition and exercise as well as the importance of adherence with medications and regular follow-up. The patient was given clear instructions to go to ER or return to medical center if symptoms don't improve, worsen or new problems develop. The patient verbalized understanding.  Total face to face time spent with patient was 28 min. Greater than 50 % of this visit was spent face to face counseling and coordinating care  regarding risk factor modification, compliance importance and encouragement, education related to Diabetes, HTN and Hyperlipidemia.  Follow-up: Return in about 3 months (around 08/29/2018).   Gildardo Pounds, FNP-BC [facility], [place]   06/01/2018, 9:51 PM

## 2018-06-02 ENCOUNTER — Encounter: Payer: Self-pay | Admitting: Nurse Practitioner

## 2018-06-02 ENCOUNTER — Other Ambulatory Visit: Payer: Self-pay | Admitting: Pharmacist

## 2018-06-02 MED ORDER — GLUCOSE BLOOD VI STRP: ORAL_STRIP | 11 refills | Status: DC

## 2018-06-02 MED ORDER — ONETOUCH ULTRA 2 W/DEVICE KIT: PACK | 0 refills | Status: DC
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
## 2018-08-03 ENCOUNTER — Other Ambulatory Visit: Payer: Self-pay

## 2018-08-03 ENCOUNTER — Encounter: Payer: Self-pay | Admitting: Neurology

## 2018-08-03 ENCOUNTER — Telehealth (INDEPENDENT_AMBULATORY_CARE_PROVIDER_SITE_OTHER): Payer: Medicare Other | Admitting: Neurology

## 2018-08-03 VITALS — Temp 98.0°F | Ht 70.0 in | Wt 204.0 lb

## 2018-08-03 DIAGNOSIS — G44219 Episodic tension-type headache, not intractable: Secondary | ICD-10-CM

## 2018-08-06 ENCOUNTER — Other Ambulatory Visit: Payer: Self-pay | Admitting: Family Medicine

## 2018-08-06 ENCOUNTER — Other Ambulatory Visit: Payer: Self-pay | Admitting: Nurse Practitioner

## 2018-08-06 DIAGNOSIS — I10 Essential (primary) hypertension: Secondary | ICD-10-CM

## 2018-08-17 ENCOUNTER — Other Ambulatory Visit: Payer: Self-pay | Admitting: Nurse Practitioner

## 2018-08-24 ENCOUNTER — Other Ambulatory Visit: Payer: Self-pay | Admitting: Nurse Practitioner

## 2018-08-26 ENCOUNTER — Other Ambulatory Visit: Payer: Self-pay | Admitting: Nurse Practitioner

## 2018-08-26 DIAGNOSIS — R42 Dizziness and giddiness: Secondary | ICD-10-CM

## 2018-08-31 ENCOUNTER — Ambulatory Visit: Payer: Medicare Other | Attending: Nurse Practitioner | Admitting: Nurse Practitioner

## 2018-08-31 ENCOUNTER — Encounter: Payer: Self-pay | Admitting: Nurse Practitioner

## 2018-08-31 ENCOUNTER — Other Ambulatory Visit: Payer: Self-pay

## 2018-08-31 VITALS — BP 138/80 | HR 102 | Temp 98.5°F | Ht 69.0 in | Wt 215.0 lb

## 2018-08-31 DIAGNOSIS — Z7951 Long term (current) use of inhaled steroids: Secondary | ICD-10-CM | POA: Diagnosis not present

## 2018-08-31 DIAGNOSIS — Z8249 Family history of ischemic heart disease and other diseases of the circulatory system: Secondary | ICD-10-CM | POA: Insufficient documentation

## 2018-08-31 DIAGNOSIS — I10 Essential (primary) hypertension: Secondary | ICD-10-CM | POA: Insufficient documentation

## 2018-08-31 DIAGNOSIS — IMO0002 Reserved for concepts with insufficient information to code with codable children: Secondary | ICD-10-CM

## 2018-08-31 DIAGNOSIS — Z794 Long term (current) use of insulin: Secondary | ICD-10-CM | POA: Insufficient documentation

## 2018-08-31 DIAGNOSIS — Z841 Family history of disorders of kidney and ureter: Secondary | ICD-10-CM | POA: Insufficient documentation

## 2018-08-31 DIAGNOSIS — E119 Type 2 diabetes mellitus without complications: Secondary | ICD-10-CM | POA: Insufficient documentation

## 2018-08-31 DIAGNOSIS — R51 Headache: Secondary | ICD-10-CM

## 2018-08-31 DIAGNOSIS — F419 Anxiety disorder, unspecified: Secondary | ICD-10-CM | POA: Insufficient documentation

## 2018-08-31 DIAGNOSIS — E782 Mixed hyperlipidemia: Secondary | ICD-10-CM | POA: Insufficient documentation

## 2018-08-31 DIAGNOSIS — Z79899 Other long term (current) drug therapy: Secondary | ICD-10-CM | POA: Diagnosis not present

## 2018-08-31 DIAGNOSIS — Z87442 Personal history of urinary calculi: Secondary | ICD-10-CM | POA: Diagnosis not present

## 2018-08-31 DIAGNOSIS — Z886 Allergy status to analgesic agent status: Secondary | ICD-10-CM | POA: Insufficient documentation

## 2018-08-31 DIAGNOSIS — E1165 Type 2 diabetes mellitus with hyperglycemia: Secondary | ICD-10-CM

## 2018-08-31 DIAGNOSIS — M25572 Pain in left ankle and joints of left foot: Secondary | ICD-10-CM | POA: Diagnosis not present

## 2018-08-31 LAB — POCT GLYCOSYLATED HEMOGLOBIN (HGB A1C): Hemoglobin A1C: 6.9 % — AB (ref 4.0–5.6)

## 2018-08-31 LAB — GLUCOSE, POCT (MANUAL RESULT ENTRY): POC Glucose: 129 mg/dl — AB (ref 70–99)

## 2018-08-31 MED ORDER — METFORMIN HCL 500 MG PO TABS: 500.0000 mg | ORAL_TABLET | Freq: Two times a day (BID) | ORAL | 3 refills | Status: DC

## 2018-08-31 NOTE — Progress Notes (Signed)
Assessment & Plan:  Phillip Paul was seen today for follow-up.  Diagnoses and all orders for this visit:  Diabetes mellitus type 2, uncontrolled, with complications (HCC) -     Glucose (CBG) -     HgB A1c -     metFORMIN (GLUCOPHAGE) 500 MG tablet; Take 1 tablet (500 mg total) by mouth 2 (two) times daily with a meal. Continue blood sugar control as discussed in office today, low carbohydrate diet, and regular physical exercise as tolerated, 150 minutes per week (30 min each day, 5 days per week, or 50 min 3 days per week). Keep blood sugar logs with fasting goal of 90-130 mg/dl, post prandial (after you eat) less than 180.  For Hypoglycemia: BS <60 and Hyperglycemia BS >400; contact the clinic ASAP. Annual eye exams and foot exams are recommended.  Essential hypertension -     Basic metabolic panel Continue all antihypertensives as prescribed.  Remember to bring in your blood pressure log with you for your follow up appointment.  DASH/Mediterranean Diets are healthier choices for HTN.    Mixed hyperlipidemia INSTRUCTIONS: Work on a low fat, heart healthy diet and participate in regular aerobic exercise program by working out at least 150 minutes per week; 5 days a week-30 minutes per day. Avoid red meat, fried foods. junk foods, sodas, sugary drinks, unhealthy snacking, alcohol and smoking.  Drink at least 48oz of water per day and monitor your carbohydrate intake daily.    Patient has been counseled on age-appropriate routine health concerns for screening and prevention. These are reviewed and up-to-date. Referrals have been placed accordingly. Immunizations are up-to-date or declined.    Subjective:   Chief Complaint  Patient presents with  . Follow-up    Pt. is here for diabetes follow up.    HPI Phillip Paul 68 y.o. male presents to office today for follow up.  has a past medical history of Abnormal EKG, Anxiety, Diabetes mellitus without complication ([place]),  Dizzy, Hypertension, Kidney stones, and Racing heart beat.     Essential Hypertension Taking losartan 100 mg daily and amlodipine 10 mg daily as prescribed. Denies chest pain, shortness of breath, palpitations, lightheadedness, dizziness, headaches or BLE edema. He is diet and exercise complaint however taking methadone for chronic pain management and this seems to cause weight gain for him. Walks 5 miles per day.  BP Readings from Last 3 Encounters:  08/31/18 138/80  05/29/18 127/86  03/05/18 134/86    Mixed Hyperlipidemia Taking pravastatin 20 mg daily as prescribed. Denies any statin intolerance or myalgias. LDL at goal <70. Lab Results  Component Value Date   LDLCALC 67 02/13/2018    DM TYPE 2 Chronic and well controlled. He is taking levemir 15units daily and metformin 500 mg BID. Denies any hypoglycemic symptoms. He states his levemir pricing has increased. I have instructed him to contact his insurance companied regarding a more affordable alternative.  Lab Results  Component Value Date   HGBA1C 6.9 (A) 08/31/2018   Lab Results  Component Value Date   HGBA1C 6.9 (A) 08/31/2018    CPS He was referred to [facility] for pain management. Unfortunately they were unable to leave a voice mail on his cell phone. I have given him the number of their office and will also place a new referral. He has a history of chronic pain. Had previously been on Opioids for numerous years which were prescribed by his PCP. He decided to wean off all of his pain medications  and had been going to the methadone clinic. Now he states his pain is not controlled and he would like to stop taking methadone and restart his opioids. He will need to be seen by a pain specialist in order to help him with this.  He continues to smoke.   Review of Systems  Constitutional: Negative for fever, malaise/fatigue and weight loss.  HENT: Negative.  Negative for nosebleeds.   Eyes: Negative.  Negative for blurred vision, double  vision and photophobia.  Respiratory: Negative.  Negative for cough and shortness of breath.   Cardiovascular: Negative.  Negative for chest pain, palpitations and leg swelling.  Gastrointestinal: Negative.  Negative for heartburn, nausea and vomiting.  Musculoskeletal: Negative.  Negative for myalgias.       CPS  Neurological: Positive for headaches. Negative for dizziness, focal weakness and seizures.  Psychiatric/Behavioral: Negative.  Negative for suicidal ideas.    Past Medical History:  Diagnosis Date  . Abnormal EKG   . Anxiety   . Diabetes mellitus without complication ([place])   . Dizzy   . Hypertension   . Kidney stones   . Racing heart beat     Past Surgical History:  Procedure Laterality Date  . CYSTOSCOPY     LEFT URETEROSCOPIC STONE MANIPULATION AND REMOVAL; PLACEMENT OF  LEFT DOUBLE-J URETERAL STENT  . CYSTOSCOPY W/ URETERAL STENT PLACEMENT     LEFT DOUBLE-J URETERAL STENT  . ORIF ANKLE FRACTURE Left 08/17/2012   Procedure: OPEN REDUCTION INTERNAL FIXATION (ORIF) ANKLE FRACTURE/Left;  Surgeon: Wylene Simmer, MD;  Location: [facility];  Service: Orthopedics;  Laterality: Left;  . TIBIA IM NAIL INSERTION Left 08/17/2012   Procedure: INTRAMEDULLARY (IM) NAIL TIBIAL/Left;  Surgeon: Wylene Simmer, MD;  Location: [facility];  Service: Orthopedics;  Laterality: Left;    Family History  Problem Relation Age of Onset  . Hypertension Father        lived to 52  . Other Brother        MVA  . Kidney disease Sister   . Other Brother        Stomach problems    Social History Reviewed with no changes to be made today.   Outpatient Medications Prior to Visit  Medication Sig Dispense Refill  . amLODipine (NORVASC) 10 MG tablet Take 1 tablet by mouth daily 90 tablet 0  . Blood Glucose Monitoring Suppl (ONE TOUCH ULTRA 2) w/Device KIT Check blood sugar by fingerstick 2x a day. ICD10 E11.8 and E11.65 1 each 0  . cholecalciferol (VITAMIN D) 1000 units tablet Take 1,000 Units by mouth daily.     . fish oil-omega-3 fatty acids 1000 MG capsule Take 1 g by mouth daily.    . fluticasone (FLONASE) 50 MCG/ACT nasal spray PLACE 2 SPRAYS INTO BOTH NOSTRILS DAILY. 16 g 2  . glucose blood (ONE TOUCH ULTRA TEST) test strip Check blood sugar by fingerstick 2x a day. ICD10 E11.8 and E11.65 100 each 11  . Insulin Pen Needle 31G X 5 MM MISC Use as instructed. 100 each 12  . LEVEMIR FLEXTOUCH 100 UNIT/ML Pen INJECT 15 UNITS SUBCUTANEOUSLY ONCE DAILY            AT 10 PM 15 mL 0  . losartan (COZAAR) 100 MG tablet Take 1 tablet by mouth once daily 90 tablet 0  . nortriptyline (PAMELOR) 50 MG capsule Take 2 capsules (100 mg total) by mouth at bedtime. 60 capsule 5  . pravastatin (PRAVACHOL) 20 MG tablet TAKE 1 TABLET (20  MG TOTAL) BY MOUTH DAILY. 90 tablet 0  . TRUEPLUS LANCETS 28G MISC Use as instructed 100 each 3  . traZODone (DESYREL) 100 MG tablet Take 1 tablet (100 mg total) by mouth at bedtime. 90 tablet 0  . metFORMIN (GLUCOPHAGE) 500 MG tablet Take 1 tablet (500 mg total) by mouth 2 (two) times daily with a meal. 180 tablet 3   No facility-administered medications prior to visit.     Allergies  Allergen Reactions  . Tylenol [Acetaminophen] Other (See Comments)    ELEVATED LFTs       Objective:    BP 138/80 (BP Location: Left Arm, Patient Position: Sitting, Cuff Size: Normal)   Pulse (!) 102   Temp 98.5 F (36.9 C) (Oral)   Ht _0  (1.753 m)   Wt 215 lb (97.5 kg)   SpO2 93%   BMI 31.75 kg/m  Wt Readings from Last 3 Encounters:  08/31/18 215 lb (97.5 kg)  08/03/18 204 lb (92.5 kg)  05/29/18 214 lb 12.8 oz (97.4 kg)    Physical Exam Vitals signs and nursing note reviewed.  Constitutional:      Appearance: He is well-developed.  HENT:     Head: Normocephalic and atraumatic.  Neck:     Musculoskeletal: Normal range of motion.  Cardiovascular:     Rate and Rhythm: Regular rhythm. Tachycardia present.     Heart sounds: Normal heart sounds. No murmur. No friction rub. No  gallop.   Pulmonary:     Effort: Pulmonary effort is normal. No tachypnea or respiratory distress.     Breath sounds: Normal breath sounds. No decreased breath sounds, wheezing, rhonchi or rales.  Chest:     Chest wall: No tenderness.  Abdominal:     General: Bowel sounds are normal.     Palpations: Abdomen is soft.  Musculoskeletal: Normal range of motion.  Skin:    General: Skin is warm and dry.  Neurological:     Mental Status: He is alert and oriented to person, place, and time.     Coordination: Coordination normal.  Psychiatric:        Behavior: Behavior normal. Behavior is cooperative.        Thought Content: Thought content normal.        Judgment: Judgment normal.          Patient has been counseled extensively about nutrition and exercise as well as the importance of adherence with medications and regular follow-up. The patient was given clear instructions to go to ER or return to medical center if symptoms don't improve, worsen or new problems develop. The patient verbalized understanding.   Follow-up: Return in about 3 months (around 12/01/2018).   Gildardo Pounds, FNP-BC [facility] [place], [place]   08/31/2018, 1:23 PM

## 2018-09-01 LAB — BASIC METABOLIC PANEL
BUN/Creatinine Ratio: 13 (ref 10–24)
BUN: 9 mg/dL (ref 8–27)
CO2: 24 mmol/L (ref 20–29)
Calcium: 9.5 mg/dL (ref 8.6–10.2)
Chloride: 102 mmol/L (ref 96–106)
Creatinine, Ser: 0.72 mg/dL — ABNORMAL LOW (ref 0.76–1.27)
GFR calc Af Amer: 111 mL/min/{1.73_m2} (ref >59)
GFR calc non Af Amer: 96 mL/min/{1.73_m2} (ref >59)
Glucose: 118 mg/dL — ABNORMAL HIGH (ref 65–99)
Potassium: 4.3 mmol/L (ref 3.5–5.2)
Sodium: 141 mmol/L (ref 134–144)

## 2018-09-07 ENCOUNTER — Encounter: Payer: Self-pay | Admitting: Physical Medicine & Rehabilitation

## 2018-09-24 ENCOUNTER — Encounter: Payer: Medicare Other | Attending: Physical Medicine & Rehabilitation | Admitting: Physical Medicine & Rehabilitation

## 2018-09-24 ENCOUNTER — Other Ambulatory Visit: Payer: Self-pay

## 2018-09-24 ENCOUNTER — Encounter: Payer: Self-pay | Admitting: Physical Medicine & Rehabilitation

## 2018-09-24 VITALS — BP 162/86 | HR 120 | Temp 98.3°F | Ht 70.0 in | Wt 214.0 lb

## 2018-09-24 DIAGNOSIS — G8921 Chronic pain due to trauma: Secondary | ICD-10-CM
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
## 2018-09-24 NOTE — Patient Instructions (Addendum)
We do not wean pts from methadone at this clinic.You may talk to the methadone clinic about your desire to be weaned off.   You may need to go to [facility] or another clinic for this. Your primary MD may refer you   I do not see a reason to be on an opioid given that your orthopedist states your fracture is well healed.  Local treatments such as Voltaren gel that is available over the counter may be helpful if applied 3-4 times per day.  Exercise such as walking should be helpful as well. I do not see a problem such as nerve damage, circulation issue or knee  Joint problem on my exam

## 2018-10-01 ENCOUNTER — Other Ambulatory Visit: Payer: Self-pay | Admitting: Nurse Practitioner

## 2018-10-02 ENCOUNTER — Telehealth: Payer: Self-pay | Admitting: Neurology

## 2018-10-02 NOTE — Telephone Encounter (Signed)
Attorney office left vm about a Economist- they are needing to check on this. Please call back. Thanks!

## 2018-10-05 NOTE — Telephone Encounter (Signed)
Calling in again regarding letter.

## 2018-10-07 ENCOUNTER — Telehealth: Payer: Self-pay

## 2018-10-07 NOTE — Telephone Encounter (Addendum)
Called attorney's office regarding needing another copy of form/letter that they are requesting be filled out by Dr. Tomi Likens.   Demetria (from attorney's office) is resending (fax) the form needed for Dr. Tomi Likens to fill out.  Informed office that there is a $100 fee for filling out these forms from attorney offices. Demetria stated as far as the fees for [place] to send an invoice to their fax [phone]. I stated our front desk can take payment over the phone if possible. She stated she didn't handle that part but if in the invoice it can state that is an option and leave your front office number on fax/invoice.   They will want the completed form faxed back to them when MD is done.

## 2018-10-07 NOTE — Telephone Encounter (Addendum)
Received faxed form / letter that [employer] attorney is requesting that Dr. Tomi Likens complete. Dr. Tomi Likens aware- placed in blue folder on his desk for him to complete.

## 2018-10-07 NOTE — Telephone Encounter (Signed)
Letter drafted to patient' attorney for Dr. Tomi Likens to complete the specific questions regarding the exam, evaluation, and treatment of Mr. Schmuck. Office fee for completing requested information is $100.00 billed to [employer], [place]. Letter faxed to [facility] @ [phone].

## 2018-10-16 ENCOUNTER — Telehealth: Payer: Self-pay | Admitting: *Deleted

## 2018-10-16 NOTE — Telephone Encounter (Signed)
Complete/faxed form/letter to--[employer] [phone].

## 2018-10-19 DIAGNOSIS — E119 Type 2 diabetes mellitus without complications: Secondary | ICD-10-CM | POA: Diagnosis not present

## 2018-10-19 DIAGNOSIS — Z0279 Encounter for issue of other medical certificate: Secondary | ICD-10-CM

## 2018-10-19 DIAGNOSIS — Z794 Long term (current) use of insulin: Secondary | ICD-10-CM | POA: Diagnosis not present

## 2018-10-19 DIAGNOSIS — H2513 Age-related nuclear cataract, bilateral: Secondary | ICD-10-CM | POA: Diagnosis not present

## 2018-10-19 LAB — HM DIABETES EYE EXAM

## 2018-10-30 ENCOUNTER — Other Ambulatory Visit: Payer: Self-pay | Admitting: Family Medicine

## 2018-10-30 DIAGNOSIS — I10 Essential (primary) hypertension: Secondary | ICD-10-CM

## 2018-11-13 ENCOUNTER — Other Ambulatory Visit: Payer: Self-pay | Admitting: Nurse Practitioner

## 2018-11-13 DIAGNOSIS — G4700 Insomnia, unspecified: Secondary | ICD-10-CM

## 2018-11-26 ENCOUNTER — Other Ambulatory Visit: Payer: Self-pay | Admitting: Nurse Practitioner

## 2018-12-01 ENCOUNTER — Ambulatory Visit: Payer: Medicare Other | Admitting: Nurse Practitioner

## 2018-12-02 ENCOUNTER — Other Ambulatory Visit: Payer: Self-pay | Admitting: Nurse Practitioner

## 2018-12-03 ENCOUNTER — Other Ambulatory Visit: Payer: Self-pay | Admitting: Family Medicine

## 2019-01-04 ENCOUNTER — Other Ambulatory Visit: Payer: Self-pay

## 2019-01-04 ENCOUNTER — Ambulatory Visit: Payer: Medicare Other | Attending: Nurse Practitioner | Admitting: Nurse Practitioner

## 2019-01-04 ENCOUNTER — Encounter: Payer: Self-pay | Admitting: Nurse Practitioner

## 2019-01-04 DIAGNOSIS — E785 Hyperlipidemia, unspecified: Secondary | ICD-10-CM

## 2019-01-04 DIAGNOSIS — G4700 Insomnia, unspecified: Secondary | ICD-10-CM | POA: Insufficient documentation

## 2019-01-04 DIAGNOSIS — Z794 Long term (current) use of insulin: Secondary | ICD-10-CM | POA: Insufficient documentation

## 2019-01-04 DIAGNOSIS — F419 Anxiety disorder, unspecified: Secondary | ICD-10-CM | POA: Diagnosis not present

## 2019-01-04 DIAGNOSIS — K5903 Drug induced constipation: Secondary | ICD-10-CM | POA: Insufficient documentation

## 2019-01-04 DIAGNOSIS — I10 Essential (primary) hypertension: Secondary | ICD-10-CM | POA: Insufficient documentation

## 2019-01-04 DIAGNOSIS — Z8249 Family history of ischemic heart disease and other diseases of the circulatory system: Secondary | ICD-10-CM | POA: Diagnosis not present

## 2019-01-04 DIAGNOSIS — F17210 Nicotine dependence, cigarettes, uncomplicated: Secondary | ICD-10-CM | POA: Insufficient documentation

## 2019-01-04 DIAGNOSIS — T402X5A Adverse effect of other opioids, initial encounter: Secondary | ICD-10-CM

## 2019-01-04 DIAGNOSIS — E118 Type 2 diabetes mellitus with unspecified complications: Secondary | ICD-10-CM | POA: Diagnosis not present

## 2019-01-04 DIAGNOSIS — Z79899 Other long term (current) drug therapy: Secondary | ICD-10-CM | POA: Insufficient documentation

## 2019-01-04 MED ORDER — SENNOSIDES-DOCUSATE SODIUM 8.6-50 MG PO TABS: 1.0000 | ORAL_TABLET | Freq: Every day | ORAL | 1 refills | Status: AC

## 2019-01-04 MED ORDER — PRAVASTATIN SODIUM 20 MG PO TABS: 20.0000 mg | ORAL_TABLET | Freq: Every day | ORAL | 3 refills | Status: DC

## 2019-01-04 MED ORDER — METFORMIN HCL 500 MG PO TABS: 500.0000 mg | ORAL_TABLET | Freq: Two times a day (BID) | ORAL | 3 refills | Status: DC

## 2019-01-04 MED ORDER — LOSARTAN POTASSIUM 100 MG PO TABS: 100.0000 mg | ORAL_TABLET | Freq: Every day | ORAL | 0 refills | Status: DC

## 2019-01-04 MED ORDER — TRAZODONE HCL 100 MG PO TABS: 100.0000 mg | ORAL_TABLET | Freq: Every day | ORAL | 0 refills | Status: DC

## 2019-01-04 NOTE — Progress Notes (Signed)
Virtual Visit via Telephone Note Due to national recommendations of social distancing due to [place] 19, telehealth visit is felt to be most appropriate for this patient at this time.  I discussed the limitations, risks, security and privacy concerns of performing an evaluation and management service by telephone and the availability of in person appointments. I also discussed with the patient that there may be a patient responsible charge related to this service. The patient expressed understanding and agreed to proceed.    I connected with Phillip Paul on 01/04/19  at   9:50 AM EST  EDT by telephone and verified that I am speaking with the correct person using two identifiers.   Consent I discussed the limitations, risks, security and privacy concerns of performing an evaluation and management service by telephone and the availability of in person appointments. I also discussed with the patient that there may be a patient responsible charge related to this service. The patient expressed understanding and agreed to proceed.   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 Causing constipation. Has not completely weaned off methadone.  I had referred him to pain management however he will need to be weaned off methadone by the methadone clinic. He is aware of this and states he plans to speak with them on Wednesday the 23rd about weaning off. After he is completely weaned off we will re evaluate if he is a candidate for pain management.    DM TYPE 2 Fasting: 110 this morning. Post prandial: does not check. I have encouraged him to monitor both post prandial and fasting. Current medications include Levemir  20 units daily, Metformin 500 mg twice daily.  He is taking an ARB (losartan) and statin (pravastatin).  Eye exam is up-to-date. Lab Results  Component Value Date   HGBA1C 6.9 (A) 08/31/2018    Essential Hypertension Does not monitor blood pressure at home. Continues to smoke cigarettes daily. Denies chest pain, shortness of breath, palpitations, lightheadedness, dizziness, headaches or BLE edema. Current medications include: losartan 100 mg daily.  BP Readings from Last 3 Encounters:  09/24/18 (!) 162/86  08/31/18 138/80  05/29/18 127/86     Past Medical History:  Diagnosis Date  . Abnormal EKG   . Anxiety   . Diabetes mellitus without complication ([place])   . Dizzy   . Hypertension   . Kidney stones   . Racing heart beat     Past Surgical History:  Procedure Laterality Date  . CYSTOSCOPY     LEFT URETEROSCOPIC STONE MANIPULATION AND REMOVAL; PLACEMENT OF  LEFT DOUBLE-J URETERAL STENT  . CYSTOSCOPY W/ URETERAL STENT PLACEMENT     LEFT DOUBLE-J URETERAL STENT  . ORIF ANKLE FRACTURE Left 08/17/2012   Procedure: OPEN REDUCTION INTERNAL FIXATION (ORIF) ANKLE FRACTURE/Left;  Surgeon: Wylene Simmer, MD;  Location: [place];  Service: Orthopedics;  Laterality: Left;  . TIBIA IM NAIL INSERTION Left 08/17/2012   Procedure: INTRAMEDULLARY (IM) NAIL TIBIAL/Left;  Surgeon: Wylene Simmer, MD;  Location: [place];  Service: Orthopedics;  Laterality: Left;    Family History  Problem Relation Age of Onset  . Hypertension Father        lived to 9  . Other Brother  MVA  . Kidney disease Sister   . Other Brother        Stomach problems    Social History   Socioeconomic History  . Marital status: Significant Other    Spouse name: Not on file  . Number of children: 1  . Years of education: Not on file  . Highest education level: Bachelor's degree (e.g., BA, AB, BS)  Occupational History  . Occupation: retired  Tobacco Use  . Smoking status: Current Every Day Smoker    Packs/day: 0.25  .  Smokeless tobacco: Never Used  . Tobacco comment: 1 pk/week  Substance and Sexual Activity  . Alcohol use: Not Currently    Comment: Pt. stated he have not drank in 4 years.   . Drug use: No  . Sexual activity: Yes  Other Topics Concern  . Not on file  Social History Narrative   NO FAMILY HX TO REPORT FROM NEW RECORDS      Patient is right-handed. He lives with his girlfriend. He drinks 2-3 cups or tea a day. He does not exercise.   Social Determinants of Health   Financial Resource Strain:   . Difficulty of Paying Living Expenses: Not on file  Food Insecurity:   . Worried About Charity fundraiser in the Last Year: Not on file  . Ran Out of Food in the Last Year: Not on file  Transportation Needs:   . Lack of Transportation (Medical): Not on file  . Lack of Transportation (Non-Medical): Not on file  Physical Activity:   . Days of Exercise per Week: Not on file  . Minutes of Exercise per Session: Not on file  Stress:   . Feeling of Stress : Not on file  Social Connections:   . Frequency of Communication with Friends and Family: Not on file  . Frequency of Social Gatherings with Friends and Family: Not on file  . Attends Religious Services: Not on file  . Active Member of Clubs or Organizations: Not on file  . Attends Archivist Meetings: Not on file  . Marital Status: Not on file     Observations/Objective: Awake, alert and oriented x 3   Review of Systems  Constitutional: Negative for fever, malaise/fatigue and weight loss.  HENT: Negative.  Negative for nosebleeds.   Eyes: Negative.  Negative for blurred vision, double vision and photophobia.  Respiratory: Negative.  Negative for cough and shortness of breath.   Cardiovascular: Negative.  Negative for chest pain, palpitations and leg swelling.  Gastrointestinal: Positive for constipation. Negative for heartburn, nausea and vomiting.  Musculoskeletal: Negative.  Negative for myalgias.       CHRONIC PAIN   Neurological: Negative.  Negative for dizziness, focal weakness, seizures and headaches.  Psychiatric/Behavioral: Negative for suicidal ideas. The patient is nervous/anxious and has insomnia.     Assessment and Plan: Phillip Paul was seen today for follow-up.  Diagnoses and all orders for this visit:  Controlled type 2 diabetes mellitus with complication, with long-term current use of insulin (HCC) -     metFORMIN (GLUCOPHAGE) 500 MG tablet; Take 1 tablet (500 mg total) by mouth 2 (two) times daily with a meal. Continue blood sugar control as discussed in office today, low carbohydrate diet, and regular physical exercise as tolerated, 150 minutes per week (30 min each day, 5 days per week, or 50 min 3 days per week). Keep blood sugar logs with fasting goal of 90-130 mg/dl, post prandial (after you eat) less  than 180.  For Hypoglycemia: BS <60 and Hyperglycemia BS >400; contact the clinic ASAP. Annual eye exams and foot exams are recommended.   Essential hypertension -     losartan (COZAAR) 100 MG tablet; Take 1 tablet (100 mg total) by mouth daily. Continue all antihypertensives as prescribed.  Remember to bring in your blood pressure log with you for your follow up appointment.  DASH/Mediterranean Diets are healthier choices for HTN.    Insomnia, unspecified type -     traZODone (DESYREL) 100 MG tablet; Take 1 tablet (100 mg total) by mouth at bedtime. WELL CONTROLLED  Dyslipidemia, goal LDL below 70 -     pravastatin (PRAVACHOL) 20 MG tablet; Take 1 tablet (20 mg total) by mouth daily. INSTRUCTIONS: Work on a low fat, heart healthy diet and participate in regular aerobic exercise program by working out at least 150 minutes per week; 5 days a week-30 minutes per day. Avoid red meat/beef/steak,  fried foods. junk foods, sodas, sugary drinks, unhealthy snacking, alcohol and smoking.  Drink at least 80 oz of water per day and monitor your carbohydrate intake daily.    Constipation  due to opioid therapy -     senna-docusate (SENOKOT-S) 8.6-50 MG tablet; Take 1-2 tablets by mouth daily.     Follow Up Instructions Return in about 3 months (around 04/04/2019).     I discussed the assessment and treatment plan with the patient. The patient was provided an opportunity to ask questions and all were answered. The patient agreed with the plan and demonstrated an understanding of the instructions.   The patient was advised to call back or seek an in-person evaluation if the symptoms worsen or if the condition fails to improve as anticipated.  I provided 22 minutes of non-face-to-face time during this encounter including median intraservice time, reviewing previous notes, labs, imaging, medications and explaining diagnosis and management.  Gildardo Pounds, FNP-BC

## 2019-01-18 ENCOUNTER — Other Ambulatory Visit: Payer: Self-pay | Admitting: Neurology

## 2019-01-29 ENCOUNTER — Other Ambulatory Visit: Payer: Self-pay | Admitting: Nurse Practitioner

## 2019-01-29 DIAGNOSIS — I10 Essential (primary) hypertension: Secondary | ICD-10-CM
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
## 2019-02-01 ENCOUNTER — Other Ambulatory Visit: Payer: Self-pay

## 2019-02-01 ENCOUNTER — Telehealth (INDEPENDENT_AMBULATORY_CARE_PROVIDER_SITE_OTHER): Payer: Medicare Other | Admitting: Neurology

## 2019-02-01 ENCOUNTER — Encounter: Payer: Self-pay | Admitting: Neurology

## 2019-02-01 VITALS — Ht 71.0 in | Wt 209.0 lb

## 2019-02-01 DIAGNOSIS — G44219 Episodic tension-type headache, not intractable: Secondary | ICD-10-CM

## 2019-02-01 MED ORDER — NORTRIPTYLINE HCL 50 MG PO CAPS: 100.0000 mg | ORAL_CAPSULE | Freq: Every day | ORAL | 3 refills | Status: DC

## 2019-02-02 ENCOUNTER — Other Ambulatory Visit: Payer: Self-pay | Admitting: Family Medicine

## 2019-02-04 ENCOUNTER — Ambulatory Visit: Payer: Medicare Other | Admitting: Neurology

## 2019-03-14 ENCOUNTER — Ambulatory Visit: Payer: Medicare Other | Attending: Internal Medicine

## 2019-03-14 DIAGNOSIS — Z23 Encounter for immunization: Secondary | ICD-10-CM | POA: Insufficient documentation

## 2019-03-14 NOTE — Progress Notes (Signed)
   Covid-19 Vaccination Clinic  Name:  Phillip Paul    MRN: [id number] DOB: 1950-09-01  03/14/2019  [name] was observed post Covid-19 immunization for 15 minutes without incidence. He was provided with Vaccine Information Sheet and instruction to access the V-Safe system.   [name] was instructed to call 911 with any severe reactions post vaccine: Marland Kitchen Difficulty breathing  . Swelling of your face and throat  . A fast heartbeat  . A bad rash all over your body  . Dizziness and weakness    Immunizations Administered    Name Date Dose VIS Date Route   Pfizer COVID-19 Vaccine 03/14/2019  3:25 PM 0.3 mL 12/25/2018 Intramuscular   Manufacturer: [place]   Lot: [id number]   [place]: [id number]

## 2019-03-19 DIAGNOSIS — H524 Presbyopia: Secondary | ICD-10-CM | POA: Diagnosis not present

## 2019-04-13 ENCOUNTER — Ambulatory Visit: Payer: Medicare Other | Attending: Internal Medicine

## 2019-04-13 DIAGNOSIS — Z23 Encounter for immunization: Secondary | ICD-10-CM

## 2019-04-13 NOTE — Progress Notes (Signed)
   Covid-19 Vaccination Clinic  Name:  Jock Meeler    MRN: [id number] DOB: 02-19-1950  04/13/2019  Mr. Tobon was observed post Covid-19 immunization for 15 minutes without incident. He was provided with Vaccine Information Sheet and instruction to access the V-Safe system.   Mr. Hoerl was instructed to call 911 with any severe reactions post vaccine: Marland Kitchen Difficulty breathing  . Swelling of face and throat  . A fast heartbeat  . A bad rash all over body  . Dizziness and weakness   Immunizations Administered    Name Date Dose VIS Date Route   Pfizer COVID-19 Vaccine 04/13/2019  8:32 AM 0.3 mL 12/25/2018 Intramuscular   Manufacturer: [place]   Lot: [id number]   [place]: [id number]

## 2019-04-26 ENCOUNTER — Other Ambulatory Visit: Payer: Self-pay | Admitting: Nurse Practitioner

## 2019-04-26 ENCOUNTER — Other Ambulatory Visit: Payer: Self-pay | Admitting: Family Medicine

## 2019-04-26 DIAGNOSIS — I10 Essential (primary) hypertension: Secondary | ICD-10-CM

## 2019-04-28 ENCOUNTER — Ambulatory Visit: Payer: Medicare Other | Attending: Nurse Practitioner | Admitting: Nurse Practitioner

## 2019-04-28 ENCOUNTER — Other Ambulatory Visit: Payer: Self-pay

## 2019-04-28 ENCOUNTER — Encounter: Payer: Self-pay | Admitting: Nurse Practitioner

## 2019-04-28 DIAGNOSIS — I10 Essential (primary) hypertension: Secondary | ICD-10-CM

## 2019-04-28 DIAGNOSIS — E785 Hyperlipidemia, unspecified: Secondary | ICD-10-CM

## 2019-04-28 DIAGNOSIS — F17200 Nicotine dependence, unspecified, uncomplicated: Secondary | ICD-10-CM

## 2019-04-28 DIAGNOSIS — E118 Type 2 diabetes mellitus with unspecified complications: Secondary | ICD-10-CM

## 2019-04-28 DIAGNOSIS — Z794 Long term (current) use of insulin: Secondary | ICD-10-CM

## 2019-05-05 ENCOUNTER — Other Ambulatory Visit: Payer: Medicaid Other

## 2019-05-11 ENCOUNTER — Ambulatory Visit: Payer: Medicare Other | Attending: Nurse Practitioner | Admitting: Nurse Practitioner

## 2019-05-11 ENCOUNTER — Other Ambulatory Visit: Payer: Self-pay

## 2019-05-11 ENCOUNTER — Encounter: Payer: Self-pay | Admitting: Nurse Practitioner

## 2019-05-11 VITALS — BP 148/90 | HR 98 | Temp 97.7°F | Ht 69.0 in | Wt 209.0 lb

## 2019-05-11 DIAGNOSIS — F419 Anxiety disorder, unspecified: Secondary | ICD-10-CM | POA: Insufficient documentation

## 2019-05-11 DIAGNOSIS — I10 Essential (primary) hypertension: Secondary | ICD-10-CM | POA: Insufficient documentation

## 2019-05-11 DIAGNOSIS — E785 Hyperlipidemia, unspecified: Secondary | ICD-10-CM | POA: Diagnosis not present

## 2019-05-11 DIAGNOSIS — Z87442 Personal history of urinary calculi: Secondary | ICD-10-CM | POA: Insufficient documentation

## 2019-05-11 DIAGNOSIS — Z79899 Other long term (current) drug therapy: Secondary | ICD-10-CM | POA: Insufficient documentation

## 2019-05-11 DIAGNOSIS — Z8249 Family history of ischemic heart disease and other diseases of the circulatory system: Secondary | ICD-10-CM | POA: Diagnosis not present

## 2019-05-11 DIAGNOSIS — Z794 Long term (current) use of insulin: Secondary | ICD-10-CM

## 2019-05-11 DIAGNOSIS — D72818 Other decreased white blood cell count: Secondary | ICD-10-CM

## 2019-05-11 DIAGNOSIS — Z886 Allergy status to analgesic agent status: Secondary | ICD-10-CM | POA: Diagnosis not present

## 2019-05-11 DIAGNOSIS — E118 Type 2 diabetes mellitus with unspecified complications: Secondary | ICD-10-CM

## 2019-05-11 DIAGNOSIS — R109 Unspecified abdominal pain: Secondary | ICD-10-CM | POA: Diagnosis not present

## 2019-05-11 DIAGNOSIS — F17200 Nicotine dependence, unspecified, uncomplicated: Secondary | ICD-10-CM | POA: Diagnosis not present

## 2019-05-11 DIAGNOSIS — Z1159 Encounter for screening for other viral diseases: Secondary | ICD-10-CM | POA: Diagnosis not present

## 2019-05-11 LAB — POCT URINALYSIS DIP (CLINITEK)
Bilirubin, UA: NEGATIVE
Blood, UA: NEGATIVE
Glucose, UA: NEGATIVE mg/dL
Ketones, POC UA: NEGATIVE mg/dL
Leukocytes, UA: NEGATIVE
Nitrite, UA: NEGATIVE
POC PROTEIN,UA: NEGATIVE
Spec Grav, UA: 1.02 (ref 1.010–1.025)
Urobilinogen, UA: 0.2 E.U./dL
pH, UA: 7.5 (ref 5.0–8.0)

## 2019-05-11 LAB — POCT GLYCOSYLATED HEMOGLOBIN (HGB A1C): Hemoglobin A1C: 7.2 % — AB (ref 4.0–5.6)

## 2019-05-11 LAB — GLUCOSE, POCT (MANUAL RESULT ENTRY): POC Glucose: 148 mg/dl — AB (ref 70–99)

## 2019-05-11 MED ORDER — LOSARTAN POTASSIUM 100 MG PO TABS: 100.0000 mg | ORAL_TABLET | Freq: Every day | ORAL | 0 refills | Status: DC

## 2019-05-11 MED ORDER — METFORMIN HCL 500 MG PO TABS: 500.0000 mg | ORAL_TABLET | Freq: Two times a day (BID) | ORAL | 3 refills | Status: DC

## 2019-05-11 MED ORDER — AMLODIPINE BESYLATE 10 MG PO TABS: 10.0000 mg | ORAL_TABLET | Freq: Every day | ORAL | 1 refills | Status: DC

## 2019-05-11 NOTE — Progress Notes (Signed)
Assessment & Plan:  Phillip Paul was seen today for follow-up.  Diagnoses and all orders for this visit:  Controlled type 2 diabetes mellitus with complication, with long-term current use of insulin (HCC) -     Glucose (CBG) -     HgB A1c -     Microalbumin/Creatinine Ratio, Urine -     metFORMIN (GLUCOPHAGE) 500 MG tablet; Take 1 tablet (500 mg total) by mouth 2 (two) times daily with a meal. Continue blood sugar control as discussed in office today, low carbohydrate diet, and regular physical exercise as tolerated, 150 minutes per week (30 min each day, 5 days per week, or 50 min 3 days per week). Keep blood sugar logs with fasting goal of 90-130 mg/dl, post prandial (after you eat) less than 180.  For Hypoglycemia: BS <60 and Hyperglycemia BS >400; contact the clinic ASAP. Annual eye exams and foot exams are recommended.   Essential hypertension -     amLODipine (NORVASC) 10 MG tablet; Take 1 tablet (10 mg total) by mouth daily. -     losartan (COZAAR) 100 MG tablet; Take 1 tablet (100 mg total) by mouth daily. -     CMP14+EGFR Continue all antihypertensives as prescribed.  Remember to bring in your blood pressure log with you for your follow up appointment.  DASH/Mediterranean Diets are healthier choices for HTN.    Dyslipidemia, goal LDL below 70 -     Lipid panel INSTRUCTIONS: Work on a low fat, heart healthy diet and participate in regular aerobic exercise program by working out at least 150 minutes per week; 5 days a week-30 minutes per day. Avoid red meat/beef/steak,  fried foods. junk foods, sodas, sugary drinks, unhealthy snacking, alcohol and smoking.  Drink at least 80 oz of water per day and monitor your carbohydrate intake daily.    Need for hepatitis C screening test -     Hepatitis C Antibody  Tobacco dependence Phillip Paul was counseled on the dangers of tobacco use, and was advised to quit. Reviewed strategies to maximize success, including removing  cigarettes and smoking materials from environment, stress management and support of family/friends as well as pharmacological alternatives including: Wellbutrin, Chantix, Nicotine patch, Nicotine gum or lozenges. Smoking cessation support: smoking cessation hotline: 1-800-QUIT-NOW.  Smoking cessation classes are also available through [facility]. Call 7754165423 or visit our website at [email or web address]   A total of 3 minutes was spent on counseling for smoking cessation and Phillip Paul is not ready to quit.   Decreased monocytes -     CBC  Right flank pain -     POCT URINALYSIS DIP (CLINITEK)    Patient has been counseled on age-appropriate routine health concerns for screening and prevention. These are reviewed and up-to-date. Referrals have been placed accordingly. Immunizations are up-to-date or declined.    Subjective:   Chief Complaint  Patient presents with  . Follow-up    Pt. is here for hypertension and diabetes.    HPI Phillip Paul 69 y.o. male presents to office today for follow up. Has a past medical history of Abnormal EKG, Anxiety, Diabetes mellitus without complication ([place]), Dizzy, Hypertension, Kidney stones, and Racing heart beat.   States nortriptyline causes excessive drowsiness. Does not wish to continue taking. At this time will only take trazodone. He was prescribed elavil for headaches by his neurologist.    DM TYPE 2 Foot exam performed today. Well controlled. Post prandial home readings:  120-140s. UTD  on eye exam. Taking metformin 500 mg BID and levemir 20 units daily. On ARB and STATIN.  Lab Results  Component Value Date   HGBA1C 7.2 (A) 05/11/2019   Dyslipidemia Not at goal. Taking pravastatin 20 mg daily as prescribed.  Lab Results  Component Value Date   LDLCALC 82 05/11/2019    Essential Hypertension Slightly elevated. Taking amlodipine 10 mg and losartan 100 mg daily as prescribed. Denies chest  pain, shortness of breath, palpitations, lightheadedness, dizziness, headaches or BLE edema.  BP Readings from Last 3 Encounters:  05/11/19 (!) 148/90  09/24/18 (!) 162/86  08/31/18 138/80   Review of Systems  Constitutional: Negative for fever, malaise/fatigue and weight loss.  HENT: Negative.  Negative for nosebleeds.   Eyes: Negative.  Negative for blurred vision, double vision and photophobia.  Respiratory: Negative.  Negative for cough and shortness of breath.   Cardiovascular: Negative.  Negative for chest pain, palpitations and leg swelling.  Gastrointestinal: Negative.  Negative for heartburn, nausea and vomiting.  Genitourinary: Positive for flank pain (right).  Musculoskeletal: Negative for myalgias.  Neurological: Negative.  Negative for dizziness, focal weakness, seizures and headaches.  Psychiatric/Behavioral: Negative.  Negative for suicidal ideas.    Past Medical History:  Diagnosis Date  . Abnormal EKG   . Anxiety   . Diabetes mellitus without complication ([place])   . Dizzy   . Hypertension   . Kidney stones   . Racing heart beat     Past Surgical History:  Procedure Laterality Date  . CYSTOSCOPY     LEFT URETEROSCOPIC STONE MANIPULATION AND REMOVAL; PLACEMENT OF  LEFT DOUBLE-J URETERAL STENT  . CYSTOSCOPY W/ URETERAL STENT PLACEMENT     LEFT DOUBLE-J URETERAL STENT  . ORIF ANKLE FRACTURE Left 08/17/2012   Procedure: OPEN REDUCTION INTERNAL FIXATION (ORIF) ANKLE FRACTURE/Left;  Surgeon: Wylene Simmer, MD;  Location: [facility];  Service: Orthopedics;  Laterality: Left;  . TIBIA IM NAIL INSERTION Left 08/17/2012   Procedure: INTRAMEDULLARY (IM) NAIL TIBIAL/Left;  Surgeon: Wylene Simmer, MD;  Location: [facility];  Service: Orthopedics;  Laterality: Left;    Family History  Problem Relation Age of Onset  . Hypertension Father        lived to 106  . Other Brother        MVA  . Kidney disease Sister   . Other Brother        Stomach problems    Social History Reviewed with  no changes to be made today.   Outpatient Medications Prior to Visit  Medication Sig Dispense Refill  . Blood Glucose Monitoring Suppl (ONE TOUCH ULTRA 2) w/Device KIT Check blood sugar by fingerstick 2x a day. ICD10 E11.8 and E11.65 1 each 0  . cholecalciferol (VITAMIN D) 1000 units tablet Take 1,000 Units by mouth daily.    . fish oil-omega-3 fatty acids 1000 MG capsule Take 1 g by mouth daily.    . fluticasone (FLONASE) 50 MCG/ACT nasal spray PLACE 2 SPRAYS INTO BOTH NOSTRILS DAILY. 16 g 2  . glucose blood (ONE TOUCH ULTRA TEST) test strip Check blood sugar by fingerstick 2x a day. ICD10 E11.8 and E11.65 100 each 11  . Insulin Detemir (LEVEMIR FLEXTOUCH) 100 UNIT/ML Pen Inject 20 Units into the skin daily. Pt. Is taking 20 units of Levemir. 15 mL 1  . Insulin Pen Needle 31G X 5 MM MISC Use as instructed. 100 each 12  . nortriptyline (PAMELOR) 50 MG capsule Take 2 capsules (100 mg total) by mouth at  bedtime. 180 capsule 3  . pravastatin (PRAVACHOL) 20 MG tablet Take 1 tablet (20 mg total) by mouth daily. 90 tablet 3  . traZODone (DESYREL) 100 MG tablet Take 1 tablet (100 mg total) by mouth at bedtime. 90 tablet 0  . TRUEPLUS LANCETS 28G MISC Use as instructed 100 each 3  . amLODipine (NORVASC) 10 MG tablet Take 1 tablet by mouth once daily 30 tablet 0  . losartan (COZAAR) 100 MG tablet Take 1 tablet (100 mg total) by mouth daily. Must have office visit for refills 90 tablet 0  . metFORMIN (GLUCOPHAGE) 500 MG tablet Take 1 tablet (500 mg total) by mouth 2 (two) times daily with a meal. 180 tablet 3   No facility-administered medications prior to visit.    Allergies  Allergen Reactions  . Tylenol [Acetaminophen] Other (See Comments)    ELEVATED LFTs       Objective:    BP (!) 148/90 (BP Location: Right Arm, Patient Position: Sitting, Cuff Size: Normal)   Pulse 98   Temp 97.7 F (36.5 C) (Temporal)   Ht 5' 9"  (1.753 m)   Wt 209 lb (94.8 kg)   SpO2 94%   BMI 30.86 kg/m  Wt  Readings from Last 3 Encounters:  05/11/19 209 lb (94.8 kg)  02/01/19 209 lb (94.8 kg)  09/24/18 214 lb (97.1 kg)    Physical Exam Vitals and nursing note reviewed.  Constitutional:      Appearance: He is well-developed.  HENT:     Head: Normocephalic and atraumatic.  Cardiovascular:     Rate and Rhythm: Normal rate and regular rhythm.     Pulses:          Dorsalis pedis pulses are 2+ on the right side and 2+ on the left side.       Posterior tibial pulses are 2+ on the right side and 2+ on the left side.     Heart sounds: Normal heart sounds. No murmur. No friction rub. No gallop.   Pulmonary:     Effort: Pulmonary effort is normal. No tachypnea or respiratory distress.     Breath sounds: Normal breath sounds. No decreased breath sounds, wheezing, rhonchi or rales.  Chest:     Chest wall: No tenderness.  Abdominal:     General: Bowel sounds are normal.     Palpations: Abdomen is soft.     Tenderness: There is no abdominal tenderness. There is no right CVA tenderness or left CVA tenderness.  Musculoskeletal:        General: Normal range of motion.     Cervical back: Normal range of motion.  Feet:     Right foot:     Protective Sensation: 10 sites tested. 10 sites sensed.     Toenail Condition: Fungal disease present.    Left foot:     Protective Sensation: 10 sites tested. 10 sites sensed.     Toenail Condition: Fungal disease present. Skin:    General: Skin is warm and dry.  Neurological:     Mental Status: He is alert and oriented to person, place, and time.     Coordination: Coordination normal.  Psychiatric:        Behavior: Behavior normal. Behavior is cooperative.        Thought Content: Thought content normal.        Judgment: Judgment normal.          Patient has been counseled extensively about nutrition and exercise as well as the  importance of adherence with medications and regular follow-up. The patient was given clear instructions to go to ER or return  to medical center if symptoms don't improve, worsen or new problems develop. The patient verbalized understanding.   Follow-up: Return in about 3 months (around 08/10/2019).   Gildardo Pounds, FNP-BC [facility] and [facility], [place]

## 2019-05-12 LAB — CMP14+EGFR
ALT: 82 IU/L — ABNORMAL HIGH (ref 0–44)
AST: 46 IU/L — ABNORMAL HIGH (ref 0–40)
Albumin/Globulin Ratio: 1.4 (ref 1.2–2.2)
Albumin: 4.4 g/dL (ref 3.8–4.8)
Alkaline Phosphatase: 86 IU/L (ref 39–117)
BUN/Creatinine Ratio: 15 (ref 10–24)
BUN: 11 mg/dL (ref 8–27)
Bilirubin Total: 0.4 mg/dL (ref 0.0–1.2)
CO2: 25 mmol/L (ref 20–29)
Calcium: 9.5 mg/dL (ref 8.6–10.2)
Chloride: 103 mmol/L (ref 96–106)
Creatinine, Ser: 0.73 mg/dL — ABNORMAL LOW (ref 0.76–1.27)
GFR calc Af Amer: 110 mL/min/{1.73_m2} (ref >59)
GFR calc non Af Amer: 95 mL/min/{1.73_m2} (ref >59)
Globulin, Total: 3.2 g/dL (ref 1.5–4.5)
Glucose: 116 mg/dL — ABNORMAL HIGH (ref 65–99)
Potassium: 4.6 mmol/L (ref 3.5–5.2)
Sodium: 141 mmol/L (ref 134–144)
Total Protein: 7.6 g/dL (ref 6.0–8.5)

## 2019-05-12 LAB — CBC
Hematocrit: 45 % (ref 37.5–51.0)
Hemoglobin: 15.2 g/dL (ref 13.0–17.7)
MCH: 30 pg (ref 26.6–33.0)
MCHC: 33.8 g/dL (ref 31.5–35.7)
MCV: 89 fL (ref 79–97)
Platelets: 234 10*3/uL (ref 150–450)
RBC: 5.06 x10E6/uL (ref 4.14–5.80)
RDW: 13.2 % (ref 11.6–15.4)
WBC: 6 10*3/uL (ref 3.4–10.8)

## 2019-05-12 LAB — LIPID PANEL
Chol/HDL Ratio: 3.1 ratio (ref 0.0–5.0)
Cholesterol, Total: 143 mg/dL (ref 100–199)
HDL: 46 mg/dL (ref >39)
LDL Chol Calc (NIH): 82 mg/dL (ref 0–99)
Triglycerides: 75 mg/dL (ref 0–149)
VLDL Cholesterol Cal: 15 mg/dL (ref 5–40)

## 2019-05-12 LAB — MICROALBUMIN / CREATININE URINE RATIO
Creatinine, Urine: 51.5 mg/dL
Microalb/Creat Ratio: 67 mg/g creat — ABNORMAL HIGH (ref 0–29)
Microalbumin, Urine: 34.4 ug/mL

## 2019-05-12 LAB — HEPATITIS C ANTIBODY: Hep C Virus Ab: >11 s/co ratio — ABNORMAL HIGH (ref 0.0–0.9)

## 2019-05-13 ENCOUNTER — Encounter: Payer: Self-pay | Admitting: Nurse Practitioner

## 2019-05-16 ENCOUNTER — Other Ambulatory Visit: Payer: Self-pay | Admitting: Nurse Practitioner

## 2019-05-16 DIAGNOSIS — R768 Other specified abnormal immunological findings in serum: Secondary | ICD-10-CM

## 2019-05-25 ENCOUNTER — Ambulatory Visit: Payer: Medicare Other | Attending: Nurse Practitioner

## 2019-05-25 ENCOUNTER — Telehealth: Payer: Self-pay | Admitting: Nurse Practitioner

## 2019-05-25 ENCOUNTER — Other Ambulatory Visit: Payer: Self-pay

## 2019-05-25 DIAGNOSIS — R768 Other specified abnormal immunological findings in serum: Secondary | ICD-10-CM

## 2019-05-25 NOTE — Telephone Encounter (Signed)
Patient in and stated that he has a missed call from pcp cma. Please follow up at your earliest convenience.

## 2019-05-27 LAB — HEPATITIS C VRS RNA DETECT BY PCR-QUAL: HCV RNA NAA Qualitative: POSITIVE — AB

## 2019-05-29 ENCOUNTER — Other Ambulatory Visit: Payer: Self-pay | Admitting: Nurse Practitioner

## 2019-05-29 DIAGNOSIS — R768 Other specified abnormal immunological findings in serum: Secondary | ICD-10-CM

## 2019-06-03 ENCOUNTER — Encounter: Payer: Self-pay | Admitting: Podiatry

## 2019-06-03 ENCOUNTER — Ambulatory Visit: Payer: Medicare Other | Admitting: Podiatry

## 2019-06-03 ENCOUNTER — Ambulatory Visit (INDEPENDENT_AMBULATORY_CARE_PROVIDER_SITE_OTHER): Payer: Medicare Other | Admitting: Podiatry

## 2019-06-03 ENCOUNTER — Other Ambulatory Visit: Payer: Self-pay

## 2019-06-03 VITALS — BP 141/84 | HR 84 | Temp 98.1°F

## 2019-06-03 DIAGNOSIS — E119 Type 2 diabetes mellitus without complications: Secondary | ICD-10-CM

## 2019-06-03 DIAGNOSIS — B351 Tinea unguium: Secondary | ICD-10-CM | POA: Diagnosis not present

## 2019-06-03 NOTE — Patient Instructions (Addendum)
I have ordered a medication for you that will come from [facility] in [place]. They should be calling you to verify insurance and will mail the medication to you. If you live close by then you can go by their pharmacy to pick up the medication. Their phone number is [phone]. If you do not hear from them in the next few days, please give us a call at [phone].    Diabetes Mellitus and Foot Care Foot care is an important part of your health, especially when you have diabetes. Diabetes may cause you to have problems because of poor blood flow (circulation) to your feet and legs, which can cause your skin to:  Become thinner and drier.  Break more easily.  Heal more slowly.  Peel and crack. You may also have nerve damage (neuropathy) in your legs and feet, causing decreased feeling in them. This means that you may not notice minor injuries to your feet that could lead to more serious problems. Noticing and addressing any potential problems early is the best way to prevent future foot problems. How to care for your feet Foot hygiene  Wash your feet daily with warm water and mild soap. Do not use hot water. Then, pat your feet and the areas between your toes until they are completely dry. Do not soak your feet as this can dry your skin.  Trim your toenails straight across. Do not dig under them or around the cuticle. File the edges of your nails with an emery board or nail file.  Apply a moisturizing lotion or petroleum jelly to the skin on your feet and to dry, brittle toenails. Use lotion that does not contain alcohol and is unscented. Do not apply lotion between your toes. Shoes and socks  Wear clean socks or stockings every day. Make sure they are not too tight. Do not wear knee-high stockings since they may decrease blood flow to your legs.  Wear shoes that fit properly and have enough cushioning. Always look in your shoes before you put them on to be sure there are no  objects inside.  To break in new shoes, wear them for just a few hours a day. This prevents injuries on your feet. Wounds, scrapes, corns, and calluses  Check your feet daily for blisters, cuts, bruises, sores, and redness. If you cannot see the bottom of your feet, use a mirror or ask someone for help.  Do not cut corns or calluses or try to remove them with medicine.  If you find a minor scrape, cut, or break in the skin on your feet, keep it and the skin around it clean and dry. You may clean these areas with mild soap and water. Do not clean the area with peroxide, alcohol, or iodine.  If you have a wound, scrape, corn, or callus on your foot, look at it several times a day to make sure it is healing and not infected. Check for: ? Redness, swelling, or pain. ? Fluid or blood. ? Warmth. ? Pus or a bad smell. General instructions  Do not cross your legs. This may decrease blood flow to your feet.  Do not use heating pads or hot water bottles on your feet. They may burn your skin. If you have lost feeling in your feet or legs, you may not know this is happening until it is too late.  Protect your feet from hot and cold by wearing shoes, such as at the beach or on hot pavement.    Schedule a complete foot exam at least once a year (annually) or more often if you have foot problems. If you have foot problems, report any cuts, sores, or bruises to your health care provider immediately. Contact a health care provider if:  You have a medical condition that increases your risk of infection and you have any cuts, sores, or bruises on your feet.  You have an injury that is not healing.  You have redness on your legs or feet.  You feel burning or tingling in your legs or feet.  You have pain or cramps in your legs and feet.  Your legs or feet are numb.  Your feet always feel cold.  You have pain around a toenail. Get help right away if:  You have a wound, scrape, corn, or callus on  your foot and: ? You have pain, swelling, or redness that gets worse. ? You have fluid or blood coming from the wound, scrape, corn, or callus. ? Your wound, scrape, corn, or callus feels warm to the touch. ? You have pus or a bad smell coming from the wound, scrape, corn, or callus. ? You have a fever. ? You have a red line going up your leg. Summary  Check your feet every day for cuts, sores, red spots, swelling, and blisters.  Moisturize feet and legs daily.  Wear shoes that fit properly and have enough cushioning.  If you have foot problems, report any cuts, sores, or bruises to your health care provider immediately.  Schedule a complete foot exam at least once a year (annually) or more often if you have foot problems. This information is not intended to replace advice given to you by your health care provider. Make sure you discuss any questions you have with your health care provider. Document Revised: 09/23/2018 Document Reviewed: 02/02/2016  Onychomycosis/Fungal Toenails  WHAT IS IT? An infection that lies within the keratin of your nail plate that is caused by a fungus.  WHY ME? Fungal infections affect all ages, sexes, races, and creeds.  There may be many factors that predispose you to a fungal infection such as age, coexisting medical conditions such as diabetes, or an autoimmune disease; stress, medications, fatigue, genetics, etc.  Bottom line: fungus thrives in a warm, moist environment and your shoes offer such a location.  IS IT CONTAGIOUS? Theoretically, yes.  You do not want to share shoes, nail clippers or files with someone who has fungal toenails.  Walking around barefoot in the same room or sleeping in the same bed is unlikely to transfer the organism.  It is important to realize, however, that fungus can spread easily from one nail to the next on the same foot.  HOW DO WE TREAT THIS?  There are several ways to treat this condition.  Treatment may depend on many  factors such as age, medications, pregnancy, liver and kidney conditions, etc.  It is best to ask your doctor which options are available to you.  5. No treatment.   Unlike many other medical concerns, you can live with this condition.  However for many people this can be a painful condition and may lead to ingrown toenails or a bacterial infection.  It is recommended that you keep the nails cut short to help reduce the amount of fungal nail. 6. Topical treatment.  These range from herbal remedies to prescription strength nail lacquers.  About 40-50% effective, topicals require twice daily application for approximately 9 to 12 months or until  an entirely new nail has grown out.  The most effective topicals are medical grade medications available through physicians offices. 7. Oral antifungal medications.  With an 80-90% cure rate, the most common oral medication requires 3 to 4 months of therapy and stays in your system for a year as the new nail grows out.  Oral antifungal medications do require blood work to make sure it is a safe drug for you.  A liver function panel will be performed prior to starting the medication and after the first month of treatment.  It is important to have the blood work performed to avoid any harmful side effects.  In general, this medication safe but blood work is required. 8. Laser Therapy.  This treatment is performed by applying a specialized laser to the affected nail plate.  This therapy is noninvasive, fast, and non-painful.  It is not covered by insurance and is therefore, out of pocket.  The results have been very good with a 80-95% cure rate.  [facility] is the only practice in the area to offer this therapy. 9. Permanent Nail Avulsion.  Removing the entire nail so that a new nail will not grow back. [employer] Patient Education  [employer].

## 2019-06-05 NOTE — Progress Notes (Signed)
Subjective:   Patient ID: Phillip Paul, male   DOB: 68 y.o.   MRN: [id number]   HPI 68-year-old male presents the office today for concerns of the nails on his right foot becoming thicker discolored and causing occasional tenderness.  Denies any redness or drainage or any swelling.  He said no recent treatment.  He is diabetic and his last A1c was 7.2.  No history of ulceration.   Review of Systems  All other systems reviewed and are negative.  Past Medical History:  Diagnosis Date  . Abnormal EKG   . Anxiety   . Diabetes mellitus without complication (HCC)   . Dizzy   . Hypertension   . Kidney stones   . Racing heart beat     Past Surgical History:  Procedure Laterality Date  . CYSTOSCOPY     LEFT URETEROSCOPIC STONE MANIPULATION AND REMOVAL; PLACEMENT OF  LEFT DOUBLE-J URETERAL STENT  . CYSTOSCOPY W/ URETERAL STENT PLACEMENT     LEFT DOUBLE-J URETERAL STENT  . ORIF ANKLE FRACTURE Left 08/17/2012   Procedure: OPEN REDUCTION INTERNAL FIXATION (ORIF) ANKLE FRACTURE/Left;  Surgeon: John Hewitt, MD;  Location: [facility] OR;  Service: Orthopedics;  Laterality: Left;  . TIBIA IM NAIL INSERTION Left 08/17/2012   Procedure: INTRAMEDULLARY (IM) NAIL TIBIAL/Left;  Surgeon: John Hewitt, MD;  Location: [facility] OR;  Service: Orthopedics;  Laterality: Left;     Current Outpatient Medications:  .  NON FORMULARY, [facility]  Anti-fungal (nail)-#1, Disp: , Rfl:  .  amLODipine (NORVASC) 10 MG tablet, Take 1 tablet (10 mg total) by mouth daily., Disp: 90 tablet, Rfl: 1 .  Blood Glucose Monitoring Suppl (ONE TOUCH ULTRA 2) w/Device KIT, Check blood sugar by fingerstick 2x a day. ICD10 E11.8 and E11.65, Disp: 1 each, Rfl: 0 .  cholecalciferol (VITAMIN D) 1000 units tablet, Take 1,000 Units by mouth daily., Disp: , Rfl:  .  fish oil-omega-3 fatty acids 1000 MG capsule, Take 1 g by mouth daily., Disp: , Rfl:  .  fluticasone (FLONASE) 50 MCG/ACT nasal spray, PLACE 2 SPRAYS INTO BOTH NOSTRILS  DAILY., Disp: 16 g, Rfl: 2 .  glucose blood (ONE TOUCH ULTRA TEST) test strip, Check blood sugar by fingerstick 2x a day. ICD10 E11.8 and E11.65, Disp: 100 each, Rfl: 11 .  Insulin Detemir (LEVEMIR FLEXTOUCH) 100 UNIT/ML Pen, Inject 20 Units into the skin daily. Pt. Is taking 20 units of Levemir., Disp: 15 mL, Rfl: 1 .  Insulin Pen Needle 31G X 5 MM MISC, Use as instructed., Disp: 100 each, Rfl: 12 .  losartan (COZAAR) 100 MG tablet, Take 1 tablet (100 mg total) by mouth daily., Disp: 90 tablet, Rfl: 0 .  metFORMIN (GLUCOPHAGE) 500 MG tablet, Take 1 tablet (500 mg total) by mouth 2 (two) times daily with a meal., Disp: 180 tablet, Rfl: 3 .  nortriptyline (PAMELOR) 50 MG capsule, Take 2 capsules (100 mg total) by mouth at bedtime., Disp: 180 capsule, Rfl: 3 .  pravastatin (PRAVACHOL) 20 MG tablet, Take 1 tablet (20 mg total) by mouth daily., Disp: 90 tablet, Rfl: 3 .  traZODone (DESYREL) 100 MG tablet, Take 1 tablet (100 mg total) by mouth at bedtime., Disp: 90 tablet, Rfl: 0 .  TRUEPLUS LANCETS 28G MISC, Use as instructed, Disp: 100 each, Rfl: 3  Allergies  Allergen Reactions  . Tylenol [Acetaminophen] Other (See Comments)    ELEVATED LFTs         Objective:  Physical Exam  General: AAO x3, NAD    Dermatological: Nails on the right foot on hypertrophic, dystrophic with yellow-brown discoloration.  There is no tenderness today and there is no edema, erythema or any signs of infection.  There is no open lesions.  Vascular: Dorsalis Pedis artery and Posterior Tibial artery pedal pulses are 2/4 bilateral with immedate capillary fill time. There is no pain with calf compression, swelling, warmth, erythema.   Neruologic: Grossly intact via light touch bilateral.   Musculoskeletal: No gross boney pedal deformities bilateral. No pain, crepitus, or limitation noted with foot and ankle range of motion bilateral. Muscular strength 5/5 in all groups tested bilateral.  Gait: Unassisted, Nonantalgic.        Assessment:   Onychomycosis    Plan:  -Treatment options discussed including all alternatives, risks, and complications -Etiology of symptoms were discussed -After discussion regards to different treatment options we held off on oral medications.  Prescribed a compound cream today through [facility] for onychomycosis -Discussed daily foot inspection and proper shoe gear.  Recommend least yearly follow-up from a diabetic standpoint  Return if symptoms worsen or fail to improve.   R  DPM       

## 2019-06-21 ENCOUNTER — Telehealth: Payer: Self-pay

## 2019-06-21 NOTE — Telephone Encounter (Signed)
COVID-19 Pre-Screening Questions:06/21/19  Do you currently have a fever (>100 F), chills or unexplained body aches? NO  Are you currently experiencing new cough, shortness of breath, sore throat, runny nose? NO   Have you recently travelled outside the state of [place] in the last 14 days? NO    Have you been in contact with someone that is currently pending confirmation of Covid19 testing or has been confirmed to have the [place] virus? NO  **If the patient answers NO to ALL questions -  advise the patient to please call the clinic before coming to the office should any symptoms develop.

## 2019-06-22 ENCOUNTER — Telehealth: Payer: Self-pay | Admitting: Pharmacy Technician

## 2019-06-22 ENCOUNTER — Other Ambulatory Visit: Payer: Self-pay

## 2019-06-22 ENCOUNTER — Encounter: Payer: Self-pay | Admitting: Infectious Diseases

## 2019-06-22 ENCOUNTER — Ambulatory Visit (INDEPENDENT_AMBULATORY_CARE_PROVIDER_SITE_OTHER): Payer: Medicare Other | Admitting: Infectious Diseases

## 2019-06-22 VITALS — BP 138/89 | HR 97 | Temp 98.2°F | Wt 208.0 lb

## 2019-06-22 DIAGNOSIS — B182 Chronic viral hepatitis C: Secondary | ICD-10-CM

## 2019-06-22 NOTE — Patient Instructions (Addendum)
MyChart Log In: ASLZADEH Password: welcome    Nice to meet you today!    We need to get a little more information about your hepatitis c infection before we start your treatment. I anticipate that we can get you started in a few weeks after we submit approval to your insurance to ensure payment. We may need to place referral for an ultrasound and/or gastroenterology if your blood work indicates more damage to the liver than expected.     ABOUT HEPATITIS C VIRUS:   Chronic Hepatitis C is the most common blood-borne infection in the [place], affecting approximately 3 million people.   It is the leading cause of cirrhosis, liver cancer, and end stage liver disease requiring transplantation when this infection goes untreated for many years   The majority of people who are infected are unaware because there are not many early symptoms that are specific to this and often go undiagnosed until a specific blood test is drawn.    The hepatitis c virus is passed primarily through direct exposure of contaminated blood or body fluids. It is most efficiently transmitted through repeated exposure to infected blood.   Risk for sexual transmission is very low but is possible if there is high frequency of unprotected sexual activity with known hepatitis c partner or multiple partners of known status.   Over time, approximately 60-70% of people can develop some degree of liver disease. Cirrhosis occurs in 10-20% of those with chronic infection. 1-5% will get liver cancer, which has a very high rate of death.    Approximately 15-25% clear the infection without medication (usually in the first 6 months of becoming exposed to virus)   Newer medications provide over 95% cure rate when taken as prescribed    IN GENERAL ABOUT DIET  . Persons living with chronic hepatitis c infection should eat a diet to maintain a healthy weight and avoid nutritional deficiencies.   . Completely avoiding  alcohol is the best decision for your liver health. If unable to do so please limit alcohol to as little as possible to less than 1 standard drink a day - this is very irritating to your liver.  . Limit tylenol use to less than 2,000 mg daily (two extra strength tablets only twice a day)  . If you have cirrhosis of the liver please take no more than 1,000 mg tylenol a day  . Patients with cirrhosis should not have protein restriction; we recommend a protein intake of approximately 1.2-1.5 g/kg/day.   . For patients with cirrhosis and hepatic encephalopathy, the American Association for the Study of Liver Diseases (AASLD) recommended protein intake is 1.2-1.5 g/kg/day.  . If you experience ascites (fluid accumulation in the abdomen associated with severe liver damage / cirrhosis) please limit sodium intake to < 2000 mg a day    UNTIL YOU HAVE BEEN TREATED AND CURED:  . Use condoms with all sexual encounters or practice abstinence to avoid sexual transmission   . No sharing of razors, toothbrushes, nail clippers or anything that could potentially have blood on it.   . If you cut yourself please clean and cover any wounds or open sores to others do not come into contact with your blood.   . If blood spills onto item/surface please clean with 1:10 bleach solution and allow to dry, EVEN if it is dried blood.    GENERAL HELPFUL HINTS ON HCV THERAPY:  1. Stay well-hydrated.  2. Notify the [facility] of any  changes in your other over-the-counter/herbal or prescription medications.  3. If you miss a dose of your medication, take the missed dose as soon as you remember. Return to your regular time/dose schedule the next day.   4.  Do not stop taking your medications without first talking with your healthcare provider.  5.  You will see our pharmacist-specialist within the first 2 weeks of starting your medication to monitor for any possible side effects.  6.  You will have blood work once  during treatment 4 weeks after your first pill. Again soon after treatment is completed and one final lab 3 months after your last pill to ensure cure!   TIPS TO BE SUCCESSFUL WITH DAILY MEDICATION USE:  1. Set a reminder on your phone  2. Try filling out a pill box for the week - pick a day and put one pill for every day during the week so you know right away if you missed a pill.   3. Have a trusted family member ask you about your medications.   4. Smartphone app

## 2019-06-22 NOTE — Telephone Encounter (Addendum)
[facility] Patient Advocate Encounter    Findings of the benefits investigation:   Insurance: Pharmacist, [facility] and [facility]  Prior Authorization: will begin insurance process once medication is prescribed. There does not appear to be a medication preference but must be written brand.   Patient currently fills medication within our outpatient setting and already in the system.   Patient currently is a household size 2 adults, income $600/month with retirement and prefers to have his medication mailed to the home.   Bartholomew Crews, CPhT [facility]acy Patient [facility] Phone: [phone] Fax: [phone] 06/22/2019 8:36 AM
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
## 2019-06-23 DIAGNOSIS — Z8619 Personal history of other infectious and parasitic diseases: Secondary | ICD-10-CM | POA: Insufficient documentation

## 2019-06-23 DIAGNOSIS — B182 Chronic viral hepatitis C: Secondary | ICD-10-CM | POA: Insufficient documentation

## 2019-06-23 NOTE — Assessment & Plan Note (Signed)
New Patient with Chronic Hepatitis C genotype unknown, treatment naive. APRI 0.491 and FIB-4 1.48 both indicating statistically low predictive risk of advanced fibrosis. Given likely prolonged duration of infection will check abdominal ultrasound for [facility] screen prior to treatment and stage with elastography.   I discussed with the patient the lab findings that confirm chronic hepatitis C as well as the natural history and progression of disease including about 30% of people who develop cirrhosis of the liver if left untreated and once cirrhosis is established there is a 2-7% risk per year of liver cancer and liver failure.  I discussed the importance of treatment and benefits in reducing the risk, even if significant liver fibrosis exists. I also discussed risk for re-infection following treatment should he not continue to modify risk factors.    Patient counseled extensively on limiting acetaminophen to no more than 2 grams daily, avoidance of alcohol.  Transmission discussed with patient including sexual transmission, sharing razors and toothbrush.   Will need referral to gastroenterology if concern for cirrhosis or advanced fibrosis, but no indications on non-invasive calculations thusfar  Will prescribe appropriate medication based on genotype and coverage   Hepatitis A and B titers to be drawn today with appropriate vaccinations as needed   Pneumovax vaccine at upcoming visit if not previously given  Further work up to include liver staging through non-invasive serum analysis with APRI and FIB4 scores and Liver Fibrosis panel; U/S to follow if discordant or concerning results.  Will call [place] back once all results are in and counsel on medication over the phone. He will return 4 weeks after starting to meet with pharmacy team and check RNA at that time.

## 2019-06-28 ENCOUNTER — Other Ambulatory Visit: Payer: Self-pay

## 2019-06-28 ENCOUNTER — Ambulatory Visit (HOSPITAL_COMMUNITY)
Admission: RE | Admit: 2019-06-28 | Discharge: 2019-06-28 | Disposition: A | Discharge status: Home / Self Care | Payer: Medicare Other | Source: Ambulatory Visit | Attending: Infectious Diseases | Admitting: Infectious Diseases

## 2019-06-28 DIAGNOSIS — B182 Chronic viral hepatitis C: Secondary | ICD-10-CM | POA: Diagnosis not present

## 2019-07-01 NOTE — Progress Notes (Signed)
Genotype 1b, FIB-4 score 1.48 and ultrasound indicating no concern for severe fibrosis or cirrhosis. OK to proceed with Mavyret x 8 weeks or alternative based on insurance formulary.   I called the patient to discuss the results of labs and ultrasound and answered all questions to his satisfaction.

## 2019-07-03 ENCOUNTER — Other Ambulatory Visit: Payer: Self-pay | Admitting: Family Medicine

## 2019-07-06 LAB — LIVER FIBROSIS, FIBROTEST-ACTITEST
ALT: 84 U/L — ABNORMAL HIGH (ref 9–46)
Alpha-2-Macroglobulin: 467 mg/dL — ABNORMAL HIGH (ref 106–279)
Apolipoprotein A1: 150 mg/dL (ref 94–176)
Bilirubin: 0.5 mg/dL (ref 0.2–1.2)
Fibrosis Score: 0.8
GGT: 122 U/L — ABNORMAL HIGH (ref 3–70)
Haptoglobin: 161 mg/dL (ref 43–212)
Necroinflammat ACT Score: 0.68
Reference ID: 3438808

## 2019-07-06 LAB — HEPATITIS C RNA QUANTITATIVE
HCV Quantitative Log: 5.3 Log IU/mL — ABNORMAL HIGH
HCV RNA, PCR, QN: 201000 IU/mL — ABNORMAL HIGH

## 2019-07-06 LAB — HEPATITIS B SURFACE ANTIBODY,QUALITATIVE: Hep B S Ab: NONREACTIVE

## 2019-07-06 LAB — PROTIME-INR
INR: 1
Prothrombin Time: 10.9 s (ref 9.0–11.5)

## 2019-07-06 LAB — HEPATITIS B SURFACE ANTIGEN: Hepatitis B Surface Ag: NONREACTIVE

## 2019-07-06 LAB — HEPATITIS C GENOTYPE

## 2019-07-06 LAB — HEPATITIS B CORE ANTIBODY, TOTAL: Hep B Core Total Ab: NONREACTIVE

## 2019-07-06 LAB — HEPATITIS A ANTIBODY, TOTAL: Hepatitis A AB,Total: REACTIVE — AB

## 2019-07-07 NOTE — Progress Notes (Signed)
HepC for prior authorization processing.

## 2019-07-08 ENCOUNTER — Other Ambulatory Visit: Payer: Self-pay | Admitting: Pharmacist

## 2019-07-08 ENCOUNTER — Telehealth: Payer: Self-pay | Admitting: Pharmacy Technician

## 2019-07-08 ENCOUNTER — Other Ambulatory Visit: Payer: Self-pay | Admitting: Nurse Practitioner

## 2019-07-08 ENCOUNTER — Encounter: Payer: Self-pay | Admitting: Nurse Practitioner

## 2019-07-08 DIAGNOSIS — B182 Chronic viral hepatitis C: Secondary | ICD-10-CM

## 2019-07-08 MED ORDER — MAVYRET 100-40 MG PO TABS: 3.0000 | ORAL_TABLET | Freq: Every day | ORAL | 1 refills | Status: DC

## 2019-07-08 MED ORDER — PENICILLIN V POTASSIUM 500 MG PO TABS
500.0000 mg | ORAL_TABLET | Freq: Four times a day (QID) | ORAL | 0 refills | Status: AC
Start: 2019-07-08 — End: 2019-07-15

## 2019-07-08 NOTE — Progress Notes (Signed)
Hepatitis C Treatment

## 2019-07-08 NOTE — Telephone Encounter (Addendum)
[facility] Patient Advocate Encounter  Prior Authorization for Mavyret has been approved.    PA# [id number] Effective dates: 04/08/2019 through 09/01/2019  Patients co-pay is $4.   I tried to call to set up shipment, no answer and no voicemail.  [facility] will continue to follow.  Venida Jarvis. Nadara Mustard [place] Patient [facility] for Infectious Disease Phone: [phone] Fax:  [phone]

## 2019-07-09 ENCOUNTER — Encounter: Payer: Self-pay | Admitting: Pharmacy Technician

## 2019-07-12 ENCOUNTER — Telehealth: Payer: Self-pay | Admitting: Pharmacist

## 2019-07-12 NOTE — Telephone Encounter (Signed)
Patient is approved to receive Mavyret x 8 weeks for chronic Hepatitis C infection. Counseled patient to take all three tablets of Mavyret daily with food.  Counseled patient the need to take all three tablets together and to not separate them out during the day. Encouraged patient not to miss any doses and explained how their chance of cure could go down with each dose missed.  Counseled patient on what to do if dose is missed - if it is closer to the missed dose take immediately; if closer to next dose then skip dose and take the next dose at the usual time.   Counseled patient on common side effects such as headache, fatigue, and nausea and that these normally decrease with time. I reviewed patient medications and found no drug interactions. Discussed with patient that there are several drug interactions with Mavyret and instructed patient to call the clinic if he wishes to start a new medication during course of therapy. Also advised patient to call if he experiences any side effects. Patient will follow-up with me in the pharmacy clinic on 7/26.
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
## 2019-07-24 ENCOUNTER — Other Ambulatory Visit: Payer: Self-pay | Admitting: Family Medicine

## 2019-07-24 DIAGNOSIS — I10 Essential (primary) hypertension: Secondary | ICD-10-CM

## 2019-07-24 NOTE — Telephone Encounter (Signed)
Requested Prescriptions  Pending Prescriptions Disp Refills  . losartan (COZAAR) 100 MG tablet [Pharmacy Med Name: Losartan Potassium 100 MG Oral Tablet] 90 tablet 0    Sig: Take 1 tablet by mouth once daily     Cardiovascular:  Angiotensin Receptor Blockers Failed - 07/24/2019  5:05 PM      Failed - Cr in normal range and within 180 days    Creat  Date Value Ref Range Status  06/22/2013 0.75 0.50 - 1.35 mg/dL Final   Creatinine, Ser  Date Value Ref Range Status  05/11/2019 0.73 (L) 0.76 - 1.27 mg/dL Final         Passed - K in normal range and within 180 days    Potassium  Date Value Ref Range Status  05/11/2019 4.6 3.5 - 5.2 mmol/L Final         Passed - Patient is not pregnant      Passed - Last BP in normal range    BP Readings from Last 1 Encounters:  06/22/19 138/89         Passed - Valid encounter within last 6 months    Recent Outpatient Visits          2 months ago Controlled type 2 diabetes mellitus with complication, with long-term current use of insulin ([place])   [place], [place] W, NP   6 months ago Controlled type 2 diabetes mellitus with complication, with long-term current use of insulin ([place])   [place], [place] W, NP   10 months ago Diabetes mellitus type 2, uncontrolled, with complications [facility])   [place], Vernia Buff, NP   1 year ago Diabetes mellitus type 2, uncontrolled, with complications [facility])   [place], Vernia Buff, NP   1 year ago Diabetes mellitus type 2, uncontrolled, with complications [facility])   [place], Vernia Buff, NP      Future Appointments            In 2 weeks Gildardo Pounds, NP [place]

## 2019-08-09 ENCOUNTER — Other Ambulatory Visit: Payer: Self-pay

## 2019-08-09 ENCOUNTER — Ambulatory Visit (INDEPENDENT_AMBULATORY_CARE_PROVIDER_SITE_OTHER): Payer: Medicare Other | Admitting: Pharmacist

## 2019-08-09 ENCOUNTER — Other Ambulatory Visit: Payer: Self-pay | Admitting: Pharmacist

## 2019-08-09 DIAGNOSIS — B182 Chronic viral hepatitis C: Secondary | ICD-10-CM

## 2019-08-09 NOTE — Progress Notes (Signed)
HPI: Phillip Paul is a 69 y.o. male who presents to the [facility] for Hepatitis C follow-up.  Medication: Mavyret x 8 weeks  Start Date: 07/10/19  Hepatitis C Genotype: 1a  Fibrosis Score: F4  Hepatitis C RNA: 201,000 on 06/22/19  Patient Active Problem List   Diagnosis Date Noted  . Chronic viral hepatitis C ([place]) 06/23/2019  . Post-traumatic headache 05/05/2017  . Concussion 03/28/2017  . Whiplash 03/28/2017  . Hyperglycemia 03/28/2017  . Essential hypertension 09/01/2014  . Chronic pain syndrome 09/01/2014  . Depression (emotion) 06/22/2013  . Pain in joint, ankle and foot, left 06/22/2013  . Back pain 06/22/2013  . Denture stomatitis 06/22/2013  . Blurry vision 06/22/2013  . Left tibial fracture 08/17/2012  . Left fibular fracture 08/17/2012  . Acute bronchitis 08/17/2012  . Dizziness 01/24/2011  . Hyperlipidemia 07/27/2010  . Exertional dyspnea 06/06/2010  . Smoking 06/06/2010  . Tachycardia 06/06/2010    Patient's Medications  New Prescriptions   No medications on file  Previous Medications   AMLODIPINE (NORVASC) 10 MG TABLET    Take 1 tablet (10 mg total) by mouth daily.   BLOOD GLUCOSE MONITORING SUPPL (ONE TOUCH ULTRA 2) W/DEVICE KIT    Check blood sugar by fingerstick 2x a day. ICD10 E11.8 and E11.65   CHOLECALCIFEROL (VITAMIN D) 1000 UNITS TABLET    Take 1,000 Units by mouth daily.   FISH OIL-OMEGA-3 FATTY ACIDS 1000 MG CAPSULE    Take 1 g by mouth daily.   FLUTICASONE (FLONASE) 50 MCG/ACT NASAL SPRAY    PLACE 2 SPRAYS INTO BOTH NOSTRILS DAILY.   GLECAPREVIR-PIBRENTASVIR (MAVYRET) 100-40 MG TABS    Take 3 tablets by mouth daily with breakfast.   INSULIN PEN NEEDLE 31G X 5 MM MISC    Use as instructed.   LEVEMIR FLEXTOUCH 100 UNIT/ML FLEXPEN    INJECT 20 UNITS SUBCUTANEOUSLY DAILY   LOSARTAN (COZAAR) 100 MG TABLET    Take 1 tablet by mouth once daily   METFORMIN (GLUCOPHAGE) 500 MG TABLET    Take 1 tablet (500 mg total) by mouth 2 (two)  times daily with a meal.   NON FORMULARY    [facility]  Anti-fungal (nail)-#1   NORTRIPTYLINE (PAMELOR) 50 MG CAPSULE    Take 2 capsules (100 mg total) by mouth at bedtime.   ONETOUCH ULTRA TEST STRIP    USE TO CHECK BLOOD SUGAR TWICE DAILY   PRAVASTATIN (PRAVACHOL) 20 MG TABLET    Take 1 tablet (20 mg total) by mouth daily.   TRAZODONE (DESYREL) 100 MG TABLET    Take 1 tablet (100 mg total) by mouth at bedtime.   TRUEPLUS LANCETS 28G MISC    Use as instructed  Modified Medications   No medications on file  Discontinued Medications   No medications on file    Allergies: Allergies  Allergen Reactions  . Tylenol [Acetaminophen] Other (See Comments)    ELEVATED LFTs    Past Medical History: Past Medical History:  Diagnosis Date  . Abnormal EKG   . Anxiety   . Diabetes mellitus without complication ([place])   . Dizzy   . Hypertension   . Kidney stones   . Racing heart beat     Social History: Social History   Socioeconomic History  . Marital status: Significant Other    Spouse name: Not on file  . Number of children: 1  . Years of education: Not on file  . Highest education level: Bachelor's degree (  e.g., BA, AB, BS)  Occupational History  . Occupation: retired  Tobacco Use  . Smoking status: Current Every Day Smoker    Packs/day: 0.25  . Smokeless tobacco: Never Used  . Tobacco comment: 1 pk/week  Vaping Use  . Vaping Use: Never used  Substance and Sexual Activity  . Alcohol use: Not Currently    Comment: Pt. stated he have not drank in 4 years.   . Drug use: No  . Sexual activity: Yes  Other Topics Concern  . Not on file  Social History Narrative   NO FAMILY HX TO REPORT FROM NEW RECORDS      Patient is right-handed. He lives with his girlfriend. He drinks 2-3 cups or tea a day. He does not exercise.   Social Determinants of Health   Financial Resource Strain:   . Difficulty of Paying Living Expenses:   Food Insecurity:   . Worried About  Charity fundraiser in the Last Year:   . Arboriculturist in the Last Year:   Transportation Needs:   . Film/video editor (Medical):   Marland Kitchen Lack of Transportation (Non-Medical):   Physical Activity:   . Days of Exercise per Week:   . Minutes of Exercise per Session:   Stress:   . Feeling of Stress :   Social Connections:   . Frequency of Communication with Friends and Family:   . Frequency of Social Gatherings with Friends and Family:   . Attends Religious Services:   . Active Member of Clubs or Organizations:   . Attends Archivist Meetings:   Marland Kitchen Marital Status:     Labs: Hepatitis C Lab Results  Component Value Date   HCVGENOTYPE 1a 06/22/2019   HCVRNAPCRQN 201,000 (H) 06/22/2019   FIBROSTAGE F4 06/22/2019   Hepatitis B Lab Results  Component Value Date   HEPBSAB NON-REACTIVE 06/22/2019   HEPBSAG NON-REACTIVE 06/22/2019   HEPBCAB NON-REACTIVE 06/22/2019   Hepatitis A Lab Results  Component Value Date   HAV REACTIVE (A) 06/22/2019   HIV Lab Results  Component Value Date   HIV NONREACTIVE 06/29/2013   Lab Results  Component Value Date   CREATININE 0.73 (L) 05/11/2019   CREATININE 0.72 (L) 08/31/2018   CREATININE 0.76 02/13/2018   CREATININE 0.66 (L) 08/06/2017   CREATININE 0.72 03/28/2017   Lab Results  Component Value Date   AST 46 (H) 05/11/2019   AST 41 (H) 02/13/2018   AST 46 (H) 09/08/2017   ALT 84 (H) 06/22/2019   ALT 82 (H) 05/11/2019   ALT 79 (H) 02/13/2018   INR 1.0 06/22/2019   INR 1.02 08/17/2012   INR 1.0 07/05/2010    Assessment: Phillip Paul is here today for his 4-week Hepatitis C follow up. He is taking Mavyret for 8 weeks and started about 5 weeks ago. He is doing very well with no side effects or missed doses.  He takes all three tablets together with breakast every day. He is doing a great job. He has also lost 17 lbs in the last 3 months with diet and exercising. Congratulated him on this and for not missing any doses.   Encouraged continued compliance for the remainder of his 8 weeks of treatment.  I will check labs today and bring him back in 4 weeks for end of treatment labs, and then schedule him with [facility] [place] for his cure visit towards the end of the year.  Plan: - Continue Mavyret x 8 weeks - Hep C  RNA + CMET today - F/u for EOT labs 8/31 at 1030am - F/u with [facility] for cure visit 12/13 at [place]. , PharmD, BCIDP, AAHIVP, CPP Clinical Pharmacist Practitioner Infectious Diseases [place] for Infectious Disease 08/09/2019, 11:24 AM

## 2019-08-11 ENCOUNTER — Other Ambulatory Visit: Payer: Self-pay

## 2019-08-11 ENCOUNTER — Ambulatory Visit: Payer: Medicare Other | Attending: Nurse Practitioner | Admitting: Nurse Practitioner

## 2019-08-11 ENCOUNTER — Encounter: Payer: Self-pay | Admitting: Nurse Practitioner

## 2019-08-11 DIAGNOSIS — Z87442 Personal history of urinary calculi: Secondary | ICD-10-CM | POA: Diagnosis not present

## 2019-08-11 DIAGNOSIS — Z8249 Family history of ischemic heart disease and other diseases of the circulatory system: Secondary | ICD-10-CM | POA: Insufficient documentation

## 2019-08-11 DIAGNOSIS — R9431 Abnormal electrocardiogram [ECG] [EKG]: Secondary | ICD-10-CM | POA: Insufficient documentation

## 2019-08-11 DIAGNOSIS — F419 Anxiety disorder, unspecified: Secondary | ICD-10-CM | POA: Insufficient documentation

## 2019-08-11 DIAGNOSIS — I10 Essential (primary) hypertension: Secondary | ICD-10-CM | POA: Insufficient documentation

## 2019-08-11 DIAGNOSIS — F17211 Nicotine dependence, cigarettes, in remission: Secondary | ICD-10-CM | POA: Diagnosis not present

## 2019-08-11 DIAGNOSIS — R002 Palpitations: Secondary | ICD-10-CM | POA: Diagnosis not present

## 2019-08-11 DIAGNOSIS — Z794 Long term (current) use of insulin: Secondary | ICD-10-CM | POA: Insufficient documentation

## 2019-08-11 DIAGNOSIS — R42 Dizziness and giddiness: Secondary | ICD-10-CM | POA: Insufficient documentation

## 2019-08-11 DIAGNOSIS — Z79899 Other long term (current) drug therapy: Secondary | ICD-10-CM | POA: Diagnosis not present

## 2019-08-11 DIAGNOSIS — E118 Type 2 diabetes mellitus with unspecified complications: Secondary | ICD-10-CM | POA: Diagnosis not present

## 2019-08-11 DIAGNOSIS — E785 Hyperlipidemia, unspecified: Secondary | ICD-10-CM | POA: Diagnosis not present

## 2019-08-11 MED ORDER — METFORMIN HCL 500 MG PO TABS: 500.0000 mg | ORAL_TABLET | Freq: Two times a day (BID) | ORAL | 3 refills | Status: DC

## 2019-08-11 NOTE — Progress Notes (Signed)
Virtual Visit via Telephone Note Due to national recommendations of social distancing due to [place] 19, telehealth visit is felt to be most appropriate for this patient at this time.  I discussed the limitations, risks, security and privacy concerns of performing an evaluation and management service by telephone and the availability of in person appointments. I also discussed with the patient that there may be a patient responsible charge related to this service. The patient expressed understanding and agreed to proceed.    I connected with Phillip Paul Lines on 08/11/19  at   9:30 AM EDT  EDT by telephone and verified that I am speaking with the correct person using two identifiers.   Consent I discussed the limitations, risks, security and privacy concerns of performing an evaluation and management service by telephone and the availability of in person appointments. I also discussed with the patient that there may be a patient responsible charge related to this service. The patient expressed understanding and agreed to proceed.   Location of Patient: Private Residence   Location of Provider: [place] and [employer] Office    Persons participating in Telemedicine visit: Geryl Rankins FNP-BC [place]    History of Present Illness: Telemedicine visit for: Follow up  has a past medical history of Abnormal EKG, Anxiety, Diabetes mellitus without complication ([place]), Dizzy, Hypertension, Kidney stones, and Racing heart beat.  He stopped smoking several days ago. Has experienced Increased shortness of breath with activity since he stopped. There is not shortness of breath and rest and symptoms are mild.  No other concerns today.   Essential Hypertension Well controlled. Taking amlodipine 10 mg daily, losartan 100 mg daily as prescribed. Denies chest pain, palpitations, lightheadedness, dizziness, headaches or BLE edema.  BP Readings from Last 3  Encounters:  06/22/19 138/89  06/03/19 (!) 141/84  05/11/19 (!) 148/90    DM TYPE 2 Well controlled. He takes metformin 500 mg BID and levemir 20 units daily. Denies any symptoms of hypo or hyperglycemia.  Taking ARB and statin. LDL not at goal with pravastatin 20 mg daily.  Lab Results  Component Value Date   HGBA1C 7.2 (A) 05/11/2019   Lab Results  Component Value Date   LDLCALC 82 05/11/2019   Past Medical History:  Diagnosis Date  . Abnormal EKG   . Anxiety   . Diabetes mellitus without complication ([place])   . Dizzy   . Hypertension   . Kidney stones   . Racing heart beat     Past Surgical History:  Procedure Laterality Date  . CYSTOSCOPY     LEFT URETEROSCOPIC STONE MANIPULATION AND REMOVAL; PLACEMENT OF  LEFT DOUBLE-J URETERAL STENT  . CYSTOSCOPY W/ URETERAL STENT PLACEMENT     LEFT DOUBLE-J URETERAL STENT  . ORIF ANKLE FRACTURE Left 08/17/2012   Procedure: OPEN REDUCTION INTERNAL FIXATION (ORIF) ANKLE FRACTURE/Left;  Surgeon: Wylene Simmer, MD;  Location: [place];  Service: Orthopedics;  Laterality: Left;  . TIBIA IM NAIL INSERTION Left 08/17/2012   Procedure: INTRAMEDULLARY (IM) NAIL TIBIAL/Left;  Surgeon: Wylene Simmer, MD;  Location: [place];  Service: Orthopedics;  Laterality: Left;    Family History  Problem Relation Age of Onset  . Hypertension Father        lived to 56  . Other Brother        MVA  . Kidney disease Sister   . Other Brother        Stomach problems    Social History  Socioeconomic History  . Marital status: Significant Other    Spouse name: Not on file  . Number of children: 1  . Years of education: Not on file  . Highest education level: Bachelor's degree (e.g., BA, AB, BS)  Occupational History  . Occupation: retired  Tobacco Use  . Smoking status: Current Every Day Smoker    Packs/day: 0.25  . Smokeless tobacco: Never Used  . Tobacco comment: 1 pk/week  Vaping Use  . Vaping Use: Never used  Substance and Sexual Activity  . Alcohol  use: Not Currently    Comment: Pt. stated he have not drank in 4 years.   . Drug use: No  . Sexual activity: Yes  Other Topics Concern  . Not on file  Social History Narrative   NO FAMILY HX TO REPORT FROM NEW RECORDS      Patient is right-handed. He lives with his girlfriend. He drinks 2-3 cups or tea a day. He does not exercise.   Social Determinants of Health   Financial Resource Strain:   . Difficulty of Paying Living Expenses:   Food Insecurity:   . Worried About Charity fundraiser in the Last Year:   . Arboriculturist in the Last Year:   Transportation Needs:   . Film/video editor (Medical):   Marland Kitchen Lack of Transportation (Non-Medical):   Physical Activity:   . Days of Exercise per Week:   . Minutes of Exercise per Session:   Stress:   . Feeling of Stress :   Social Connections:   . Frequency of Communication with Friends and Family:   . Frequency of Social Gatherings with Friends and Family:   . Attends Religious Services:   . Active Member of Clubs or Organizations:   . Attends Archivist Meetings:   Marland Kitchen Marital Status:      Observations/Objective: Awake, alert and oriented x 3   Review of Systems  Constitutional: Negative for fever, malaise/fatigue and weight loss.  HENT: Negative.  Negative for nosebleeds.   Eyes: Negative.  Negative for blurred vision, double vision and photophobia.  Respiratory: Negative.  Negative for cough and shortness of breath.   Cardiovascular: Negative.  Negative for chest pain, palpitations and leg swelling.  Gastrointestinal: Negative.  Negative for heartburn, nausea and vomiting.  Musculoskeletal: Negative.  Negative for myalgias.  Neurological: Negative.  Negative for dizziness, focal weakness, seizures and headaches.  Psychiatric/Behavioral: Negative.  Negative for suicidal ideas.    Assessment and Plan: Phillip Paul was seen today for follow-up.  Diagnoses and all orders for this visit:  Essential  hypertension Continue all antihypertensives as prescribed.  Remember to bring in your blood pressure log with you for your follow up appointment.  DASH/Mediterranean Diets are healthier choices for HTN.    Controlled type 2 diabetes mellitus with complication, with long-term current use of insulin (HCC) -     metFORMIN (GLUCOPHAGE) 500 MG tablet; Take 1 tablet (500 mg total) by mouth 2 (two) times daily with a meal. Continue blood sugar control as discussed in office today, low carbohydrate diet, and regular physical exercise as tolerated, 150 minutes per week (30 min each day, 5 days per week, or 50 min 3 days per week). Keep blood sugar logs with fasting goal of 90-130 mg/dl, post prandial (after you eat) less than 180.  For Hypoglycemia: BS <60 and Hyperglycemia BS >400; contact the clinic ASAP. Annual eye exams and foot exams are recommended.   Dyslipidemia, goal  LDL below 70 INSTRUCTIONS: Work on a low fat, heart healthy diet and participate in regular aerobic exercise program by working out at least 150 minutes per week; 5 days a week-30 minutes per day. Avoid red meat/beef/steak,  fried foods. junk foods, sodas, sugary drinks, unhealthy snacking, alcohol and smoking.  Drink at least 80 oz of water per day and monitor your carbohydrate intake daily.     Follow Up Instructions Return in about 3 months (around 11/11/2019).     I discussed the assessment and treatment plan with the patient. The patient was provided an opportunity to ask questions and all were answered. The patient agreed with the plan and demonstrated an understanding of the instructions.   The patient was advised to call back or seek an in-person evaluation if the symptoms worsen or if the condition fails to improve as anticipated.  I provided 17 minutes of non-face-to-face time during this encounter including median intraservice time, reviewing previous notes, labs, imaging, medications and explaining diagnosis and  management.  Gildardo Pounds, FNP-BC

## 2019-08-12 ENCOUNTER — Encounter: Payer: Self-pay | Admitting: Nurse Practitioner

## 2019-08-12 ENCOUNTER — Other Ambulatory Visit: Payer: Self-pay | Admitting: Nurse Practitioner

## 2019-08-12 DIAGNOSIS — G4700 Insomnia, unspecified: Secondary | ICD-10-CM

## 2019-08-13 LAB — COMPREHENSIVE METABOLIC PANEL
AG Ratio: 1.2 (calc) (ref 1.0–2.5)
ALT: 30 U/L (ref 9–46)
AST: 25 U/L (ref 10–35)
Albumin: 4.1 g/dL (ref 3.6–5.1)
Alkaline phosphatase (APISO): 75 U/L (ref 35–144)
BUN: 18 mg/dL (ref 7–25)
CO2: 24 mmol/L (ref 20–32)
Calcium: 9.5 mg/dL (ref 8.6–10.3)
Chloride: 103 mmol/L (ref 98–110)
Creat: 0.77 mg/dL (ref 0.70–1.25)
Globulin: 3.3 g/dL (calc) (ref 1.9–3.7)
Glucose, Bld: 141 mg/dL — ABNORMAL HIGH (ref 65–99)
Potassium: 4.3 mmol/L (ref 3.5–5.3)
Sodium: 138 mmol/L (ref 135–146)
Total Bilirubin: 0.6 mg/dL (ref 0.2–1.2)
Total Protein: 7.4 g/dL (ref 6.1–8.1)

## 2019-08-13 LAB — HEPATITIS C RNA QUANTITATIVE
HCV Quantitative Log: <1.18 Log IU/mL
HCV RNA, PCR, QN: <15 IU/mL

## 2019-08-17 ENCOUNTER — Other Ambulatory Visit: Payer: Self-pay | Admitting: Nurse Practitioner

## 2019-08-17 ENCOUNTER — Other Ambulatory Visit: Payer: Self-pay

## 2019-08-17 ENCOUNTER — Ambulatory Visit: Payer: Medicare Other | Attending: Nurse Practitioner

## 2019-08-17 DIAGNOSIS — Z794 Long term (current) use of insulin: Secondary | ICD-10-CM | POA: Diagnosis not present

## 2019-08-17 DIAGNOSIS — E118 Type 2 diabetes mellitus with unspecified complications: Secondary | ICD-10-CM

## 2019-08-18 LAB — HEMOGLOBIN A1C
Est. average glucose Bld gHb Est-mCnc: 148 mg/dL
Hgb A1c MFr Bld: 6.8 % — ABNORMAL HIGH (ref 4.8–5.6)

## 2019-08-19 ENCOUNTER — Other Ambulatory Visit: Payer: Self-pay | Admitting: Nurse Practitioner

## 2019-08-19 ENCOUNTER — Encounter: Payer: Self-pay | Admitting: Nurse Practitioner

## 2019-08-19 MED ORDER — ONETOUCH ULTRA VI STRP: ORAL_STRIP | 3 refills | Status: DC

## 2019-09-14 ENCOUNTER — Other Ambulatory Visit: Payer: Medicare Other

## 2019-09-14 ENCOUNTER — Other Ambulatory Visit: Payer: Self-pay

## 2019-09-14 DIAGNOSIS — B182 Chronic viral hepatitis C: Secondary | ICD-10-CM

## 2019-09-16 LAB — HEPATITIS C RNA QUANTITATIVE
HCV RNA, PCR, QN (Log): <1.18 log IU/mL
HCV RNA, PCR, QN: <15 IU/mL

## 2019-09-17 ENCOUNTER — Other Ambulatory Visit: Payer: Self-pay | Admitting: Family Medicine

## 2019-09-17 NOTE — Telephone Encounter (Signed)
Requested Prescriptions  Pending Prescriptions Disp Refills   LEVEMIR FLEXTOUCH 100 UNIT/ML FlexPen [Pharmacy Med Name: Levemir FlexTouch 100 UNIT/ML Subcutaneous Solution Pen-injector] 15 mL 0    Sig: INJECT 20 UNITS SUBCUTANEOUSLY ONCE DAILY     Endocrinology:  Diabetes - Insulins Passed - 09/17/2019  9:02 PM      Passed - HBA1C is between 0 and 7.9 and within 180 days    HbA1c, POC (controlled diabetic range)  Date Value Ref Range Status  02/13/2018 7.2 (A) 0.0 - 7.0 % Final   Hgb A1c MFr Bld  Date Value Ref Range Status  08/17/2019 6.8 (H) 4.8 - 5.6 % Final    Comment:             Prediabetes: 5.7 - 6.4          Diabetes: >6.4          Glycemic control for adults with diabetes: <7.0          Passed - Valid encounter within last 6 months    Recent Outpatient Visits          1 month ago Essential hypertension   [place], [place] W, NP   4 months ago Controlled type 2 diabetes mellitus with complication, with long-term current use of insulin ([place])   [place], [place] W, NP   8 months ago Controlled type 2 diabetes mellitus with complication, with long-term current use of insulin [facility])   [place], [place] W, NP   1 year ago Diabetes mellitus type 2, uncontrolled, with complications [facility])   [place], Vernia Buff, NP   1 year ago Diabetes mellitus type 2, uncontrolled, with complications [facility]   [place] Gildardo Pounds, NP      Future Appointments            In 2 months Gildardo Pounds, NP [place]   In 3 months Doren Custard, Melton Krebs, NP [facility]

## 2019-10-24 ENCOUNTER — Other Ambulatory Visit: Payer: Self-pay | Admitting: Internal Medicine

## 2019-10-24 DIAGNOSIS — I10 Essential (primary) hypertension: Secondary | ICD-10-CM

## 2019-10-24 NOTE — Telephone Encounter (Signed)
Requested Prescriptions  Pending Prescriptions Disp Refills  . losartan (COZAAR) 100 MG tablet [Pharmacy Med Name: Losartan Potassium 100 MG Oral Tablet] 90 tablet 0    Sig: Take 1 tablet by mouth once daily     Cardiovascular:  Angiotensin Receptor Blockers Passed - 10/24/2019  6:32 PM      Passed - Cr in normal range and within 180 days    Creat  Date Value Ref Range Status  08/09/2019 0.77 0.70 - 1.25 mg/dL Final    Comment:    For patients >56 years of age, the reference limit for Creatinine is approximately 13% higher for people identified as African-American. .          Passed - K in normal range and within 180 days    Potassium  Date Value Ref Range Status  08/09/2019 4.3 3.5 - 5.3 mmol/L Final         Passed - Patient is not pregnant      Passed - Last BP in normal range    BP Readings from Last 1 Encounters:  06/22/19 138/89         Passed - Valid encounter within last 6 months    Recent Outpatient Visits          2 months ago Essential hypertension   [place], [place] W, NP   5 months ago Controlled type 2 diabetes mellitus with complication, with long-term current use of insulin [facility])   [place], [place] W, NP   9 months ago Controlled type 2 diabetes mellitus with complication, with long-term current use of insulin [facility])   [place], [place] W, NP   1 year ago Diabetes mellitus type 2, uncontrolled, with complications [facility])   [place], Vernia Buff, NP   1 year ago Diabetes mellitus type 2, uncontrolled, with complications [facility])   [place], Vernia Buff, NP      Future Appointments            In 3 weeks Gildardo Pounds, NP [place]   In 2 months Doren Custard, Melton Krebs, NP [facility]

## 2019-11-16 ENCOUNTER — Ambulatory Visit: Payer: Medicare Other | Attending: Nurse Practitioner | Admitting: Nurse Practitioner

## 2019-11-16 ENCOUNTER — Encounter: Payer: Self-pay | Admitting: Nurse Practitioner

## 2019-11-16 ENCOUNTER — Other Ambulatory Visit: Payer: Self-pay

## 2019-11-16 VITALS — BP 146/80 | HR 94 | Temp 97.7°F | Ht 69.0 in | Wt 200.2 lb

## 2019-11-16 DIAGNOSIS — Z886 Allergy status to analgesic agent status: Secondary | ICD-10-CM | POA: Insufficient documentation

## 2019-11-16 DIAGNOSIS — I10 Essential (primary) hypertension: Secondary | ICD-10-CM

## 2019-11-16 DIAGNOSIS — Z87442 Personal history of urinary calculi: Secondary | ICD-10-CM | POA: Insufficient documentation

## 2019-11-16 DIAGNOSIS — Z960 Presence of urogenital implants: Secondary | ICD-10-CM | POA: Insufficient documentation

## 2019-11-16 DIAGNOSIS — R9431 Abnormal electrocardiogram [ECG] [EKG]: Secondary | ICD-10-CM | POA: Diagnosis not present

## 2019-11-16 DIAGNOSIS — Z7984 Long term (current) use of oral hypoglycemic drugs: Secondary | ICD-10-CM | POA: Diagnosis not present

## 2019-11-16 DIAGNOSIS — E119 Type 2 diabetes mellitus without complications: Secondary | ICD-10-CM | POA: Insufficient documentation

## 2019-11-16 DIAGNOSIS — R42 Dizziness and giddiness: Secondary | ICD-10-CM | POA: Insufficient documentation

## 2019-11-16 DIAGNOSIS — Z794 Long term (current) use of insulin: Secondary | ICD-10-CM | POA: Insufficient documentation

## 2019-11-16 DIAGNOSIS — F419 Anxiety disorder, unspecified: Secondary | ICD-10-CM | POA: Diagnosis not present

## 2019-11-16 DIAGNOSIS — E118 Type 2 diabetes mellitus with unspecified complications: Secondary | ICD-10-CM

## 2019-11-16 DIAGNOSIS — Z8249 Family history of ischemic heart disease and other diseases of the circulatory system: Secondary | ICD-10-CM | POA: Diagnosis not present

## 2019-11-16 DIAGNOSIS — Z79899 Other long term (current) drug therapy: Secondary | ICD-10-CM | POA: Diagnosis not present

## 2019-11-16 DIAGNOSIS — N400 Enlarged prostate without lower urinary tract symptoms: Secondary | ICD-10-CM | POA: Insufficient documentation

## 2019-11-16 LAB — POCT GLYCOSYLATED HEMOGLOBIN (HGB A1C): Hemoglobin A1C: 6.5 % — AB (ref 4.0–5.6)

## 2019-11-16 LAB — GLUCOSE, POCT (MANUAL RESULT ENTRY): POC Glucose: 99 mg/dl (ref 70–99)

## 2019-11-16 MED ORDER — LEVEMIR FLEXTOUCH 100 UNIT/ML ~~LOC~~ SOPN: 10.0000 [IU] | PEN_INJECTOR | Freq: Every day | SUBCUTANEOUS | 6 refills | Status: DC

## 2019-11-16 MED ORDER — AMLODIPINE BESYLATE 10 MG PO TABS: 10.0000 mg | ORAL_TABLET | Freq: Every day | ORAL | 1 refills | Status: DC

## 2019-11-16 NOTE — Progress Notes (Signed)
Assessment & Plan:  Phillip Paul was seen today for follow-up.  Diagnoses and all orders for this visit:  Controlled type 2 diabetes mellitus with complication, with long-term current use of insulin (HCC) -     Glucose (CBG) -     HgB A1c -     Basic metabolic panel -     insulin detemir (LEVEMIR FLEXTOUCH) 100 UNIT/ML FlexPen; Inject 10 Units into the skin at bedtime. Continue blood sugar control as discussed in office today, low carbohydrate diet, and regular physical exercise as tolerated, 150 minutes per week (30 min each day, 5 days per week, or 50 min 3 days per week). Keep blood sugar logs with fasting goal of 90-130 mg/dl, post prandial (after you eat) less than 180.  For Hypoglycemia: BS <60 and Hyperglycemia BS >400; contact the clinic ASAP. Annual eye exams and foot exams are recommended.   BPH without urinary obstruction -     PSA  Essential hypertension -     amLODipine (NORVASC) 10 MG tablet; Take 1 tablet (10 mg total) by mouth daily. -     Cancel: US Carotid Duplex Bilateral; Future -     VAS US CAROTID; Future Continue all antihypertensives as prescribed.  Remember to bring in your blood pressure log with you for your follow up appointment.  DASH/Mediterranean Diets are healthier choices for HTN.    Dizziness -     VAS US CAROTID; Future    Patient has been counseled on age-appropriate routine health concerns for screening and prevention. These are reviewed and up-to-date. Referrals have been placed accordingly. Immunizations are up-to-date or declined.    Subjective:   Chief Complaint  Patient presents with   Follow-up    Pt. is here for diabetes and hypertension follow up.    HPI Phillip Paul 69 y.o. male presents to office today for follow up.  has a past medical history of Abnormal EKG, Anxiety, Diabetes mellitus without complication ([place]), Dizzy, Hypertension, Kidney stones, and Racing heart beat.   DM TYPE 2 Well controlled. Will  decrease Levemir from 20 units to 10 units. He will continue on metformin 500 mg BID. Denies any symptoms of hypo or hyperglycemia. LDL not at goal with pravastatin 20 mg daily and omega  fatty acid 1 gm daily.  He notes average blood glucose readings 100-130s. He has seen a few 200s which was expected due to his dietary intake at that time.  Lab Results  Component Value Date   HGBA1C 6.5 (A) 11/16/2019   Lab Results  Component Value Date   LDLCALC 82 05/11/2019    Essential Hypertension Slightly elevated today. Denies chest pain, shortness of breath, palpitations, lightheadedness, dizziness, headaches or BLE edema. He endorses medication adherence taking losartan 100 mg daily and amlodipine 10 mg daily. He does not monitor his blood pressure at home.  BP Readings from Last 3 Encounters:  11/16/19 (!) 146/80  06/22/19 138/89  06/03/19 (!) 141/84    Vertigo - Dizziness He has chronic intermittent dizziness. Aggravated by positional changes. he is a smoker. The patient describes the symptoms as disequilibrium. He has been referred to Physical therapy for vestibular rehab in the past. He has been treated with meclizine (Antivert) in the past with good improvement.    Review of Systems  Constitutional: Negative for fever, malaise/fatigue and weight loss.  HENT: Negative.  Negative for nosebleeds.   Eyes: Negative.  Negative for blurred vision, double vision and photophobia.  Respiratory: Negative.  Negative for cough and shortness of breath.   Cardiovascular: Negative.  Negative for chest pain, palpitations and leg swelling.  Gastrointestinal: Negative.  Negative for heartburn, nausea and vomiting.  Musculoskeletal: Negative.  Negative for myalgias.  Neurological: Negative.  Negative for dizziness, focal weakness, seizures and headaches.  Psychiatric/Behavioral: Negative.  Negative for suicidal ideas.    Past Medical History:  Diagnosis Date   Abnormal EKG    Anxiety    Diabetes  mellitus without complication (HCC)    Dizzy    Hypertension    Kidney stones    Racing heart beat     Past Surgical History:  Procedure Laterality Date   CYSTOSCOPY     LEFT URETEROSCOPIC STONE MANIPULATION AND REMOVAL; PLACEMENT OF  LEFT DOUBLE-J URETERAL STENT   CYSTOSCOPY W/ URETERAL STENT PLACEMENT     LEFT DOUBLE-J URETERAL STENT   ORIF ANKLE FRACTURE Left 08/17/2012   Procedure: OPEN REDUCTION INTERNAL FIXATION (ORIF) ANKLE FRACTURE/Left;  Surgeon: Wylene Simmer, MD;  Location: [place];  Service: Orthopedics;  Laterality: Left;   TIBIA IM NAIL INSERTION Left 08/17/2012   Procedure: INTRAMEDULLARY (IM) NAIL TIBIAL/Left;  Surgeon: Wylene Simmer, MD;  Location: [place];  Service: Orthopedics;  Laterality: Left;    Family History  Problem Relation Age of Onset   Hypertension Father        lived to 74   Other Brother        MVA   Kidney disease Sister    Other Brother        Stomach problems    Social History Reviewed with no changes to be made today.   Outpatient Medications Prior to Visit  Medication Sig Dispense Refill   Blood Glucose Monitoring Suppl (ONE TOUCH ULTRA 2) w/Device KIT Check blood sugar by fingerstick 2x a day. ICD10 E11.8 and E11.65 1 each 0   cholecalciferol (VITAMIN D) 1000 units tablet Take 1,000 Units by mouth daily.     fish oil-omega-3 fatty acids 1000 MG capsule Take 1 g by mouth daily.     fluticasone (FLONASE) 50 MCG/ACT nasal spray PLACE 2 SPRAYS INTO BOTH NOSTRILS DAILY. 16 g 2   Glecaprevir-Pibrentasvir (MAVYRET) 100-40 MG TABS Take 3 tablets by mouth daily with breakfast. 84 tablet 1   glucose blood (ONETOUCH ULTRA) test strip Use as instructed. Check blood glucose level by fingerstick twice per day.  E11.65 200 each 3   Insulin Pen Needle 31G X 5 MM MISC Use as instructed. 100 each 12   losartan (COZAAR) 100 MG tablet Take 1 tablet by mouth once daily 90 tablet 0   NON FORMULARY [facility]  Anti-fungal (nail)-#1      pravastatin (PRAVACHOL) 20 MG tablet Take 1 tablet (20 mg total) by mouth daily. 90 tablet 3   traZODone (DESYREL) 100 MG tablet TAKE 1 TABLET BY MOUTH AT BEDTIME 90 tablet 0   TRUEPLUS LANCETS 28G MISC Use as instructed 100 each 3   amLODipine (NORVASC) 10 MG tablet Take 1 tablet (10 mg total) by mouth daily. 90 tablet 1   LEVEMIR FLEXTOUCH 100 UNIT/ML FlexPen INJECT 20 UNITS SUBCUTANEOUSLY ONCE DAILY 15 mL 1   metFORMIN (GLUCOPHAGE) 500 MG tablet Take 1 tablet (500 mg total) by mouth 2 (two) times daily with a meal. 180 tablet 3   No facility-administered medications prior to visit.    Allergies  Allergen Reactions   Tylenol [Acetaminophen] Other (See Comments)    ELEVATED LFTs       Objective:  BP (!) 146/80 (BP Location: Left Arm, Patient Position: Sitting, Cuff Size: Normal)    Pulse 94    Temp 97.7 F (36.5 C) (Temporal)    Ht 5' 9"  (1.753 m)    Wt 200 lb 3.2 oz (90.8 kg)    SpO2 96%    BMI 29.56 kg/m   Wt Readings from Last 3 Encounters:  11/16/19 200 lb 3.2 oz (90.8 kg)  06/22/19 208 lb (94.3 kg)  05/11/19 209 lb (94.8 kg)    Physical Exam Vitals and nursing note reviewed.  Constitutional:      Appearance: He is well-developed.  HENT:     Head: Normocephalic and atraumatic.  Cardiovascular:     Rate and Rhythm: Normal rate and regular rhythm.     Heart sounds: Normal heart sounds. No murmur heard.  No friction rub. No gallop.   Pulmonary:     Effort: Pulmonary effort is normal. No tachypnea or respiratory distress.     Breath sounds: Normal breath sounds. No decreased breath sounds, wheezing, rhonchi or rales.  Chest:     Chest wall: No tenderness.  Abdominal:     General: Bowel sounds are normal.     Palpations: Abdomen is soft.  Musculoskeletal:        General: Normal range of motion.     Cervical back: Normal range of motion.  Skin:    General: Skin is warm and dry.  Neurological:     Mental Status: He is alert and oriented to person, place, and  time.     Sensory: Sensation is intact.     Motor: Motor function is intact.     Coordination: Coordination normal.  Psychiatric:        Behavior: Behavior normal. Behavior is cooperative.        Thought Content: Thought content normal.        Judgment: Judgment normal.          Patient has been counseled extensively about nutrition and exercise as well as the importance of adherence with medications and regular follow-up. The patient was given clear instructions to go to ER or return to medical center if symptoms don't improve, worsen or new problems develop. The patient verbalized understanding.   Follow-up: Return for meter check with luke in 6 weeks. See me in 3 months.    Gildardo Pounds, FNP-BC [facility] and Catharine Chestnut, [place]   11/16/2019, 12:33 PM

## 2019-11-18 ENCOUNTER — Ambulatory Visit: Payer: Medicare Other | Attending: Nurse Practitioner

## 2019-11-18 ENCOUNTER — Encounter (HOSPITAL_COMMUNITY): Payer: Medicaid Other

## 2019-11-18 ENCOUNTER — Other Ambulatory Visit: Payer: Self-pay

## 2019-11-18 DIAGNOSIS — E118 Type 2 diabetes mellitus with unspecified complications: Secondary | ICD-10-CM | POA: Diagnosis not present

## 2019-11-18 DIAGNOSIS — Z794 Long term (current) use of insulin: Secondary | ICD-10-CM | POA: Diagnosis not present

## 2019-11-18 DIAGNOSIS — N400 Enlarged prostate without lower urinary tract symptoms: Secondary | ICD-10-CM | POA: Diagnosis not present

## 2019-11-19 LAB — BASIC METABOLIC PANEL
BUN/Creatinine Ratio: 14 (ref 10–24)
BUN: 11 mg/dL (ref 8–27)
CO2: 23 mmol/L (ref 20–29)
Calcium: 9.6 mg/dL (ref 8.6–10.2)
Chloride: 101 mmol/L (ref 96–106)
Creatinine, Ser: 0.78 mg/dL (ref 0.76–1.27)
GFR calc Af Amer: 106 mL/min/{1.73_m2} (ref >59)
GFR calc non Af Amer: 92 mL/min/{1.73_m2} (ref >59)
Glucose: 132 mg/dL — ABNORMAL HIGH (ref 65–99)
Potassium: 4 mmol/L (ref 3.5–5.2)
Sodium: 140 mmol/L (ref 134–144)

## 2019-11-19 LAB — PSA: Prostate Specific Ag, Serum: 0.8 ng/mL (ref 0.0–4.0)

## 2019-11-25 ENCOUNTER — Ambulatory Visit (HOSPITAL_COMMUNITY): Admission: RE | Admit: 2019-11-25 | Payer: Medicare Other | Source: Ambulatory Visit

## 2019-11-26 ENCOUNTER — Other Ambulatory Visit: Payer: Self-pay

## 2019-11-26 ENCOUNTER — Ambulatory Visit (HOSPITAL_COMMUNITY)
Admission: RE | Admit: 2019-11-26 | Discharge: 2019-11-26 | Disposition: A | Discharge status: Home / Self Care | Payer: Medicare Other | Source: Ambulatory Visit | Attending: Nurse Practitioner | Admitting: Nurse Practitioner

## 2019-11-26 DIAGNOSIS — R42 Dizziness and giddiness: Secondary | ICD-10-CM

## 2019-11-26 DIAGNOSIS — I10 Essential (primary) hypertension: Secondary | ICD-10-CM | POA: Diagnosis not present

## 2019-12-23 ENCOUNTER — Other Ambulatory Visit: Payer: Self-pay | Admitting: Nurse Practitioner

## 2019-12-23 DIAGNOSIS — E785 Hyperlipidemia, unspecified: Secondary | ICD-10-CM

## 2019-12-23 DIAGNOSIS — G4700 Insomnia, unspecified: Secondary | ICD-10-CM

## 2019-12-27 ENCOUNTER — Encounter: Payer: Self-pay | Admitting: Infectious Diseases

## 2019-12-27 ENCOUNTER — Ambulatory Visit (INDEPENDENT_AMBULATORY_CARE_PROVIDER_SITE_OTHER): Payer: Medicare Other | Admitting: Infectious Diseases

## 2019-12-27 ENCOUNTER — Other Ambulatory Visit: Payer: Self-pay

## 2019-12-27 VITALS — BP 123/84 | HR 89 | Temp 98.0°F | Resp 17 | Ht 69.0 in | Wt 205.0 lb

## 2019-12-27 DIAGNOSIS — B182 Chronic viral hepatitis C: Secondary | ICD-10-CM | POA: Diagnosis not present

## 2019-12-27 DIAGNOSIS — Z794 Long term (current) use of insulin: Secondary | ICD-10-CM | POA: Diagnosis not present

## 2019-12-27 DIAGNOSIS — E119 Type 2 diabetes mellitus without complications: Secondary | ICD-10-CM

## 2019-12-27 NOTE — Progress Notes (Signed)
Patient Name: Phillip Paul  Date of Birth: 23-Jul-1950  MRN: [id number]  PCP: Gildardo Pounds, NP  Referring Provider: Gildardo Pounds, NP, Ph#: [phone]   Patient Active Problem List   Diagnosis Date Noted  . Chronic viral hepatitis C ([place]) 06/23/2019  . Post-traumatic headache 05/05/2017  . Concussion 03/28/2017  . Whiplash 03/28/2017  . Type 2 diabetes mellitus without complications ([place]) 63/81/7711  . Essential hypertension 09/01/2014  . Chronic pain syndrome 09/01/2014  . Depression (emotion) 06/22/2013  . Pain in joint, ankle and foot, left 06/22/2013  . Denture stomatitis 06/22/2013  . Left tibial fracture 08/17/2012  . Left fibular fracture 08/17/2012  . Dizziness 01/24/2011  . Hyperlipidemia 07/27/2010  . Exertional dyspnea 06/06/2010  . Smoking 06/06/2010  . Tachycardia 06/06/2010    CC:  Hep C FU     HPI/ROS:  Phillip Paul is a 69 y.o. male .   Medication: [place] x 8 weeks  Start Date: 07/10/19  Hepatitis C Genotype: 1a  Fibrosis Score: F4 Fibrotest / FibroScan w/o severe disease   Hepatitis C RNA:   06/22/19 - 201,000   08/06/2019 - < 15   09/14/2019 - < 15   12/27/2019 - pending  Did very well with his medication - no side effects that were too bad. No missed doses. Met with Phillip Paul in follow up and feeling good.  L arm pain has happened in the past and now it is back. Usually happens in the evening. Shooting muscle pains in the elbow.  Takes only half the amount of insulin now with weight loss.  Last A1C 6.5% - he walks everyday and finds value in using diet changes to control his diabetes. He sees a big difference.  No/rare alcohol use.    ROS:  Constitutional: negative for fevers, chills, fatigue, anorexia; + weight loss - intentional with diet/exercise  Respiratory: negative for asthma, wheezing or dyspnea on exertion Cardiovascular: negative for chest pain, dyspnea, palpitations, lower extremity  edema Gastrointestinal: negative for nausea, change in bowel habits, abdominal pain and jaundice Genitourinary: negative for hematuria Hematologic/lymphatic: negative for easy bruising and petechiae Musculoskeletal: L arm pain as described above Neurological: negative for memory problems, paresthesia, gait problems and tremor Behavioral/Psych: negative for excessive alcohol consumption and illegal drug usage All other systems reviewed and are negative      Past Medical History:  Diagnosis Date  . Abnormal EKG   . Anxiety   . Diabetes mellitus without complication ([place])   . Dizzy   . Hypertension   . Kidney stones   . Racing heart beat     Prior to Admission medications   Medication Sig Start Date End Date Taking? Authorizing Provider  amLODipine (NORVASC) 10 MG tablet Take 1 tablet (10 mg total) by mouth daily. 05/11/19   Gildardo Pounds, NP  Blood Glucose Monitoring Suppl (ONE TOUCH ULTRA 2) w/Device KIT Check blood sugar by fingerstick 2x a day. ICD10 E11.8 and E11.65 06/02/18   Charlott Rakes, MD  cholecalciferol (VITAMIN D) 1000 units tablet Take 1,000 Units by mouth daily.    [provider]  fish oil-omega-3 fatty acids 1000 MG capsule Take 1 g by mouth daily.    [provider]  fluticasone (FLONASE) 50 MCG/ACT nasal spray PLACE 2 SPRAYS INTO BOTH NOSTRILS DAILY. 08/26/18   Gildardo Pounds, NP  glucose blood (ONE TOUCH ULTRA TEST) test strip Check blood sugar by fingerstick 2x a day. ICD10 E11.8 and E11.65 06/02/18  Charlott Rakes, MD  Insulin Detemir (LEVEMIR FLEXTOUCH) 100 UNIT/ML Pen Inject 20 Units into the skin daily. Pt. Is taking 20 units of Levemir. 02/02/19   Charlott Rakes, MD  Insulin Pen Needle 31G X 5 MM MISC Use as instructed. 07/18/17   Gildardo Pounds, NP  losartan (COZAAR) 100 MG tablet Take 1 tablet (100 mg total) by mouth daily. 05/11/19   Gildardo Pounds, NP  metFORMIN (GLUCOPHAGE) 500 MG tablet Take 1 tablet (500 mg total) by mouth 2 (two)  times daily with a meal. 05/11/19 06/10/19  Gildardo Pounds, NP  [facility]  Anti-fungal (nail)-#1    [provider]  nortriptyline (PAMELOR) 50 MG capsule Take 2 capsules (100 mg total) by mouth at bedtime. 02/01/19   Pieter Partridge, DO  pravastatin (PRAVACHOL) 20 MG tablet Take 1 tablet (20 mg total) by mouth daily. 01/04/19   Gildardo Pounds, NP  traZODone (DESYREL) 100 MG tablet Take 1 tablet (100 mg total) by mouth at bedtime. 01/04/19   Gildardo Pounds, NP  TRUEPLUS LANCETS 28G MISC Use as instructed 05/07/17   Gildardo Pounds, NP    Allergies  Allergen Reactions  . Tylenol [Acetaminophen] Other (See Comments)    ELEVATED LFTs    Social History   Tobacco Use  . Smoking status: Former Smoker    Packs/day: 0.25  . Smokeless tobacco: Never Used  . Tobacco comment: 1 pk/week  Vaping Use  . Vaping Use: Never used  Substance Use Topics  . Alcohol use: Not Currently    Comment: Pt. stated he have not drank in 4 years.   . Drug use: No     Objective:   Vitals:   12/27/19 0956  BP: 123/84  Pulse: 89  Resp: 17  Temp: 98 F (36.7 C)  SpO2: 94%   Constitutional: in no apparent distress, well developed and well nourished and oriented times 3 Eyes: anicteric Cardiovascular: Cor RRR and No murmurs Respiratory: clear Gastrointestinal: Bowel sounds are normal, liver is not enlarged, spleen is not enlarged Musculoskeletal: peripheral pulses normal, no pedal edema, no clubbing or cyanosis Skin: negative for - jaundice, spider hemangioma, telangiectasia, palmar erythema, ecchymosis and atrophy; no porphyria cutanea tarda Lymphatic: no cervical lymphadenopathy   Laboratory: Genotype:  Lab Results  Component Value Date   HCVGENOTYPE 1a 06/22/2019   HCV viral load: No results found for: HCVQUANT Lab Results  Component Value Date   WBC 6.0 05/11/2019   HGB 15.2 05/11/2019   HCT 45.0 05/11/2019   MCV 89 05/11/2019   PLT 234 05/11/2019     Lab Results  Component Value Date   CREATININE 0.78 11/18/2019   BUN 11 11/18/2019   NA 140 11/18/2019   K 4.0 11/18/2019   CL 101 11/18/2019   CO2 23 11/18/2019    Lab Results  Component Value Date   ALT 30 08/09/2019   AST 25 08/09/2019   GGT 122 (H) 06/22/2019   ALKPHOS 86 05/11/2019    Lab Results  Component Value Date   INR 1.0 06/22/2019   BILITOT 0.6 08/09/2019   ALBUMIN 4.4 05/11/2019    APRI 0.491 (statistically low predictive risk of advanced fibrosis)   FIB-4 1.48 (statistically low predictive risk of advanced fibrosis)   Imaging:  06/2019  Liver: Hyperechoic hepatic parenchyma, suggesting hepatic steatosis. No focal hepatic lesion is seen. Portal vein is patent on color Doppler imaging with normal direction of blood flow towards the liver. Median kPa:  5.9 Diagnostic category: < or = 9 kPa: in the absence of other known clinical signs, rules out cACLD    Assessment & Plan:   Problem List Items Addressed This Visit      Unprioritized   Type 2 diabetes mellitus without complications ([place])    Phillip Paul has done well to implement exercise and dietary changes to help control diabetes.  He is on half the amount of insulin and Metformin and hopeful he can stop insulin soon.  I encouraged him to continue this healthy lifestyle as it will help his overall liver health.      Chronic viral hepatitis C ([place]) - Primary    Phillip Paul was treated with 8 weeks of [place].  He tolerated therapy very well from what he describes and missed no doses.  Serial viral loads including end of treatment were all undetectable.  We will repeat viral load and LFTs today to determine hepatitis C cure following DAA therapy.  Discordance between fibrotest and FibroScan with F4 noted on blood work and very low risk for any chronic longstanding liver disease on scan.  Normal-appearing liver.  We'll repeat fibrotest and FibroScan in 8 months to help determine follow-up liver care necessary and potential  for [facility] screening. We'll see him back in the office to review shortly after these have been completed.       Relevant Orders   Hepatitis C RNA quantitative ([facility])   Hepatic function panel   Liver Fibrosis, FibroTest-ActiTest   US ABDOMEN RUQ W/ELASTOGRAPHY      Janene Madeira, MSN, NP-C [place] [facility] [email or web address] Pager: [phone] Office: [phone] [place]: [phone]

## 2019-12-27 NOTE — Assessment & Plan Note (Signed)
Phillip Paul was treated with 8 weeks of [place].  He tolerated therapy very well from what he describes and missed no doses.  Serial viral loads including end of treatment were all undetectable.  We will repeat viral load and LFTs today to determine hepatitis C cure following DAA therapy.  Discordance between fibrotest and FibroScan with F4 noted on blood work and very low risk for any chronic longstanding liver disease on scan.  Normal-appearing liver.  We'll repeat fibrotest and FibroScan in 8 months to help determine follow-up liver care necessary and potential for [facility] screening. We'll see him back in the office to review shortly after these have been completed.

## 2019-12-27 NOTE — Progress Notes (Signed)
S:     PCP: Geryl Rankins PMH: DM, HTN, kidney stones, anxiety, vertigo (dizziness), chronic hepatitis C  Patient arrives in good spirits. Presents for diabetes evaluation, education, and management. Patient was referred and last seen by her PCP on 11/16/19 at which pt reported average home CBG readings in the 100-130s. Levemir was decreased from 20 to 10 units daily and pt was continued on metformin 500 mg BID.   Today, patient reports medication adherence with Levemir 10 units daily. Reports not taking metformin for the past two weeks due to accidentally filling pill box with an extra amlodipine tablet instead of filling with metformin. Has fixed error and now taking metformin 500 mg daily with breakfast. Reports home sugars are elevated after meals (110s-190s with two highs of 221 and 231). Denies sugars <70.   Family/Social History:  -Fhx: HTN in father; kidney disease is sister -Tobacco use: 1 pack/week  Insurance coverage/medication affordability: Medicaid and Medicare A/B   Current diabetes medications include: Levemir 10 units daily, metformin 500 mg BID (taking 1 tablet daily - AM) Current hypertension medications include: losartan 100 mg daily, amlodipine 10 mg daily Current hyperlipidemia medications include: pravastatin 20 mg daily  Patient denies hypoglycemic events.  Patient reported dietary habits: Eats 3 meals/day Breakfast: egg, sausage, bread, glass of juice, coffee, fruits Lunch: persian food, healthy choice food, limits rice (uses brown rice), soups Dinner: chicken salad, crackers Snacks: apples, bananas, pears, plum, apricot Drinks: water and coffee, juice  Patient-reported exercise habits: walks 5 miles daily for 1-2 hours, rides bicycle   Patient denies nocturia (nighttime urination) - 1-2x/night Patient reports neuropathy (nerve pain). (mild in legs, but does experience muscle in left arm) Patient denies visual changes. Patient denies self foot exams.      O: 12/15 from 12/2 Home fasting blood sugars: none  2 hour post-meal/random blood sugars: 221, 114, 117, 190, 231, 149, 111, 126, 188, 123, 198, 123, 180, 112  Lab Results  Component Value Date   HGBA1C 6.5 (A) 11/16/2019   There were no vitals filed for this visit.  Lipid Panel     Component Value Date/Time   CHOL 143 05/11/2019 1200   TRIG 75 05/11/2019 1200   HDL 46 05/11/2019 1200   CHOLHDL 3.1 05/11/2019 1200   CHOLHDL 3.5 06/22/2013 1000   VLDL 11 06/22/2013 1000   LDLCALC 82 05/11/2019 1200    Clinical Atherosclerotic Cardiovascular Disease (ASCVD): No  The 10-year ASCVD risk score Mikey Bussing DC Jr., et al., 2013) is: 34.7%   Values used to calculate the score:     Age: 79 years     Sex: Male     Is Non-Hispanic African American: No     Diabetic: Yes     Tobacco smoker: Yes     Systolic Blood Pressure: 161 mmHg     Is BP treated: Yes     HDL Cholesterol: 46 mg/dL     Total Cholesterol: 143 mg/dL    A/P: Diabetes longstanding currently controlled with A1C 6.5%, however, home blood sugar control is suboptimal due to missing two weeks of metformin due to pill box error. Patient now filling pill box correctly with metformin daily in the mornings. Patient is able to verbalize appropriate hypoglycemia management plan. Encouraged patient to aim for a diet full of vegetables, fruit and lean meats (chicken, [place], fish) and to limit sugar and carbohydrate intake. Encouraged patient to continue exercising for at least 30 minutes daily with the goal of 150  minutes per week. Patient verbalized understanding. -Continued Levemir 10 units daily -Continued metformin 500 mg daily with breakfast -Extensively discussed pathophysiology of diabetes, recommended lifestyle interventions, dietary effects on blood sugar control -Counseled on s/sx of and management of hypoglycemia -Next A1C anticipated February 2022.   ASCVD risk - primary prevention in patient with diabetes. Last LDL is  controlled. ASCVD risk score is >20%  - high intensity statin indicated. -Continued pravastatin 20 mg daily  Written patient instructions provided.  Total time in face to face counseling 30 minutes.   Follow up Pharmacist Clinic Visit in 4 weeks.    Lorel Monaco, PharmD, [place] PGY2 Ambulatory Care Resident [place]

## 2019-12-27 NOTE — Patient Instructions (Signed)
Please stop by the lab today on your way out - will let you know when your results are back if you have been cured!  Please come back in 8 months for another ultrasound and lab visit - then a visit with me to review next steps for liver care recommendations.   Hope you have a great holiday!

## 2019-12-27 NOTE — Assessment & Plan Note (Signed)
Phillip Paul has done well to implement exercise and dietary changes to help control diabetes.  He is on half the amount of insulin and Metformin and hopeful he can stop insulin soon.  I encouraged him to continue this healthy lifestyle as it will help his overall liver health.

## 2019-12-29 ENCOUNTER — Encounter: Payer: Self-pay | Admitting: Pharmacist

## 2019-12-29 ENCOUNTER — Ambulatory Visit: Payer: Medicare Other | Attending: Nurse Practitioner | Admitting: Pharmacist

## 2019-12-29 ENCOUNTER — Other Ambulatory Visit: Payer: Self-pay

## 2019-12-29 DIAGNOSIS — Z794 Long term (current) use of insulin: Secondary | ICD-10-CM | POA: Diagnosis not present

## 2019-12-29 DIAGNOSIS — E118 Type 2 diabetes mellitus with unspecified complications: Secondary | ICD-10-CM | POA: Diagnosis not present

## 2019-12-29 LAB — HEPATIC FUNCTION PANEL
AG Ratio: 1.3 (calc) (ref 1.0–2.5)
ALT: 23 U/L (ref 9–46)
AST: 18 U/L (ref 10–35)
Albumin: 4 g/dL (ref 3.6–5.1)
Alkaline phosphatase (APISO): 76 U/L (ref 35–144)
Bilirubin, Direct: 0.1 mg/dL (ref 0.0–0.2)
Globulin: 3.2 g/dL (calc) (ref 1.9–3.7)
Indirect Bilirubin: 0.2 mg/dL (calc) (ref 0.2–1.2)
Total Bilirubin: 0.3 mg/dL (ref 0.2–1.2)
Total Protein: 7.2 g/dL (ref 6.1–8.1)

## 2019-12-29 LAB — HEPATITIS C RNA QUANTITATIVE
HCV Quantitative Log: <1.18 log IU/mL
HCV RNA, PCR, QN: <15 IU/mL

## 2019-12-29 MED ORDER — METFORMIN HCL 500 MG PO TABS: 500.0000 mg | ORAL_TABLET | Freq: Every day | ORAL | 3 refills | Status: DC

## 2020-01-03 ENCOUNTER — Telehealth: Payer: Self-pay

## 2020-01-03 ENCOUNTER — Ambulatory Visit (HOSPITAL_COMMUNITY): Payer: Medicare Other

## 2020-01-03 NOTE — Telephone Encounter (Signed)
Attempted to call patient, no answer and voicemail box not set up. Will try again later.   Beryle Flock, RN

## 2020-01-03 NOTE — Telephone Encounter (Signed)
-----   Message from [place] Callas, NP sent at 01/03/2020 11:32 AM EST ----- Please call Phillip Paul to let him know that the hepatitis c virus appears to be cured!  No changes in the plan - will see what the repeat ultrasound shows in 8 months as planned for further recommendations regarding after cure liver care.  Thank you!

## 2020-01-05 ENCOUNTER — Ambulatory Visit (HOSPITAL_COMMUNITY)
Admission: RE | Admit: 2020-01-05 | Discharge: 2020-01-05 | Disposition: A | Discharge status: Home / Self Care | Payer: Medicare Other | Source: Ambulatory Visit | Attending: Infectious Diseases | Admitting: Infectious Diseases

## 2020-01-05 ENCOUNTER — Other Ambulatory Visit: Payer: Self-pay

## 2020-01-05 DIAGNOSIS — B182 Chronic viral hepatitis C: Secondary | ICD-10-CM | POA: Insufficient documentation

## 2020-01-05 DIAGNOSIS — R1011 Right upper quadrant pain: Secondary | ICD-10-CM | POA: Diagnosis not present

## 2020-01-05 NOTE — Telephone Encounter (Signed)
Attempted to contact patient. Patient did not answer and no voicemail set up

## 2020-01-06 NOTE — Telephone Encounter (Signed)
Attempted to call patient with results, no answer and voicemail box not set up. Will route to [facility] for scheduling of ultrasounds and follow-up.  Beryle Flock, RN

## 2020-01-06 NOTE — Telephone Encounter (Signed)
Spoke with patient to let him know per Janene Madeira, NP that his hepatitis C appears to be cured, no changes to the plan. Advised him that she will see what the repeat ultrasound shows in 8 months for further recommendations. Patient states he had an ultrasound yesterday and was wondering if we got the results yet.   Beryle Flock, RN

## 2020-01-06 NOTE — Telephone Encounter (Signed)
Oh that was supposed to be done again in 8 months for 1 yr follow up   Well I can see that he does have changes consistent with cirrhosis, which is permanent scarring. We will need to proceed with q62m liver ultrasounds to make sure no changes or concerns for liver tumors. The fact that he has cured his hepatitis C is the single best thing he could have done to decrease his risk for liver cancer related to hepatitis. Should also co nsider getting him plugged in with a liver specialist / GI doctor for long term liver care.    Please encourage him to continue with his improved nutrition and exercise plan as he has been doing.   Will need to arrange a regular RUQ liver ultrasound again in 61m and again in 1 year with follow up office visit with me in 1 yr after the second U/S.   Eventually we can transition screenings to his PCP.   Thank you!

## 2020-01-11 NOTE — Telephone Encounter (Signed)
Spoke with patient to let him know that per Rexene Alberts, NP his latest ultrasound shows changes consistent with cirrhosis and permanent scarring. Advised him that Judeth Cornfield would like to continue with an ultrasound every 6 months and follow-up with him after the second ultrasound in 1 year.   Relayed to patient that Judeth Cornfield says the fact that he has cured his Hepatitis C is the best thing he could have done to reduce his risk for cancer related to hepatitis.   Encouraged patient to continue with his improved nutrition and exercise. Patient verbalized understanding and has no further questions.   Sandie Ano, RN

## 2020-01-17 IMAGING — US US ABDOMEN LIMITED
1 series · 14 of 25 positions shown · non-contrast
Comparison: None.

CLINICAL DATA: Elevated liver function studies

EXAM:
ULTRASOUND ABDOMEN LIMITED RIGHT UPPER QUADRANT

[Series 1: us abdomen limited · 0.20mm/px · 14 of 37 slices shown]
[im 1/37]
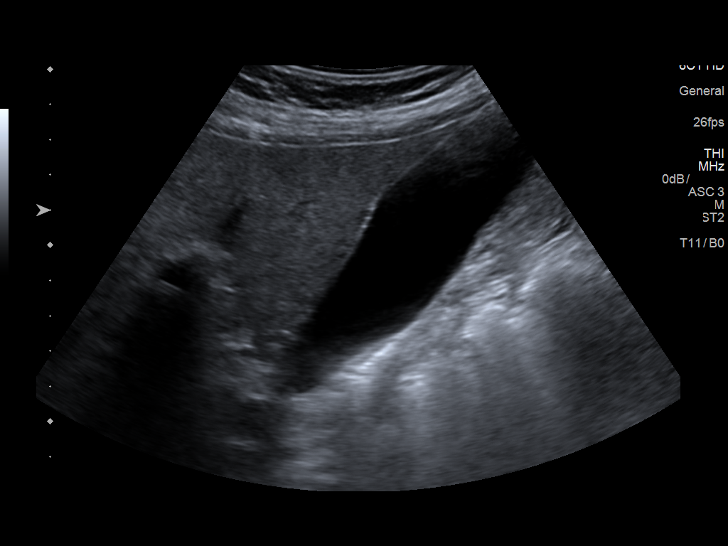
[im 4/37]
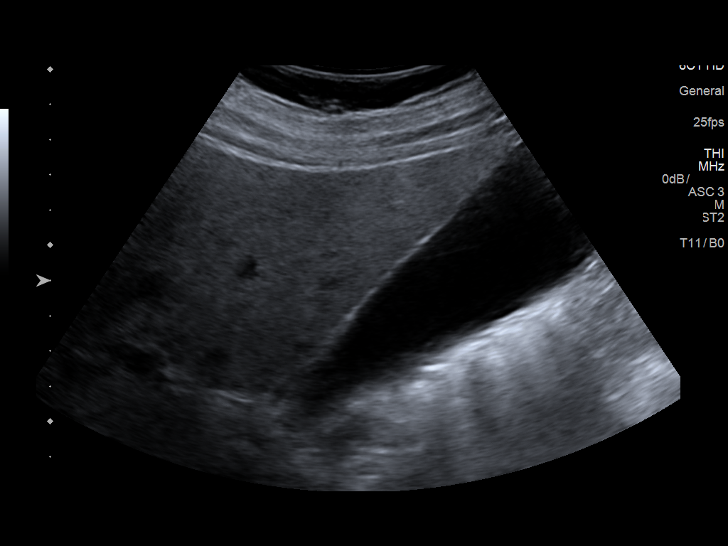
[im 7/37]
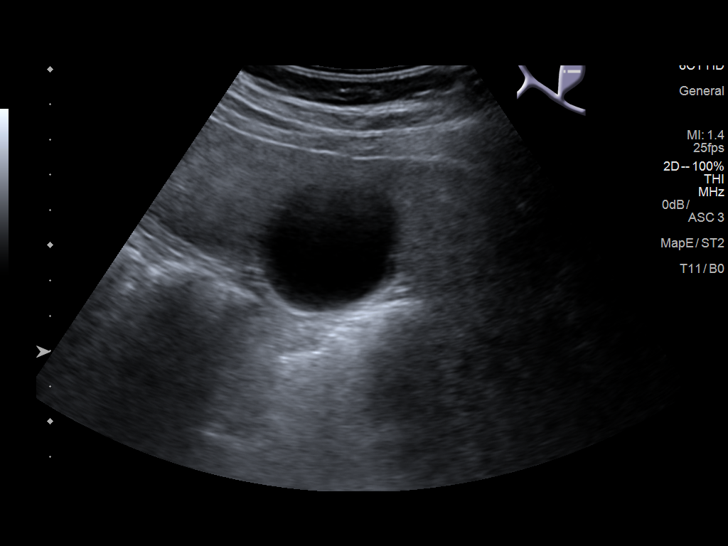
[im 10/37]
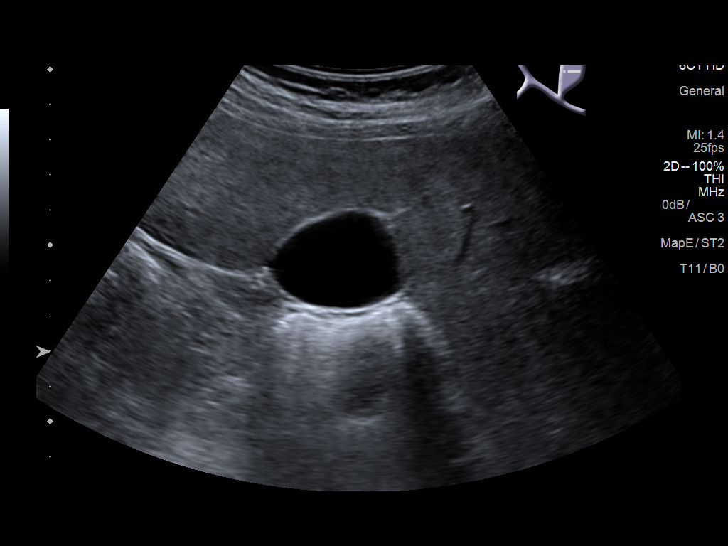
[im 13/37]
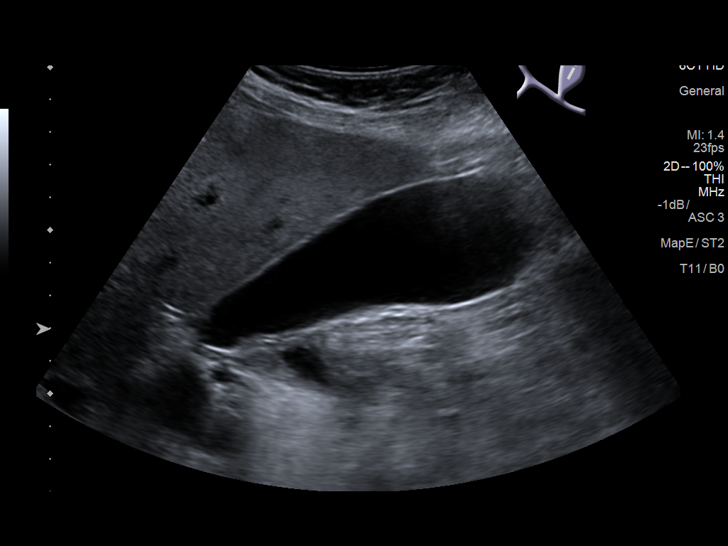
[im 14/37]
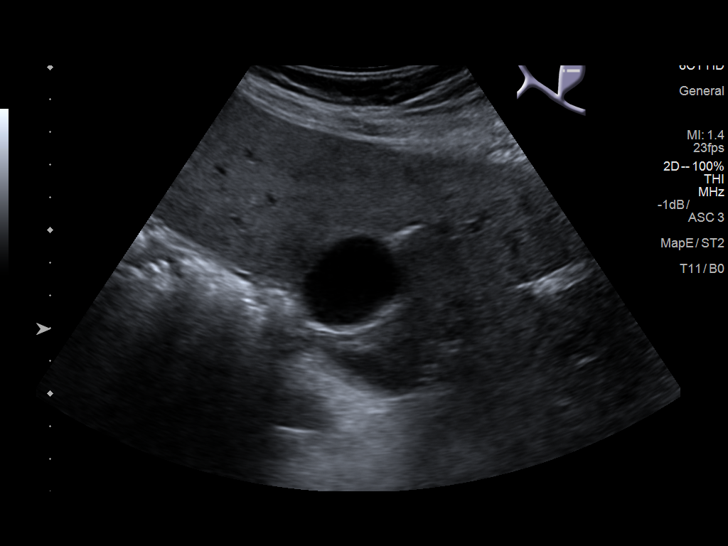
[im 17/37]
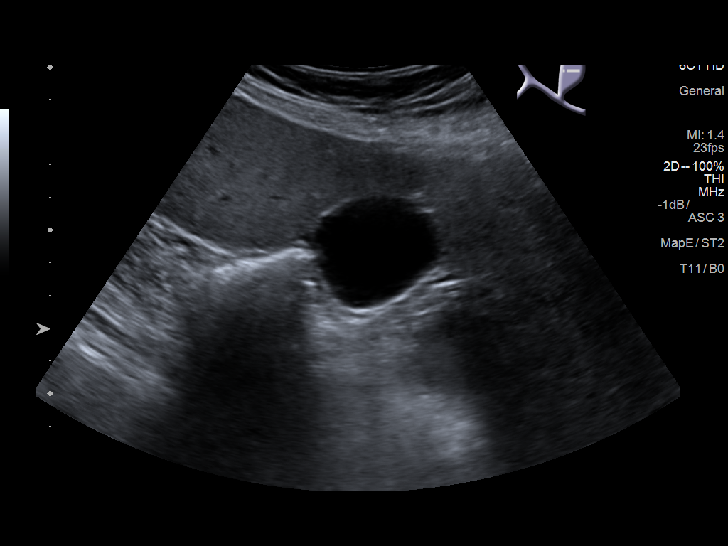
[im 20/37]
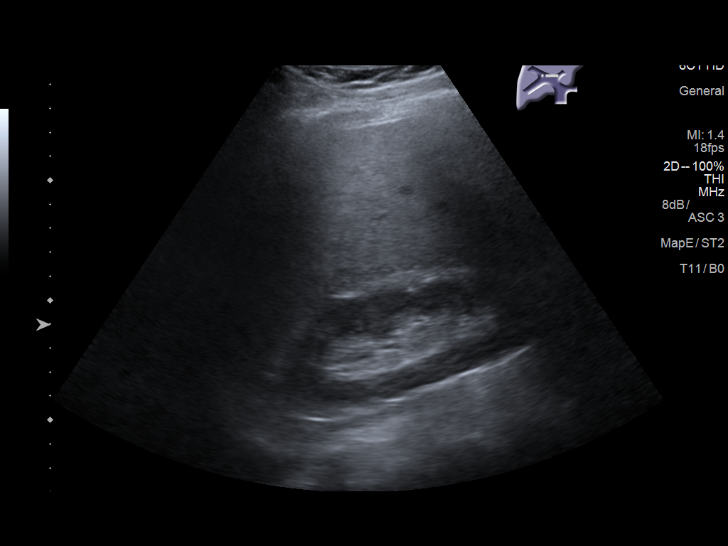
[im 23/37]
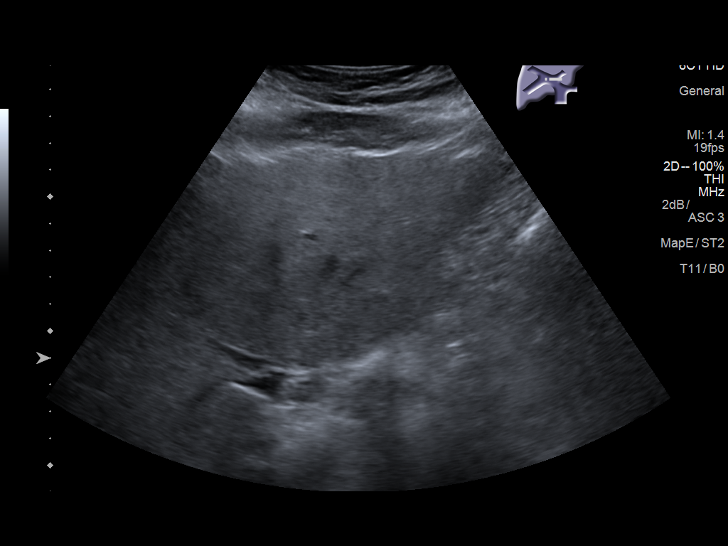
[im 25/37]
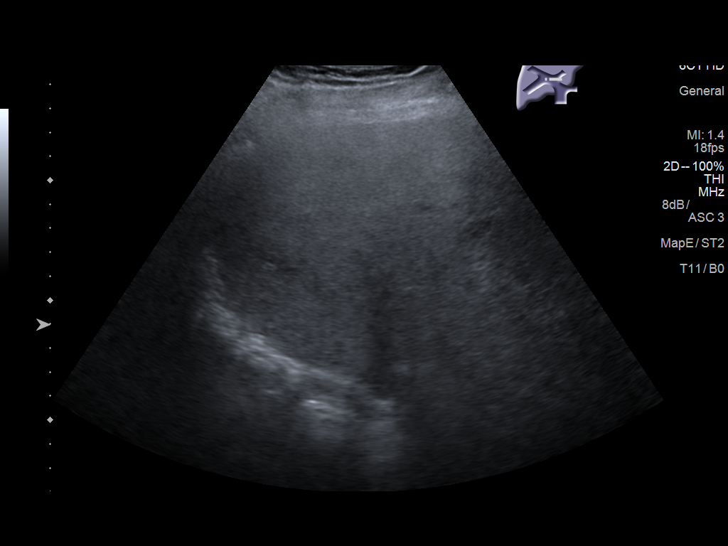
[im 28/37]
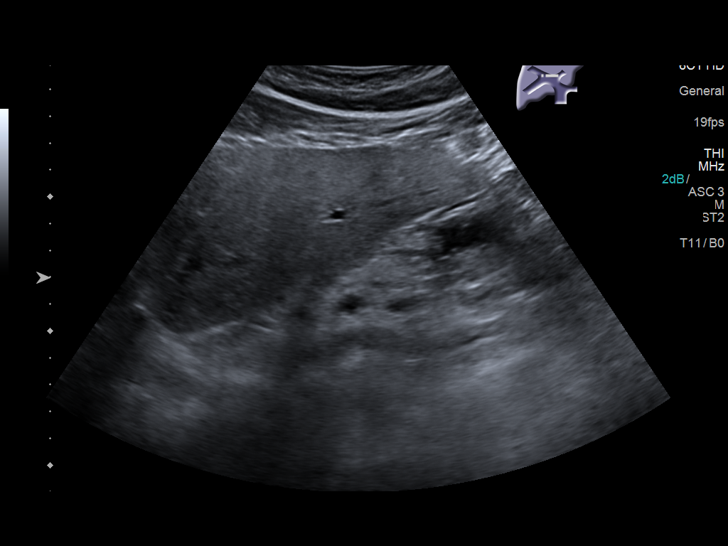
[im 31/37]
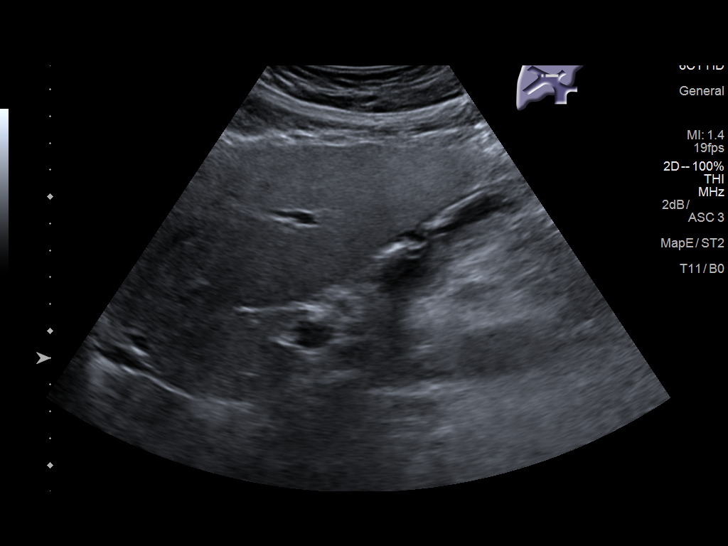
[im 34/37]
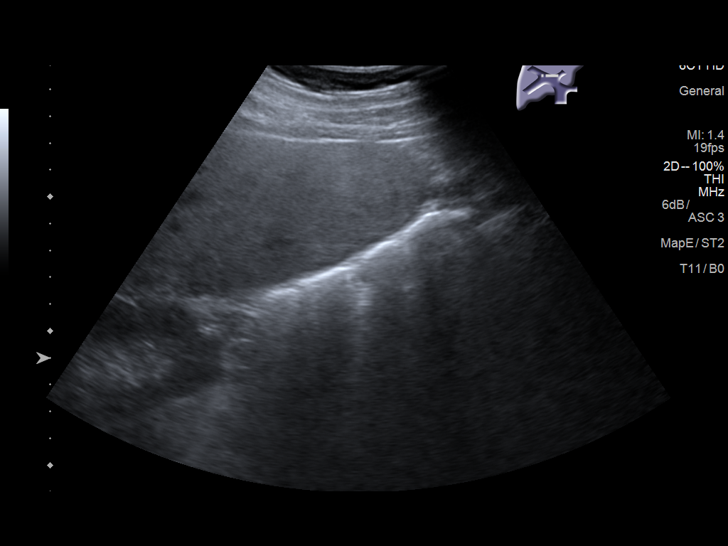
[im 37/37]
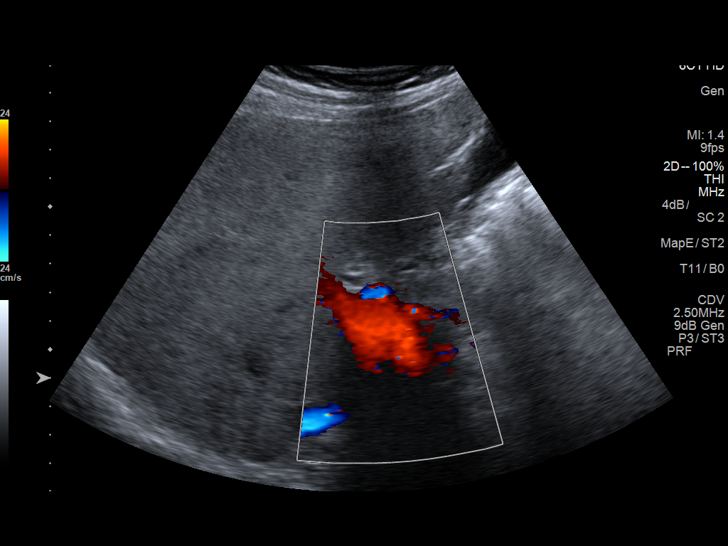

[14 of 25 positions shown; findings below may reference images not displayed]

FINDINGS: Gallbladder:

No gallstones or wall thickening visualized. No sonographic Murphy
sign noted by sonographer.

Common bile duct:

Diameter: 4.4 mm

Liver:

The hepatic echotexture is mildly increased. The surface contour
remains smooth. There is no focal mass nor ductal dilation. Portal
vein is patent on color Doppler imaging with normal direction of
blood flow towards the liver.
IMPRESSION: Mildly increased hepatic echotexture may reflect fatty infiltrative
change or early changes of other hepatocellular disease. No classic
cirrhotic changes.

Normal appearance of the gallbladder and common bile duct.

## 2020-01-20 ENCOUNTER — Other Ambulatory Visit: Payer: Self-pay | Admitting: Nurse Practitioner

## 2020-01-20 DIAGNOSIS — I10 Essential (primary) hypertension: Secondary | ICD-10-CM

## 2020-01-22 ENCOUNTER — Other Ambulatory Visit: Payer: Self-pay | Admitting: Nurse Practitioner

## 2020-01-22 DIAGNOSIS — I10 Essential (primary) hypertension: Secondary | ICD-10-CM

## 2020-01-25 NOTE — Progress Notes (Signed)
S:    PCP: Geryl Rankins PMH: DM, HTN, kidney stones, anxiety, vertigo (dizziness), chronic hepatitis C  Patient arrives in good spirits. Presents for diabetes evaluation, education, and management. Patient was referred and last seen by her PCP on 11/16/19. Last seen by pharmacy on 12/29/19 at which time A1C was controlled and no changes were made to medication DM regimen.  Today, patient reports medication adherence with Levemir 10 units daily and metformin 1 tablet daily. Reports polyuria (3-5x/night), polydipsia, dry mouth, neuropathy, and mild blurry vision for a couple of weeks. Home post-prandial sugars range 100s-160s with two highs of 200 and 207. Additionally, reports dry skin and peeling around both ankles.  Family/Social History:  -Fhx: HTN in father; kidney disease is sister -Tobacco use: 1 pack/week  Insurance coverage/medication affordability: Medicaid and Medicare A/B  Medication adherence reported good .   Current diabetes medications include: Levemir 10 units daily, metformin 500 mg daily Current hypertension medications include: losartan 100 mg daily, amlodipine 10 mg daily Current hyperlipidemia medications include: pravastatin 20 mg daily  Patient denies hypoglycemic events.  Patient reported dietary habits: Eats 3 meals/day Breakfast: egg, sausage, bread, glass of juice, coffee, fruits Lunch: persian food, healthy choice food, limits rice (uses brown rice), soups Dinner: chicken salad, crackers Snacks: apples, bananas, pears, plum, apricot Drinks: water and coffee, juice  Patient-reported exercise habits: walks 5 miles daily for 1-2 hours, rides bicycle   Patient reports nocturia (nighttime urination). (3-5x/night) Patient reports neuropathy (nerve pain). Patient reports visual changes. Patient reports self foot exams.     O:  POCT: 92 (post prandial)  Home fasting blood sugars: 141, 151, 113 2 hour post-meal/random blood sugars: 200, 149, 106,  131, 138, 204, 89, 169, 207, 139, 138, 103  Lab Results  Component Value Date   HGBA1C 6.5 (A) 11/16/2019   There were no vitals filed for this visit.  Lipid Panel     Component Value Date/Time   CHOL 143 05/11/2019 1200   TRIG 75 05/11/2019 1200   HDL 46 05/11/2019 1200   CHOLHDL 3.1 05/11/2019 1200   CHOLHDL 3.5 06/22/2013 1000   VLDL 11 06/22/2013 1000   LDLCALC 82 05/11/2019 1200    Clinical Atherosclerotic Cardiovascular Disease (ASCVD): No  The 10-year ASCVD risk score Mikey Bussing DC Jr., et al., 2013) is: 34.7%   Values used to calculate the score:     Age: 70 years     Sex: Male     Is Non-Hispanic African American: No     Diabetic: Yes     Tobacco smoker: Yes     Systolic Blood Pressure: 629 mmHg     Is BP treated: Yes     HDL Cholesterol: 46 mg/dL     Total Cholesterol: 143 mg/dL    A/P: Diabetes longstanding currently controlled with AC 6.5%, however patient has been experiencing symptoms of hyperglycemia (polyuria, polydipsia, dry mouth, neuropathy, and mild blurry vision). Medication adherence appears optimal. Patient is able to verbalize appropriate hypoglycemia management plan.  -Started Trulicity 5.28 mg once weekly -Decreased Levemir from 10 units to 5 units daily -Continued metformin 500 mg daily -If tolerating Trulicity well, can consider increasing Trulicity to 1.5 mg weekly and reduce insulin requirement at next visit. -Extensively discussed pathophysiology of diabetes, recommended lifestyle interventions, dietary effects on blood sugar control -Counseled on s/sx of and management of hypoglycemia -Next A1C anticipated 02/2020 (next month)  ASCVD risk - primary prevention in patient with diabetes. Last LDL is controlled. ASCVD  risk score is >20%  - high intensity statin indicated. -Continued pravastatin 20 mg daily  Written patient instructions provided. Total time in face to face counseling 25 minutes.   Follow up PCP Clinic Visit in 3 weeks.     Lorel Monaco, PharmD, [place] PGY2 Ambulatory Care Resident [place]

## 2020-01-26 ENCOUNTER — Other Ambulatory Visit: Payer: Self-pay

## 2020-01-26 ENCOUNTER — Ambulatory Visit: Payer: Medicare Other | Attending: Nurse Practitioner | Admitting: Pharmacist

## 2020-01-26 ENCOUNTER — Encounter: Payer: Self-pay | Admitting: Pharmacist

## 2020-01-26 DIAGNOSIS — Z794 Long term (current) use of insulin: Secondary | ICD-10-CM

## 2020-01-26 DIAGNOSIS — E118 Type 2 diabetes mellitus with unspecified complications: Secondary | ICD-10-CM

## 2020-01-26 LAB — GLUCOSE, POCT (MANUAL RESULT ENTRY): POC Glucose: 92 mg/dl (ref 70–99)

## 2020-01-26 MED ORDER — TRULICITY 0.75 MG/0.5ML ~~LOC~~ SOAJ: 0.7500 mg | SUBCUTANEOUS | 0 refills | Status: DC

## 2020-01-26 MED ORDER — LEVEMIR FLEXTOUCH 100 UNIT/ML ~~LOC~~ SOPN: 5.0000 [IU] | PEN_INJECTOR | Freq: Every day | SUBCUTANEOUS | 6 refills | Status: DC

## 2020-01-26 NOTE — Patient Instructions (Addendum)
It was nice to see you today!  Your goal blood sugar is 80-130 before eating and less than 180 after eating. In clinic, your blood sugar was 92 mg/dL.  Medication Changes: Begin injecting Trulicity 9.98 mg once weekly  Begin taking Levemir 5 units daily   Continue metformin 500 mg once daily  Monitor blood sugars at home and keep a log (glucometer or piece of paper) to bring with you to your next visit.  Keep up the good work with diet and exercise. Aim for a diet full of vegetables, fruit and lean meats (chicken, [place], fish). Try to limit salt intake by eating fresh or frozen vegetables (instead of canned), rinse canned vegetables prior to cooking and do not add any additional salt to meals.

## 2020-02-03 ENCOUNTER — Other Ambulatory Visit: Payer: Self-pay | Admitting: Infectious Diseases

## 2020-02-03 DIAGNOSIS — B182 Chronic viral hepatitis C: Secondary | ICD-10-CM

## 2020-02-15 NOTE — Progress Notes (Unsigned)
S:     PCP: Geryl Rankins PMH: DM, HTN, kidney stones, anxiety, vertigo (dizziness), chronic hepatitis C  Patient arrivesin good spirits. Presents for diabetes evaluation, education, and management.Patient was referred and last seen byher PCPon11/2/21. Last seen by pharmacy on 01/26/20 at which POCT BG was 92 and home after-meal BG ranged 140-160s, however pt reported polyuria, polydipsia, neuropathy, and mild blurry vision. Pt started on Trulicity 3.81 mg weekly and decrease Levemir to 5 units daily.  Today, patient reports ***  Check A1C today?? Last A1C at goal in November  A1C = Check Clinic BG Review medications and adherence (timing of meds, etc.)  Ate or drank anything prior to visit today? At home BGs?  Marland Kitchen Highs . Lows  Hyperglycemia sx (nocturia, neuropathy, visual changes, foot exams) Hypoglycemia symptoms (dizziness, shaky, sweating, hungry, confusion)  Plan: -Increase Trulicity to 8.29 mg weekly and consider discontinuing Levemir and f/u in 4 weeks   Family/Social History: -Fhx: HTN in father; kidney disease is sister -Tobacco use: 1 pack/week  Insurance coverage/medication affordability:Medicaid and Medicare A/B  Medication adherence reported *** .   Current diabetes medications include:Trulicity 9.37 mg weekly, Levemir 5 units daily, metformin 500 mg daily Current hypertension medications include:losartan 100 mg daily, amlodipine 10 mg daily Current hyperlipidemia medications include:pravastatin 20 mg daily  Patient {Actions; denies-reports:120008} hypoglycemic events.  Patient reported dietary habits: Eats31meals/day Breakfast:egg, sausage, bread, glass of juice, coffee, fruits Lunch:persian food, healthy choice food, limits rice (uses brown rice), soups Dinner:chicken salad, crackers Snacks:apples, bananas, pears, plum, apricot Drinks:water and coffee, juice  Patient-reported exercise habits:walks 5 miles daily for 1-2 hours, rides  bicycle   Patient {Actions; denies-reports:120008} nocturia (nighttime urination).  Patient {Actions; denies-reports:120008} neuropathy (nerve pain). Patient {Actions; denies-reports:120008} visual changes. Patient {Actions; denies-reports:120008} self foot exams.     O:  POCT:   Home fasting blood sugars:  2 hour post-meal/random blood sugars:  Lab Results  Component Value Date   HGBA1C 6.5 (A) 11/16/2019   There were no vitals filed for this visit.  Lipid Panel     Component Value Date/Time   CHOL 143 05/11/2019 1200   TRIG 75 05/11/2019 1200   HDL 46 05/11/2019 1200   CHOLHDL 3.1 05/11/2019 1200   CHOLHDL 3.5 06/22/2013 1000   VLDL 11 06/22/2013 1000   LDLCALC 82 05/11/2019 1200   Clinical Atherosclerotic Cardiovascular Disease (ASCVD): No  The 10-year ASCVD risk score Mikey Bussing DC Jr., et al., 2013) is: 34.7%   Values used to calculate the score:     Age: 70 years     Sex: Male     Is Non-Hispanic African American: No     Diabetic: Yes     Tobacco smoker: Yes     Systolic Blood Pressure: 169 mmHg     Is BP treated: Yes     HDL Cholesterol: 46 mg/dL     Total Cholesterol: 143 mg/dL    A/P: Diabetes longstanding currently controlled with A1C 6.5%. Patient is *** able to verbalize appropriate hypoglycemia management plan. Medication adherence appears ***. Control is suboptimal due to ***. - -Continued metformin 500 mg daily -Extensively discussed pathophysiology of diabetes, recommended lifestyle interventions, dietary effects on blood sugar control -Counseled on s/sx of and management of hypoglycemia -Next A1C anticipated 02/2020   ASCVD risk - primary prevention in patient with diabetes. Last LDLiscontrolled. ASCVD risk score is>20% - highintensity statin indicated. -Continued pravastatin 20 mg daily  Written patient instructions provided.  Total time in face to face  counseling *** minutes.   Follow up Pharmacist/PCP*** Clinic Visit in ***.     Lorel Monaco, PharmD, [place] PGY2 Ambulatory Care Resident [place]

## 2020-02-16 ENCOUNTER — Other Ambulatory Visit: Payer: Self-pay

## 2020-02-16 ENCOUNTER — Ambulatory Visit: Payer: Medicaid Other | Admitting: Pharmacist

## 2020-02-16 ENCOUNTER — Ambulatory Visit: Payer: Medicare Other | Attending: Nurse Practitioner | Admitting: Nurse Practitioner

## 2020-02-16 ENCOUNTER — Encounter: Payer: Self-pay | Admitting: Nurse Practitioner

## 2020-02-16 DIAGNOSIS — E118 Type 2 diabetes mellitus with unspecified complications: Secondary | ICD-10-CM | POA: Diagnosis not present

## 2020-02-16 DIAGNOSIS — I10 Essential (primary) hypertension: Secondary | ICD-10-CM

## 2020-02-16 DIAGNOSIS — E785 Hyperlipidemia, unspecified: Secondary | ICD-10-CM | POA: Diagnosis not present

## 2020-02-16 DIAGNOSIS — Z794 Long term (current) use of insulin: Secondary | ICD-10-CM

## 2020-02-16 NOTE — Progress Notes (Signed)
Virtual Visit via Telephone Note Due to national recommendations of social distancing due to [place] 19, telehealth visit is felt to be most appropriate for this patient at this time.  I discussed the limitations, risks, security and privacy concerns of performing an evaluation and management service by telephone and the availability of in person appointments. I also discussed with the patient that there may be a patient responsible charge related to this service. The patient expressed understanding and agreed to proceed.    I connected with Phillip Paul on 02/16/20  at   8:30 AM EST  EDT by telephone and verified that I am speaking with the correct person using two identifiers.   Consent I discussed the limitations, risks, security and privacy concerns of performing an evaluation and management service by telephone and the availability of in person appointments. I also discussed with the patient that there may be a patient responsible charge related to this service. The patient expressed understanding and agreed to proceed.   Location of Patient: Private Residence   Location of Provider: [facility] [employer] [facility]    Persons participating in Telemedicine visit: Geryl Rankins FNP-BC [place]    History of Present Illness: Telemedicine visit for: Establish Care  has a past medical history of Abnormal EKG, Anxiety, Diabetes mellitus without complication ([place]), Dizzy, Hypertension, Kidney stones, and Racing heart beat.  He has an appointment with the oral surgeon today for consultation for extraction of a molar.   DM 2 Well controlled. Postprandial 180 after breakfast today.  He did have processed sweetened oatmeal which she is aware will cause spikes in his blood glucose levels.  Yesterday's fasting reading 109. Pain in stomach with taking Trulicity 5.99HF weekly. He has been on this medication for 3 weeks. Will wait one additional week  and if still continues with stomach pain we will switch to farxiga or jardiance. He denies any hypo or hyperglycemic symptoms. He is also taking levemir 5 units daily and metformin 500 mg daily.  He is on statin and ARB.  LDL not quite at goal with pravastatin 20 mg daily.  Blood pressure has been controlled Lab Results  Component Value Date   HGBA1C 6.5 (A) 11/16/2019   Lab Results  Component Value Date   LDLCALC 82 05/11/2019    Essential Hypertension Currently taking losartan 100 mg daily and amlodipine 10 mg daily as prescribed. Denies chest pain, shortness of breath, palpitations, lightheadedness, dizziness, headaches or BLE edema.  BP Readings from Last 3 Encounters:  12/27/19 123/84  11/16/19 (!) 146/80  06/22/19 138/89    Past Medical History:  Diagnosis Date  . Abnormal EKG   . Anxiety   . Diabetes mellitus without complication ([place])   . Dizzy   . Hypertension   . Kidney stones   . Racing heart beat     Past Surgical History:  Procedure Laterality Date  . CYSTOSCOPY     LEFT URETEROSCOPIC STONE MANIPULATION AND REMOVAL; PLACEMENT OF  LEFT DOUBLE-J URETERAL STENT  . CYSTOSCOPY W/ URETERAL STENT PLACEMENT     LEFT DOUBLE-J URETERAL STENT  . ORIF ANKLE FRACTURE Left 08/17/2012   Procedure: OPEN REDUCTION INTERNAL FIXATION (ORIF) ANKLE FRACTURE/Left;  Surgeon: Wylene Simmer, MD;  Location: [facility];  Service: Orthopedics;  Laterality: Left;  . TIBIA IM NAIL INSERTION Left 08/17/2012   Procedure: INTRAMEDULLARY (IM) NAIL TIBIAL/Left;  Surgeon: Wylene Simmer, MD;  Location: [facility];  Service: Orthopedics;  Laterality: Left;  Family History  Problem Relation Age of Onset  . Hypertension Father        lived to 38  . Other Brother        MVA  . Kidney disease Sister   . Other Brother        Stomach problems    Social History   Socioeconomic History  . Marital status: Significant Other    Spouse name: Not on file  . Number of children: 1  . Years of education: Not on file   . Highest education level: Bachelor's degree (e.g., BA, AB, BS)  Occupational History  . Occupation: retired  Tobacco Use  . Smoking status: Former Smoker    Packs/day: 0.25  . Smokeless tobacco: Never Used  . Tobacco comment: 1 pk/week  Vaping Use  . Vaping Use: Never used  Substance and Sexual Activity  . Alcohol use: Not Currently    Comment: Pt. stated he have not drank in 4 years.   . Drug use: No  . Sexual activity: Yes  Other Topics Concern  . Not on file  Social History Narrative   NO FAMILY HX TO REPORT FROM NEW RECORDS      Patient is right-handed. He lives with his girlfriend. He drinks 2-3 cups or tea a day. He does not exercise.   Social Determinants of Health   Financial Resource Strain: Not on file  Food Insecurity: Not on file  Transportation Needs: Not on file  Physical Activity: Not on file  Stress: Not on file  Social Connections: Not on file     Observations/Objective: Awake, alert and oriented x 3   Review of Systems  Constitutional: Negative for fever, malaise/fatigue and weight loss.  HENT: Negative.  Negative for nosebleeds.   Eyes: Negative.  Negative for blurred vision, double vision and photophobia.  Respiratory: Negative.  Negative for cough and shortness of breath.   Cardiovascular: Negative.  Negative for chest pain, palpitations and leg swelling.  Gastrointestinal: Negative.  Negative for heartburn, nausea and vomiting.  Musculoskeletal: Negative.  Negative for myalgias.  Neurological: Negative.  Negative for dizziness, focal weakness, seizures and headaches.  Psychiatric/Behavioral: Negative.  Negative for suicidal ideas.    Assessment and Plan: Phillip Paul was seen today for follow-up.  Diagnoses and all orders for this visit:  Controlled type 2 diabetes mellitus with complication, with long-term current use of insulin ([place]) -     Ambulatory referral to Ophthalmology -     Hemoglobin A1c; Future Continue blood sugar control  as discussed in office today, low carbohydrate diet, and regular physical exercise as tolerated, 150 minutes per week (30 min each day, 5 days per week, or 50 min 3 days per week). Keep blood sugar logs with fasting goal of 90-130 mg/dl, post prandial (after you eat) less than 180.  For Hypoglycemia: BS <60 and Hyperglycemia BS >400; contact the clinic ASAP. Annual eye exams and foot exams are recommended.   Essential hypertension -     CMP14+EGFR; Future Continue all antihypertensives as prescribed.  Remember to bring in your blood pressure log with you for your follow up appointment.  DASH/Mediterranean Diets are healthier choices for HTN.    Dyslipidemia, goal LDL below 70 INSTRUCTIONS: Work on a low fat, heart healthy diet and participate in regular aerobic exercise program by working out at least 150 minutes per week; 5 days a week-30 minutes per day. Avoid red meat/beef/steak,  fried foods. junk foods, sodas, sugary drinks, unhealthy snacking, alcohol  and smoking.  Drink at least 80 oz of water per day and monitor your carbohydrate intake daily.     Follow Up Instructions Return in about 3 months (around 05/15/2020).     I discussed the assessment and treatment plan with the patient. The patient was provided an opportunity to ask questions and all were answered. The patient agreed with the plan and demonstrated an understanding of the instructions.   The patient was advised to call back or seek an in-person evaluation if the symptoms worsen or if the condition fails to improve as anticipated.  I provided 12 minutes of non-face-to-face time during this encounter including median intraservice time, reviewing previous notes, labs, imaging, medications and explaining diagnosis and management.  Gildardo Pounds, FNP-BC

## 2020-02-23 ENCOUNTER — Ambulatory Visit: Payer: Medicare Other | Attending: Nurse Practitioner

## 2020-02-23 ENCOUNTER — Other Ambulatory Visit: Payer: Self-pay

## 2020-02-23 ENCOUNTER — Other Ambulatory Visit: Payer: Self-pay | Admitting: Nurse Practitioner

## 2020-02-23 DIAGNOSIS — I10 Essential (primary) hypertension: Secondary | ICD-10-CM | POA: Diagnosis not present

## 2020-02-23 DIAGNOSIS — E118 Type 2 diabetes mellitus with unspecified complications: Secondary | ICD-10-CM

## 2020-02-23 DIAGNOSIS — Z794 Long term (current) use of insulin: Secondary | ICD-10-CM

## 2020-02-23 MED ORDER — DAPAGLIFLOZIN PROPANEDIOL 5 MG PO TABS: 5.0000 mg | ORAL_TABLET | Freq: Every day | ORAL | 0 refills | Status: DC

## 2020-02-24 LAB — CMP14+EGFR
ALT: 24 IU/L (ref 0–44)
AST: 18 IU/L (ref 0–40)
Albumin/Globulin Ratio: 1.6 (ref 1.2–2.2)
Albumin: 4.3 g/dL (ref 3.8–4.8)
Alkaline Phosphatase: 88 IU/L (ref 44–121)
BUN/Creatinine Ratio: 14 (ref 10–24)
BUN: 10 mg/dL (ref 8–27)
Bilirubin Total: 0.3 mg/dL (ref 0.0–1.2)
CO2: 20 mmol/L (ref 20–29)
Calcium: 9.1 mg/dL (ref 8.6–10.2)
Chloride: 102 mmol/L (ref 96–106)
Creatinine, Ser: 0.73 mg/dL — ABNORMAL LOW (ref 0.76–1.27)
GFR calc Af Amer: 109 mL/min/{1.73_m2} (ref >59)
GFR calc non Af Amer: 95 mL/min/{1.73_m2} (ref >59)
Globulin, Total: 2.7 g/dL (ref 1.5–4.5)
Glucose: 216 mg/dL — ABNORMAL HIGH (ref 65–99)
Potassium: 4.3 mmol/L (ref 3.5–5.2)
Sodium: 142 mmol/L (ref 134–144)
Total Protein: 7 g/dL (ref 6.0–8.5)

## 2020-02-24 LAB — HEMOGLOBIN A1C
Est. average glucose Bld gHb Est-mCnc: 148 mg/dL
Hgb A1c MFr Bld: 6.8 % — ABNORMAL HIGH (ref 4.8–5.6)

## 2020-02-28 DIAGNOSIS — H25813 Combined forms of age-related cataract, bilateral: Secondary | ICD-10-CM | POA: Diagnosis not present

## 2020-02-28 DIAGNOSIS — Z794 Long term (current) use of insulin: Secondary | ICD-10-CM | POA: Diagnosis not present

## 2020-02-28 DIAGNOSIS — Z7984 Long term (current) use of oral hypoglycemic drugs: Secondary | ICD-10-CM | POA: Diagnosis not present

## 2020-02-28 DIAGNOSIS — E1136 Type 2 diabetes mellitus with diabetic cataract: Secondary | ICD-10-CM | POA: Diagnosis not present

## 2020-02-28 DIAGNOSIS — H538 Other visual disturbances: Secondary | ICD-10-CM | POA: Diagnosis not present

## 2020-02-28 LAB — HM DIABETES EYE EXAM

## 2020-03-21 ENCOUNTER — Other Ambulatory Visit: Payer: Self-pay | Admitting: Nurse Practitioner

## 2020-03-21 DIAGNOSIS — E785 Hyperlipidemia, unspecified: Secondary | ICD-10-CM

## 2020-03-21 DIAGNOSIS — I10 Essential (primary) hypertension: Secondary | ICD-10-CM

## 2020-03-21 NOTE — Telephone Encounter (Signed)
Requested Prescriptions  Pending Prescriptions Disp Refills  . pravastatin (PRAVACHOL) 20 MG tablet [Pharmacy Med Name: Pravastatin Sodium 20 MG Oral Tablet] 90 tablet 0    Sig: Take 1 tablet by mouth once daily     Cardiovascular:  Antilipid - Statins Failed - 03/21/2020  7:12 PM      Failed - LDL in normal range and within 360 days    LDL Chol Calc (NIH)  Date Value Ref Range Status  05/11/2019 82 0 - 99 mg/dL Final         Passed - Total Cholesterol in normal range and within 360 days    Cholesterol, Total  Date Value Ref Range Status  05/11/2019 143 100 - 199 mg/dL Final         Passed - HDL in normal range and within 360 days    HDL  Date Value Ref Range Status  05/11/2019 46 >39 mg/dL Final         Passed - Triglycerides in normal range and within 360 days    Triglycerides  Date Value Ref Range Status  05/11/2019 75 0 - 149 mg/dL Final         Passed - Patient is not pregnant      Passed - Valid encounter within last 12 months    Recent Outpatient Visits          1 month ago Controlled type 2 diabetes mellitus with complication, with long-term current use of insulin ([place])   [place], [place] W, NP   1 month ago Controlled type 2 diabetes mellitus with complication, with long-term current use of insulin ([place])   [place], Annie Main L, RPH-CPP   2 months ago Controlled type 2 diabetes mellitus with complication, with long-term current use of insulin [facility])   [place], Jarome Matin, RPH-CPP   4 months ago Controlled type 2 diabetes mellitus with complication, with long-term current use of insulin [facility])   [place], Vernia Buff, NP   7 months ago Essential hypertension   [place], Vernia Buff, NP      Future Appointments            In 1 month Gildardo Pounds, NP [place]   In 11 months Doren Custard, Melton Krebs, NP [facility]           . losartan (COZAAR) 100 MG tablet [Pharmacy Med Name: Losartan Potassium 100 MG Oral Tablet] 90 tablet 0    Sig: Take 1 tablet by mouth once daily     Cardiovascular:  Angiotensin Receptor Blockers Failed - 03/21/2020  7:12 PM      Failed - Cr in normal range and within 180 days    Creat  Date Value Ref Range Status  08/09/2019 0.77 0.70 - 1.25 mg/dL Final    Comment:    For patients >72 years of age, the reference limit for Creatinine is approximately 13% higher for people identified as African-American. .    Creatinine, Ser  Date Value Ref Range Status  02/23/2020 0.73 (L) 0.76 - 1.27 mg/dL Final         Passed - K in normal range and within 180 days    Potassium  Date Value Ref Range Status  02/23/2020 4.3 3.5 - 5.2 mmol/L  Final         Passed - Patient is not pregnant      Passed - Last BP in normal range    BP Readings from Last 1 Encounters:  12/27/19 123/84         Passed - Valid encounter within last 6 months    Recent Outpatient Visits          1 month ago Controlled type 2 diabetes mellitus with complication, with long-term current use of insulin [facility])   [place], [place] W, NP   1 month ago Controlled type 2 diabetes mellitus with complication, with long-term current use of insulin [facility])   [place], Annie Main L, RPH-CPP   2 months ago Controlled type 2 diabetes mellitus with complication, with long-term current use of insulin [facility])   [place], Jarome Matin, RPH-CPP   4 months ago Controlled type 2 diabetes mellitus with complication, with long-term current use of insulin [facility])   [place], Vernia Buff, NP   7 months ago Essential hypertension   [place], Vernia Buff, NP      Future Appointments            In 1 month Gildardo Pounds, NP [place]   In 11 months Doren Custard, Melton Krebs, NP [facility]

## 2020-03-29 ENCOUNTER — Other Ambulatory Visit: Payer: Self-pay | Admitting: Nurse Practitioner

## 2020-03-29 DIAGNOSIS — E785 Hyperlipidemia, unspecified: Secondary | ICD-10-CM

## 2020-04-13 ENCOUNTER — Other Ambulatory Visit (HOSPITAL_COMMUNITY): Payer: Self-pay

## 2020-04-13 DIAGNOSIS — H524 Presbyopia: Secondary | ICD-10-CM | POA: Diagnosis not present

## 2020-05-02 ENCOUNTER — Ambulatory Visit: Payer: Medicare Other | Attending: Nurse Practitioner | Admitting: Nurse Practitioner

## 2020-05-02 ENCOUNTER — Other Ambulatory Visit: Payer: Self-pay

## 2020-05-02 ENCOUNTER — Encounter: Payer: Self-pay | Admitting: Nurse Practitioner

## 2020-05-02 DIAGNOSIS — Z794 Long term (current) use of insulin: Secondary | ICD-10-CM

## 2020-05-02 DIAGNOSIS — E118 Type 2 diabetes mellitus with unspecified complications: Secondary | ICD-10-CM

## 2020-05-02 DIAGNOSIS — G44329 Chronic post-traumatic headache, not intractable: Secondary | ICD-10-CM

## 2020-05-02 DIAGNOSIS — I10 Essential (primary) hypertension: Secondary | ICD-10-CM | POA: Diagnosis not present

## 2020-05-02 MED ORDER — LEVEMIR FLEXTOUCH 100 UNIT/ML ~~LOC~~ SOPN: 5.0000 [IU] | PEN_INJECTOR | Freq: Every day | SUBCUTANEOUS | 6 refills | Status: DC

## 2020-05-02 MED ORDER — NORTRIPTYLINE HCL 50 MG PO CAPS: 100.0000 mg | ORAL_CAPSULE | Freq: Every day | ORAL | 3 refills | Status: DC

## 2020-05-02 MED ORDER — INSULIN PEN NEEDLE 31G X 5 MM MISC: 12 refills | Status: DC

## 2020-05-02 NOTE — Progress Notes (Signed)
Virtual Visit via Telephone Note Due to national recommendations of social distancing due to [place] 19, telehealth visit is felt to be most appropriate for this patient at this time.  I discussed the limitations, risks, security and privacy concerns of performing an evaluation and management service by telephone and the availability of in person appointments. I also discussed with the patient that there may be a patient responsible charge related to this service. The patient expressed understanding and agreed to proceed.    I connected with Phillip Paul on 05/02/20  at  10:50 AM EDT  EDT by telephone and verified that I am speaking with the correct person using two identifiers.   Consent I discussed the limitations, risks, security and privacy concerns of performing an evaluation and management service by telephone and the availability of in person appointments. I also discussed with the patient that there may be a patient responsible charge related to this service. The patient expressed understanding and agreed to proceed.   Location of Patient: Private Residence    Location of Provider: [place] [facility] [place] participating in Telemedicine visit: Geryl Rankins FNP-BC [place]    History of Present Illness: Telemedicine visit for: Follow up He has a past medical history of  Anxiety, DM2, Vertigo, Hypertension, Kidney stones, and Racing heart beat.  He had major dental surgery a few weeks ago. Since then headaches have returned and worsened. He had stopped taking pamelor nightly for his headaches as it was causing drowsiness alongside taking trazodone. I have instructed him to stop trazodone which he is only taking prn and resume pamelor nightly for his headaches.   Essential Hypertension Endorsing elevated home BP readings; BP today 145/88. He is still experiencing dental pain which he needs to follow up with the dentist  for. Currently taking amlodipine 10 mg daily and losartan 100 mg daily as prescribed. Denies chest pain,  palpitations, lightheadedness, dizziness, or BLE edema. He does endorse shortness of breath with increased activity.  BP Readings from Last 3 Encounters:  12/27/19 123/84  11/16/19 (!) 146/80  06/22/19 138/89    DM 2  Reports blood glucose levels increased with average readings:140-200s. However he is not taking levemir 5units nightly (states it was not refilled and he assumed he had been taken off of it). He is only taking farxiga 5mg  and metformin 500 mg at this time. Will have him restart levemir and he will update me on his readings via mychart next week.  Lab Results  Component Value Date   HGBA1C 6.8 (H) 02/23/2020   Lab Results  Component Value Date   LDLCALC 82 05/11/2019    Past Medical History:  Diagnosis Date  . Abnormal EKG   . Anxiety   . Diabetes mellitus without complication ([place])   . Dizzy   . Hypertension   . Kidney stones   . Racing heart beat     Past Surgical History:  Procedure Laterality Date  . CYSTOSCOPY     LEFT URETEROSCOPIC STONE MANIPULATION AND REMOVAL; PLACEMENT OF  LEFT DOUBLE-J URETERAL STENT  . CYSTOSCOPY W/ URETERAL STENT PLACEMENT     LEFT DOUBLE-J URETERAL STENT  . ORIF ANKLE FRACTURE Left 08/17/2012   Procedure: OPEN REDUCTION INTERNAL FIXATION (ORIF) ANKLE FRACTURE/Left;  Surgeon: Wylene Simmer, MD;  Location: [facility];  Service: Orthopedics;  Laterality: Left;  . TIBIA IM NAIL INSERTION Left 08/17/2012   Procedure: INTRAMEDULLARY (IM) NAIL TIBIAL/Left;  Surgeon: Jenny Reichmann  Doran Durand, MD;  Location: [place];  Service: Orthopedics;  Laterality: Left;    Family History  Problem Relation Age of Onset  . Hypertension Father        lived to 71  . Other Brother        MVA  . Kidney disease Sister   . Other Brother        Stomach problems    Social History   Socioeconomic History  . Marital status: Significant Other    Spouse name: Not on file  .  Number of children: 1  . Years of education: Not on file  . Highest education level: Bachelor's degree (e.g., BA, AB, BS)  Occupational History  . Occupation: retired  Tobacco Use  . Smoking status: Former Smoker    Packs/day: 0.25  . Smokeless tobacco: Never Used  . Tobacco comment: 1 pk/week  Vaping Use  . Vaping Use: Never used  Substance and Sexual Activity  . Alcohol use: Not Currently    Comment: Pt. stated he have not drank in 4 years.   . Drug use: No  . Sexual activity: Yes  Other Topics Concern  . Not on file  Social History Narrative   NO FAMILY HX TO REPORT FROM NEW RECORDS      Patient is right-handed. He lives with his girlfriend. He drinks 2-3 cups or tea a day. He does not exercise.   Social Determinants of Health   Financial Resource Strain: Not on file  Food Insecurity: Not on file  Transportation Needs: Not on file  Physical Activity: Not on file  Stress: Not on file  Social Connections: Not on file     Observations/Objective: Awake, alert and oriented x 3   Review of Systems  Constitutional: Negative for fever, malaise/fatigue and weight loss.  HENT: Negative.  Negative for nosebleeds.   Eyes: Negative.  Negative for blurred vision, double vision and photophobia.  Respiratory: Positive for shortness of breath. Negative for cough.   Cardiovascular: Negative.  Negative for chest pain, palpitations and leg swelling.  Gastrointestinal: Negative.  Negative for heartburn, nausea and vomiting.  Musculoskeletal: Negative.  Negative for myalgias.  Neurological: Positive for headaches. Negative for dizziness, focal weakness and seizures.  Psychiatric/Behavioral: Negative.  Negative for suicidal ideas.    Assessment and Plan: Diagnoses and all orders for this visit:  Controlled type 2 diabetes mellitus with complication, with long-term current use of insulin ([facility]) -     insulin detemir (LEVEMIR FLEXTOUCH) 100 UNIT/ML FlexPen; Inject 5 Units into the skin  at bedtime. -     Insulin Pen Needle 31G X 5 MM MISC; Use as instructed. Continue blood sugar control as discussed in office today, low carbohydrate diet, and regular physical exercise as tolerated, 150 minutes per week (30 min each day, 5 days per week, or 50 min 3 days per week). Keep blood sugar logs with fasting goal of 90-130 mg/dl, post prandial (after you eat) less than 180.  For Hypoglycemia: BS <60 and Hyperglycemia BS >400; contact the clinic ASAP. Annual eye exams and foot exams are recommended.   Essential hypertension Continue all antihypertensives as prescribed.  Remember to bring in your blood pressure log with you for your follow up appointment.  DASH/Mediterranean Diets are healthier choices for HTN.    Chronic post-traumatic headache, not intractable -     nortriptyline (PAMELOR) 50 MG capsule; Take 2 capsules (100 mg total) by mouth at bedtime.     Follow Up Instructions Return  in about 2 months (around 07/02/2020).     I discussed the assessment and treatment plan with the patient. The patient was provided an opportunity to ask questions and all were answered. The patient agreed with the plan and demonstrated an understanding of the instructions.   The patient was advised to call back or seek an in-person evaluation if the symptoms worsen or if the condition fails to improve as anticipated.  I provided 19 minutes of non-face-to-face time during this encounter including median intraservice time, reviewing previous notes, labs, imaging, medications and explaining diagnosis and management.  Gildardo Pounds, FNP-BC

## 2020-05-15 ENCOUNTER — Ambulatory Visit: Payer: Medicaid Other | Admitting: Nurse Practitioner

## 2020-05-19 ENCOUNTER — Other Ambulatory Visit: Payer: Self-pay | Admitting: Nurse Practitioner

## 2020-05-19 DIAGNOSIS — I10 Essential (primary) hypertension: Secondary | ICD-10-CM

## 2020-06-08 ENCOUNTER — Encounter: Payer: Self-pay | Admitting: Nurse Practitioner

## 2020-06-09 ENCOUNTER — Other Ambulatory Visit: Payer: Self-pay | Admitting: Nurse Practitioner

## 2020-06-09 DIAGNOSIS — Z794 Long term (current) use of insulin: Secondary | ICD-10-CM

## 2020-06-09 DIAGNOSIS — E785 Hyperlipidemia, unspecified: Secondary | ICD-10-CM

## 2020-06-09 DIAGNOSIS — D72818 Other decreased white blood cell count: Secondary | ICD-10-CM

## 2020-06-09 DIAGNOSIS — I10 Essential (primary) hypertension: Secondary | ICD-10-CM

## 2020-06-09 MED ORDER — DAPAGLIFLOZIN PROPANEDIOL 10 MG PO TABS: 10.0000 mg | ORAL_TABLET | Freq: Every day | ORAL | 0 refills | Status: DC

## 2020-06-14 ENCOUNTER — Other Ambulatory Visit: Payer: Self-pay

## 2020-06-14 ENCOUNTER — Ambulatory Visit: Payer: Medicare Other | Attending: Family Medicine

## 2020-06-14 DIAGNOSIS — Z794 Long term (current) use of insulin: Secondary | ICD-10-CM | POA: Diagnosis not present

## 2020-06-14 DIAGNOSIS — E118 Type 2 diabetes mellitus with unspecified complications: Secondary | ICD-10-CM | POA: Diagnosis not present

## 2020-06-14 DIAGNOSIS — D72818 Other decreased white blood cell count: Secondary | ICD-10-CM

## 2020-06-14 DIAGNOSIS — E785 Hyperlipidemia, unspecified: Secondary | ICD-10-CM

## 2020-06-14 DIAGNOSIS — I10 Essential (primary) hypertension: Secondary | ICD-10-CM

## 2020-06-15 LAB — CMP14+EGFR
ALT: 40 IU/L (ref 0–44)
AST: 24 IU/L (ref 0–40)
Albumin/Globulin Ratio: 1.4 (ref 1.2–2.2)
Albumin: 4.4 g/dL (ref 3.8–4.8)
Alkaline Phosphatase: 104 IU/L (ref 44–121)
BUN/Creatinine Ratio: 24 (ref 10–24)
BUN: 17 mg/dL (ref 8–27)
Bilirubin Total: 0.3 mg/dL (ref 0.0–1.2)
CO2: 24 mmol/L (ref 20–29)
Calcium: 9.4 mg/dL (ref 8.6–10.2)
Chloride: 98 mmol/L (ref 96–106)
Creatinine, Ser: 0.7 mg/dL — ABNORMAL LOW (ref 0.76–1.27)
Globulin, Total: 3.1 g/dL (ref 1.5–4.5)
Glucose: 154 mg/dL — ABNORMAL HIGH (ref 65–99)
Potassium: 4.1 mmol/L (ref 3.5–5.2)
Sodium: 139 mmol/L (ref 134–144)
Total Protein: 7.5 g/dL (ref 6.0–8.5)
eGFR: 100 mL/min/{1.73_m2} (ref >59)

## 2020-06-15 LAB — LIPID PANEL
Chol/HDL Ratio: 4.4 ratio (ref 0.0–5.0)
Cholesterol, Total: 167 mg/dL (ref 100–199)
HDL: 38 mg/dL — ABNORMAL LOW (ref >39)
LDL Chol Calc (NIH): 90 mg/dL (ref 0–99)
Triglycerides: 232 mg/dL — ABNORMAL HIGH (ref 0–149)
VLDL Cholesterol Cal: 39 mg/dL (ref 5–40)

## 2020-06-15 LAB — CBC
Hematocrit: 45.1 % (ref 37.5–51.0)
Hemoglobin: 14.7 g/dL (ref 13.0–17.7)
MCH: 29 pg (ref 26.6–33.0)
MCHC: 32.6 g/dL (ref 31.5–35.7)
MCV: 89 fL (ref 79–97)
Platelets: 233 10*3/uL (ref 150–450)
RBC: 5.07 x10E6/uL (ref 4.14–5.80)
RDW: 13.1 % (ref 11.6–15.4)
WBC: 7.1 10*3/uL (ref 3.4–10.8)

## 2020-06-15 LAB — HEMOGLOBIN A1C
Est. average glucose Bld gHb Est-mCnc: 180 mg/dL
Hgb A1c MFr Bld: 7.9 % — ABNORMAL HIGH (ref 4.8–5.6)

## 2020-06-20 ENCOUNTER — Encounter: Payer: Self-pay | Admitting: Nurse Practitioner

## 2020-07-03 ENCOUNTER — Ambulatory Visit (HOSPITAL_COMMUNITY): Payer: Medicaid Other

## 2020-07-11 ENCOUNTER — Encounter: Payer: Self-pay | Admitting: Nurse Practitioner

## 2020-07-12 ENCOUNTER — Other Ambulatory Visit: Payer: Self-pay

## 2020-07-12 ENCOUNTER — Ambulatory Visit: Payer: Medicare Other | Attending: Nurse Practitioner | Admitting: Nurse Practitioner

## 2020-07-12 ENCOUNTER — Encounter: Payer: Self-pay | Admitting: Nurse Practitioner

## 2020-07-12 DIAGNOSIS — Z794 Long term (current) use of insulin: Secondary | ICD-10-CM

## 2020-07-12 DIAGNOSIS — E785 Hyperlipidemia, unspecified: Secondary | ICD-10-CM | POA: Diagnosis not present

## 2020-07-12 DIAGNOSIS — E118 Type 2 diabetes mellitus with unspecified complications: Secondary | ICD-10-CM

## 2020-07-12 DIAGNOSIS — R222 Localized swelling, mass and lump, trunk: Secondary | ICD-10-CM | POA: Diagnosis not present

## 2020-07-12 MED ORDER — PRAVASTATIN SODIUM 20 MG PO TABS: 20.0000 mg | ORAL_TABLET | Freq: Every day | ORAL | 3 refills | Status: DC

## 2020-07-12 MED ORDER — METFORMIN HCL 500 MG PO TABS: 500.0000 mg | ORAL_TABLET | Freq: Two times a day (BID) | ORAL | 1 refills | Status: DC

## 2020-07-12 NOTE — Progress Notes (Signed)
Virtual Visit via Telephone Note Due to national recommendations of social distancing due to [place] 19, telehealth visit is felt to be most appropriate for this patient at this time.  I discussed the limitations, risks, security and privacy concerns of performing an evaluation and management service by telephone and the availability of in person appointments. I also discussed with the patient that there may be a patient responsible charge related to this service. The patient expressed understanding and agreed to proceed.    I connected with Phillip Paul on 07/12/20  at   3:30 PM EDT  EDT by telephone and verified that I am speaking with the correct person using two identifiers.  Location of Patient: Private Residence   Location of Provider: [place] participating in Telemedicine visit: Geryl Rankins FNP-BC [place]    History of Present Illness: Telemedicine visit for: Rectal problem  States there is a painless lump in between his buttocks that he needs me to look at. Feels it is growing. It is not attached to his rectum like a hemorrhoid. He had a friend look at the area and she told him it looked like a skin tag. Denies constipation or hemorrhoids. There is no rectal bleeding, hematochezia or melena. I will try to get him into the mobile unit for evaluation of this as I do not have any openings for a few months.    DM 2 Fasting average 120-130. Post prandial averages: 130-150. Taking metformin 500 mg BID and Levemir 5units daily.  Lab Results  Component Value Date   HGBA1C 7.9 (H) 06/14/2020      Past Medical History:  Diagnosis Date   Abnormal EKG    Anxiety    Diabetes mellitus without complication (HCC)    Dizzy    Hypertension    Kidney stones    Racing heart beat     Past Surgical History:  Procedure Laterality Date   CYSTOSCOPY     LEFT URETEROSCOPIC STONE MANIPULATION AND REMOVAL; PLACEMENT OF   LEFT DOUBLE-J URETERAL STENT   CYSTOSCOPY W/ URETERAL STENT PLACEMENT     LEFT DOUBLE-J URETERAL STENT   ORIF ANKLE FRACTURE Left 08/17/2012   Procedure: OPEN REDUCTION INTERNAL FIXATION (ORIF) ANKLE FRACTURE/Left;  Surgeon: Wylene Simmer, MD;  Location: [place];  Service: Orthopedics;  Laterality: Left;   TIBIA IM NAIL INSERTION Left 08/17/2012   Procedure: INTRAMEDULLARY (IM) NAIL TIBIAL/Left;  Surgeon: Wylene Simmer, MD;  Location: [facility];  Service: Orthopedics;  Laterality: Left;    Family History  Problem Relation Age of Onset   Hypertension Father        lived to 76   Other Brother        MVA   Kidney disease Sister    Other Brother        Stomach problems    Social History   Socioeconomic History   Marital status: Significant Other    Spouse name: Not on file   Number of children: 1   Years of education: Not on file   Highest education level: Bachelor's degree (e.g., BA, AB, BS)  Occupational History   Occupation: retired  Tobacco Use   Smoking status: Former    Packs/day: 0.25    Pack years: 0.00    Types: Cigarettes   Smokeless tobacco: Never   Tobacco comments:    1 pk/week  Vaping Use   Vaping Use: Never used  Substance and Sexual Activity  Alcohol use: Not Currently    Comment: Pt. stated he have not drank in 4 years.    Drug use: No   Sexual activity: Yes  Other Topics Concern   Not on file  Social History Narrative   NO FAMILY HX TO REPORT FROM NEW RECORDS      Patient is right-handed. He lives with his girlfriend. He drinks 2-3 cups or tea a day. He does not exercise.   Social Determinants of Health   Financial Resource Strain: Not on file  Food Insecurity: Not on file  Transportation Needs: Not on file  Physical Activity: Not on file  Stress: Not on file  Social Connections: Not on file     Observations/Objective: Awake, alert and oriented x 3   Review of Systems  Constitutional:  Negative for fever, malaise/fatigue and weight loss.  HENT:  Negative.  Negative for nosebleeds.   Eyes: Negative.  Negative for blurred vision, double vision and photophobia.  Respiratory: Negative.  Negative for cough and shortness of breath.   Cardiovascular: Negative.  Negative for chest pain, palpitations and leg swelling.  Gastrointestinal: Negative.  Negative for heartburn, nausea and vomiting.  Genitourinary:        SEE HPI  Musculoskeletal: Negative.  Negative for myalgias.  Neurological: Negative.  Negative for dizziness, focal weakness, seizures and headaches.  Psychiatric/Behavioral: Negative.  Negative for suicidal ideas.    Assessment and Plan: Diagnoses and all orders for this visit:  Buttocks nodule Instructed patient to call mobile clinic for evaluation and possible surgical referral   Dyslipidemia, goal LDL below 70 -     pravastatin (PRAVACHOL) 20 MG tablet; Take 1 tablet (20 mg total) by mouth daily. INSTRUCTIONS: Work on a low fat, heart healthy diet and participate in regular aerobic exercise program by working out at least 150 minutes per week; 5 days a week-30 minutes per day. Avoid red meat/beef/steak,  fried foods. junk foods, sodas, sugary drinks, unhealthy snacking, alcohol and smoking.  Drink at least 80 oz of water per day and monitor your carbohydrate intake daily.    Controlled type 2 diabetes mellitus with complication, with long-term current use of insulin (HCC) -     metFORMIN (GLUCOPHAGE) 500 MG tablet; Take 1 tablet (500 mg total) by mouth 2 (two) times daily with a meal.  Continue blood sugar control as discussed in office today, low carbohydrate diet, and regular physical exercise as tolerated, 150 minutes per week (30 min each day, 5 days per week, or 50 min 3 days per week). Keep blood sugar logs with fasting goal of 90-130 mg/dl, post prandial (after you eat) less than 180.  For Hypoglycemia: BS <60 and Hyperglycemia BS >400; contact the clinic ASAP. Annual eye exams and foot exams are recommended.   Follow  Up Instructions Return in about 3 months (around 10/12/2020).     I discussed the assessment and treatment plan with the patient. The patient was provided an opportunity to ask questions and all were answered. The patient agreed with the plan and demonstrated an understanding of the instructions.   The patient was advised to call back or seek an in-person evaluation if the symptoms worsen or if the condition fails to improve as anticipated.  I provided 18 minutes of non-face-to-face time during this encounter including median intraservice time, reviewing previous notes, labs, imaging, medications and explaining diagnosis and management.  Gildardo Pounds, FNP-BC

## 2020-07-14 ENCOUNTER — Other Ambulatory Visit: Payer: Self-pay | Admitting: Nurse Practitioner

## 2020-07-14 DIAGNOSIS — I10 Essential (primary) hypertension: Secondary | ICD-10-CM

## 2020-07-25 ENCOUNTER — Encounter: Payer: Self-pay | Admitting: Physician Assistant

## 2020-07-25 ENCOUNTER — Other Ambulatory Visit: Payer: Self-pay

## 2020-07-25 ENCOUNTER — Ambulatory Visit: Payer: Medicare Other | Admitting: Physician Assistant

## 2020-07-25 VITALS — BP 146/88 | HR 90 | Temp 98.4°F | Resp 18 | Ht 71.0 in | Wt 203.0 lb

## 2020-07-25 DIAGNOSIS — R222 Localized swelling, mass and lump, trunk: Secondary | ICD-10-CM

## 2020-07-25 NOTE — Progress Notes (Signed)
Patient complains of noticing a mass a few years ago on the right side of his rectum.  Patient shares over the past 2 months the area has gotten larger and is only tender with touching. Patient took medication around 6:30 am and has eaten today.

## 2020-07-25 NOTE — Patient Instructions (Signed)
Skin Tag, Adult  A skin tag (acrochordon) is a soft, extra growth of skin. Most skin tags are skin-colored and rarely bigger than a pencil eraser. They commonly form in areas where there is frequent rubbing, or friction, on the skin. This may be where there are folds in the skin, such as the eyelids, neck, armpit, or groin. Skin tags are not dangerous, and they do not spread from person to person (are not contagious). You may have one skin tag or several. Skin tags do not require treatment. However, your health care provider may recommend removal of a skin tag if it: Gets irritated from clothing or jewelry. Bleeds. Is visible and unsightly. What are the causes? This condition is linked with: Increasing age. Pregnancy. Diabetes. Obesity. What are the signs or symptoms? Skin tags usually do not cause symptoms unless they get irritated by items touching your skin, such as clothing or jewelry. When this happens, you may have pain, itching, or bleeding. How is this diagnosed? This condition is diagnosed with an evaluation from your health care provider. No testing is needed for diagnosis. How is this treated? Treatment for this condition depends on whether you have symptoms. If a skin tag needs to be removed, your health care provider can remove it with: A simple surgical procedure using scissors. A procedure that involves freezing your skin tag with a gas in liquid form (liquid nitrogen). A procedure that uses heat to destroy your skin tag (electrodessication). Your health care provider may also remove your skin tag if it is visible or unsightly, Follow these instructions at home: Watch for any changes in your skin tag. A normal skin tag does not require any other special care at home. Take over-the-counter and prescription medicines only as told by your health care provider. Keep all follow-up visits as told by your health care provider. This is important. Contact a health care provider  if: You have a skin tag that: Becomes painful. Changes color. Bleeds. Swells. Summary Skin tags are soft, extra growths of skin found in areas of frequent rubbing or friction. Skin tags usually do not cause symptoms. If symptoms occur, you may have pain, itching, or bleeding. If your skin tag causes symptoms or is unsightly, your health care provider can remove it. This information is not intended to replace advice given to you by your health care provider. Make sure you discuss any questions you have with your health care provider. Document Revised: 11/02/2018 Document Reviewed: 11/02/2018 Elsevier Patient Education  2022 Elsevier Inc.  

## 2020-07-25 NOTE — Progress Notes (Signed)
Established Patient Office Visit  Subjective:  Patient ID: Phillip Paul, male    DOB: November 29, 1950  Age: 70 y.o. MRN: [id number]  CC:  Chief Complaint  Patient presents with   Skin Tag     HPI Phillip Paul reports that he has had a painless nodule inbetween his two buttocks for the past couple of years.  States that he feels it is "getting bigger and may have moved." Reports that he feels it has been getting bigger for the past 3 months.  Denies bleeding.  Has not tried anything for relief.   Past Medical History:  Diagnosis Date   Abnormal EKG    Anxiety    Diabetes mellitus without complication (HCC)    Dizzy    Hypertension    Kidney stones    Racing heart beat     Past Surgical History:  Procedure Laterality Date   CYSTOSCOPY     LEFT URETEROSCOPIC STONE MANIPULATION AND REMOVAL; PLACEMENT OF  LEFT DOUBLE-J URETERAL STENT   CYSTOSCOPY W/ URETERAL STENT PLACEMENT     LEFT DOUBLE-J URETERAL STENT   ORIF ANKLE FRACTURE Left 08/17/2012   Procedure: OPEN REDUCTION INTERNAL FIXATION (ORIF) ANKLE FRACTURE/Left;  Surgeon: Wylene Simmer, MD;  Location: [facility];  Service: Orthopedics;  Laterality: Left;   TIBIA IM NAIL INSERTION Left 08/17/2012   Procedure: INTRAMEDULLARY (IM) NAIL TIBIAL/Left;  Surgeon: Wylene Simmer, MD;  Location: [facility];  Service: Orthopedics;  Laterality: Left;    Family History  Problem Relation Age of Onset   Hypertension Father        lived to 17   Other Brother        MVA   Kidney disease Sister    Other Brother        Stomach problems    Social History   Socioeconomic History   Marital status: Significant Other    Spouse name: Not on file   Number of children: 1   Years of education: Not on file   Highest education level: Bachelor's degree (e.g., BA, AB, BS)  Occupational History   Occupation: retired  Tobacco Use   Smoking status: Former    Packs/day: 0.25    Pack years: 0.00    Types: Cigarettes   Smokeless tobacco:  Never   Tobacco comments:    1 pk/week  Vaping Use   Vaping Use: Never used  Substance and Sexual Activity   Alcohol use: Not Currently    Comment: Pt. stated he have not drank in 4 years.    Drug use: No   Sexual activity: Yes  Other Topics Concern   Not on file  Social History Narrative   NO FAMILY HX TO REPORT FROM NEW RECORDS      Patient is right-handed. He lives with his girlfriend. He drinks 2-3 cups or tea a day. He does not exercise.   Social Determinants of Health   Financial Resource Strain: Not on file  Food Insecurity: Not on file  Transportation Needs: Not on file  Physical Activity: Not on file  Stress: Not on file  Social Connections: Not on file  Intimate Partner Violence: Not on file    Outpatient Medications Prior to Visit  Medication Sig Dispense Refill   amLODipine (NORVASC) 10 MG tablet Take 1 tablet by mouth once daily 90 tablet 0   Blood Glucose Monitoring Suppl (ONE TOUCH ULTRA 2) w/Device KIT Check blood sugar by fingerstick 2x a day. ICD10 E11.8 and E11.65 1 each  0   cholecalciferol (VITAMIN D) 1000 units tablet Take 1,000 Units by mouth daily.     dapagliflozin propanediol (FARXIGA) 10 MG TABS tablet Take 1 tablet (10 mg total) by mouth daily before breakfast. 90 tablet 0   fish oil-omega-3 fatty acids 1000 MG capsule Take 1 g by mouth daily.     fluticasone (FLONASE) 50 MCG/ACT nasal spray PLACE 2 SPRAYS INTO BOTH NOSTRILS DAILY. 16 g 2   Glecaprevir-Pibrentasvir (MAVYRET) 100-40 MG TABS Take 3 tablets by mouth daily with breakfast. 84 tablet 1   glucose blood (ONETOUCH ULTRA) test strip Use as instructed. Check blood glucose level by fingerstick twice per day.  E11.65 200 each 3   insulin detemir (LEVEMIR FLEXTOUCH) 100 UNIT/ML FlexPen Inject 5 Units into the skin at bedtime. 9 mL 6   Insulin Pen Needle 31G X 5 MM MISC Use as instructed. 100 each 12   losartan (COZAAR) 100 MG tablet Take 1 tablet by mouth once daily 90 tablet 0   metFORMIN  (GLUCOPHAGE) 500 MG tablet Take 1 tablet (500 mg total) by mouth 2 (two) times daily with a meal. 180 tablet 1   NON FORMULARY [facility]  Anti-fungal (nail)-#1     nortriptyline (PAMELOR) 50 MG capsule Take 2 capsules (100 mg total) by mouth at bedtime. 60 capsule 3   pravastatin (PRAVACHOL) 20 MG tablet Take 1 tablet (20 mg total) by mouth daily. 90 tablet 3   TRUEPLUS LANCETS 28G MISC Use as instructed 100 each 3   No facility-administered medications prior to visit.    Allergies  Allergen Reactions   Tylenol [Acetaminophen] Other (See Comments)    ELEVATED LFTs    ROS Review of Systems  Constitutional: Negative.   HENT: Negative.    Eyes: Negative.   Respiratory:  Negative for shortness of breath.   Cardiovascular:  Negative for chest pain.  Gastrointestinal:  Negative for anal bleeding and rectal pain.  Endocrine: Negative.   Genitourinary: Negative.   Musculoskeletal: Negative.   Skin: Negative.   Allergic/Immunologic: Negative.   Neurological: Negative.   Hematological: Negative.   Psychiatric/Behavioral: Negative.       Objective:    Physical Exam Vitals and nursing note reviewed.  Constitutional:      Appearance: Normal appearance.  HENT:     Head: Normocephalic and atraumatic.     Right Ear: External ear normal.     Left Ear: External ear normal.     Nose: Nose normal.     Mouth/Throat:     Mouth: Mucous membranes are moist.     Pharynx: Oropharynx is clear.  Eyes:     Extraocular Movements: Extraocular movements intact.     Conjunctiva/sclera: Conjunctivae normal.     Pupils: Pupils are equal, round, and reactive to light.  Cardiovascular:     Rate and Rhythm: Normal rate and regular rhythm.     Pulses: Normal pulses.     Heart sounds: Normal heart sounds.  Pulmonary:     Effort: Pulmonary effort is normal.     Breath sounds: Normal breath sounds.  Musculoskeletal:        General: Normal range of motion.     Cervical back: Normal range  of motion and neck supple.  Skin:    General: Skin is warm and dry.     Comments: Pedunculated nodule with wide stalk inner right buttock  Neurological:     General: No focal deficit present.     Mental Status: He is alert  and oriented to person, place, and time.  Psychiatric:        Mood and Affect: Mood normal.        Behavior: Behavior normal.        Thought Content: Thought content normal.        Judgment: Judgment normal.    BP (!) 146/88 (BP Location: Right Arm, Patient Position: Sitting, Cuff Size: Normal)   Pulse 90   Temp 98.4 F (36.9 C) (Oral)   Resp 18   Ht _0  (1.803 m)   Wt 203 lb (92.1 kg)   SpO2 93%   BMI 28.31 kg/m  Wt Readings from Last 3 Encounters:  07/25/20 203 lb (92.1 kg)  12/27/19 205 lb (93 kg)  11/16/19 200 lb 3.2 oz (90.8 kg)     Health Maintenance Due  Topic Date Due   TETANUS/TDAP  Never done   Zoster Vaccines- Shingrix (1 of 2) Never done   PNA vac Low Risk Adult (2 of 2 - PPSV23) 08/07/2018   COVID-19 Vaccine (4 - Booster for Pfizer series) 01/29/2020   FOOT EXAM  05/10/2020    There are no preventive care reminders to display for this patient.  Lab Results  Component Value Date   TSH 1.88 03/28/2017   Lab Results  Component Value Date   WBC 7.1 06/14/2020   HGB 14.7 06/14/2020   HCT 45.1 06/14/2020   MCV 89 06/14/2020   PLT 233 06/14/2020   Lab Results  Component Value Date   NA 139 06/14/2020   K 4.1 06/14/2020   CO2 24 06/14/2020   GLUCOSE 154 (H) 06/14/2020   BUN 17 06/14/2020   CREATININE 0.70 (L) 06/14/2020   BILITOT 0.3 06/14/2020   ALKPHOS 104 06/14/2020   AST 24 06/14/2020   ALT 40 06/14/2020   PROT 7.5 06/14/2020   ALBUMIN 4.4 06/14/2020   CALCIUM 9.4 06/14/2020   ANIONGAP 12 03/25/2017   EGFR 100 06/14/2020   GFR 115.83 03/28/2017   Lab Results  Component Value Date   CHOL 167 06/14/2020   Lab Results  Component Value Date   HDL 38 (L) 06/14/2020   Lab Results  Component Value Date    LDLCALC 90 06/14/2020   Lab Results  Component Value Date   TRIG 232 (H) 06/14/2020   Lab Results  Component Value Date   CHOLHDL 4.4 06/14/2020   Lab Results  Component Value Date   HGBA1C 7.9 (H) 06/14/2020      Assessment & Plan:   Problem List Items Addressed This Visit       Other   Nodule of buttock - Primary   Relevant Orders   Ambulatory referral to General Surgery    No orders of the defined types were placed in this encounter. 1. Nodule of buttock Nodule versus skin tag.  Stalk is much too wide for an office removal.  Refer to general surgery for further evaluation. - Ambulatory referral to General Surgery   I have reviewed the patient's medical history (PMH, PSH, Social History, Family History, Medications, and allergies) , and have been updated if relevant. I spent 21 minutes reviewing chart and  face to face time with patient.    Follow-up: Return if symptoms worsen or fail to improve.    Loraine Grip Mayers, PA-C

## 2020-08-01 ENCOUNTER — Ambulatory Visit (HOSPITAL_COMMUNITY)
Admission: RE | Admit: 2020-08-01 | Discharge: 2020-08-01 | Disposition: A | Discharge status: Home / Self Care | Payer: Medicare Other | Source: Ambulatory Visit | Attending: Infectious Diseases | Admitting: Infectious Diseases

## 2020-08-01 ENCOUNTER — Other Ambulatory Visit: Payer: Self-pay

## 2020-08-01 DIAGNOSIS — B199 Unspecified viral hepatitis without hepatic coma: Secondary | ICD-10-CM | POA: Diagnosis not present

## 2020-08-01 DIAGNOSIS — B182 Chronic viral hepatitis C: Secondary | ICD-10-CM | POA: Insufficient documentation

## 2020-08-01 DIAGNOSIS — K7460 Unspecified cirrhosis of liver: Secondary | ICD-10-CM | POA: Diagnosis not present

## 2020-08-02 ENCOUNTER — Other Ambulatory Visit: Payer: Self-pay | Admitting: Infectious Diseases

## 2020-08-02 DIAGNOSIS — B182 Chronic viral hepatitis C: Secondary | ICD-10-CM

## 2020-08-21 ENCOUNTER — Other Ambulatory Visit: Payer: Medicaid Other

## 2020-08-30 ENCOUNTER — Encounter: Payer: Self-pay | Admitting: Nurse Practitioner

## 2020-08-30 ENCOUNTER — Other Ambulatory Visit: Payer: Self-pay | Admitting: Nurse Practitioner

## 2020-09-04 ENCOUNTER — Ambulatory Visit: Payer: Medicaid Other | Admitting: Infectious Diseases

## 2020-09-13 ENCOUNTER — Other Ambulatory Visit: Payer: Self-pay | Admitting: Nurse Practitioner

## 2020-09-14 DIAGNOSIS — U071 COVID-19: Secondary | ICD-10-CM | POA: Diagnosis not present

## 2020-09-19 ENCOUNTER — Ambulatory Visit: Payer: Medicare Other | Attending: Nurse Practitioner | Admitting: Nurse Practitioner

## 2020-09-19 ENCOUNTER — Other Ambulatory Visit: Payer: Self-pay

## 2020-09-19 ENCOUNTER — Encounter: Payer: Self-pay | Admitting: Nurse Practitioner

## 2020-09-19 VITALS — BP 131/81 | HR 76 | Ht 71.0 in | Wt 199.1 lb

## 2020-09-19 DIAGNOSIS — Z794 Long term (current) use of insulin: Secondary | ICD-10-CM

## 2020-09-19 DIAGNOSIS — E785 Hyperlipidemia, unspecified: Secondary | ICD-10-CM | POA: Diagnosis not present

## 2020-09-19 DIAGNOSIS — Z8249 Family history of ischemic heart disease and other diseases of the circulatory system: Secondary | ICD-10-CM | POA: Diagnosis not present

## 2020-09-19 DIAGNOSIS — E118 Type 2 diabetes mellitus with unspecified complications: Secondary | ICD-10-CM | POA: Diagnosis not present

## 2020-09-19 DIAGNOSIS — Z79899 Other long term (current) drug therapy: Secondary | ICD-10-CM | POA: Diagnosis not present

## 2020-09-19 DIAGNOSIS — Z886 Allergy status to analgesic agent status: Secondary | ICD-10-CM | POA: Insufficient documentation

## 2020-09-19 DIAGNOSIS — Z7984 Long term (current) use of oral hypoglycemic drugs: Secondary | ICD-10-CM | POA: Diagnosis not present

## 2020-09-19 DIAGNOSIS — Z23 Encounter for immunization: Secondary | ICD-10-CM

## 2020-09-19 DIAGNOSIS — I10 Essential (primary) hypertension: Secondary | ICD-10-CM | POA: Diagnosis not present

## 2020-09-19 LAB — POCT GLYCOSYLATED HEMOGLOBIN (HGB A1C): Hemoglobin A1C: 7.9 % — AB (ref 4.0–5.6)

## 2020-09-19 LAB — GLUCOSE, POCT (MANUAL RESULT ENTRY): POC Glucose: 152 mg/dl — AB (ref 70–99)

## 2020-09-19 MED ORDER — INSULIN DETEMIR 100 UNIT/ML FLEXPEN
10.0000 [IU] | PEN_INJECTOR | Freq: Two times a day (BID) | SUBCUTANEOUS | 3 refills | Status: DC
Start: 2020-09-19 — End: 2021-05-25

## 2020-09-19 MED ORDER — ROSUVASTATIN CALCIUM 10 MG PO TABS: 10.0000 mg | ORAL_TABLET | Freq: Every day | ORAL | 0 refills | Status: DC

## 2020-09-19 NOTE — Progress Notes (Signed)
Assessment & Plan:  Phillip Paul was seen today for diabetes.  Diagnoses and all orders for this visit:  Controlled type 2 diabetes mellitus with complication, with long-term current use of insulin (HCC) -     POCT glucose (manual entry) -     POCT glycosylated hemoglobin (Hb A1C) -     insulin detemir (LEVEMIR) 100 UNIT/ML FlexPen; Inject 10 Units into the skin 2 (two) times daily. -     CMP14+EGFR Continue blood sugar control as discussed in office today, low carbohydrate diet, and regular physical exercise as tolerated, 150 minutes per week (30 min each day, 5 days per week, or 50 min 3 days per week). Keep blood sugar logs with fasting goal of 90-130 mg/dl, post prandial (after you eat) less than 180.  For Hypoglycemia: BS <60 and Hyperglycemia BS >400; contact the clinic ASAP. Annual eye exams and foot exams are recommended.   Dyslipidemia, goal LDL below 70 -     rosuvastatin (CRESTOR) 10 MG tablet; Take 1 tablet (10 mg total) by mouth daily. INSTRUCTIONS: Work on a low fat, heart healthy diet and participate in regular aerobic exercise program by working out at least 150 minutes per week; 5 days a week-30 minutes per day. Avoid red meat/beef/steak,  fried foods. junk foods, sodas, sugary drinks, unhealthy snacking, alcohol and smoking.  Drink at least 80 oz of water per day and monitor your carbohydrate intake daily.    Primary hypertension -     CMP14+EGFR Continue all antihypertensives as prescribed.  Remember to bring in your blood pressure log with you for your follow up appointment.  DASH/Mediterranean Diets are healthier choices for HTN.     Patient has been counseled on age-appropriate routine health concerns for screening and prevention. These are reviewed and up-to-date. Referrals have been placed accordingly. Immunizations are up-to-date or declined.    Subjective:   Chief Complaint  Patient presents with   Diabetes   Diabetes Pertinent negatives for  hypoglycemia include no dizziness, headaches or seizures. Pertinent negatives for diabetes include no blurred vision, no chest pain and no weight loss.  Phillip Paul 70 y.o. male presents to office today for follow up to DM, HTN and HPL.   DM 2 Not optimal. Will increase levemir to 10units nightly. Currently Taking farxiga 10 mg daily, levemir 5 units nightly, metformin 500 mg BID. Average readings 120-150s. Denies any symptoms of hypo or hyperglycemia. LDL not at goal. Will switch pravastatin to crestor today. He is doing well today. Walking 10 miles per day. Losing weight and trying to eat better.  Lab Results  Component Value Date   HGBA1C 7.9 (A) 09/19/2020    Lab Results  Component Value Date   HGBA1C 7.9 (H) 06/14/2020    Lab Results  Component Value Date   LDLCALC 90 06/14/2020     HTN Well controlled with amlodipne 10 mg daily, losartan 100 mg daily. Denies chest pain, shortness of breath, palpitations, lightheadedness, dizziness, headaches or BLE edema.   BP Readings from Last 3 Encounters:  09/19/20 131/81  07/25/20 (!) 146/88  12/27/19 123/84      Review of Systems  Constitutional:  Negative for fever, malaise/fatigue and weight loss.  HENT: Negative.  Negative for nosebleeds.   Eyes: Negative.  Negative for blurred vision, double vision and photophobia.  Respiratory: Negative.  Negative for cough and shortness of breath.   Cardiovascular: Negative.  Negative for chest pain, palpitations and leg swelling.  Gastrointestinal: Negative.  Negative for heartburn, nausea and vomiting.  Musculoskeletal: Negative.  Negative for myalgias.  Neurological: Negative.  Negative for dizziness, focal weakness, seizures and headaches.  Psychiatric/Behavioral: Negative.  Negative for suicidal ideas.    Past Medical History:  Diagnosis Date   Abnormal EKG    Anxiety    Diabetes mellitus without complication (HCC)    Dizzy    Hypertension    Kidney stones    Racing  heart beat     Past Surgical History:  Procedure Laterality Date   CYSTOSCOPY     LEFT URETEROSCOPIC STONE MANIPULATION AND REMOVAL; PLACEMENT OF  LEFT DOUBLE-J URETERAL STENT   CYSTOSCOPY W/ URETERAL STENT PLACEMENT     LEFT DOUBLE-J URETERAL STENT   ORIF ANKLE FRACTURE Left 08/17/2012   Procedure: OPEN REDUCTION INTERNAL FIXATION (ORIF) ANKLE FRACTURE/Left;  Surgeon: Wylene Simmer, MD;  Location: [place];  Service: Orthopedics;  Laterality: Left;   TIBIA IM NAIL INSERTION Left 08/17/2012   Procedure: INTRAMEDULLARY (IM) NAIL TIBIAL/Left;  Surgeon: Wylene Simmer, MD;  Location: [place];  Service: Orthopedics;  Laterality: Left;    Family History  Problem Relation Age of Onset   Hypertension Father        lived to 39   Other Brother        MVA   Kidney disease Sister    Other Brother        Stomach problems    Social History Reviewed with no changes to be made today.   Outpatient Medications Prior to Visit  Medication Sig Dispense Refill   amLODipine (NORVASC) 10 MG tablet Take 1 tablet by mouth once daily 90 tablet 0   Blood Glucose Monitoring Suppl (ONE TOUCH ULTRA 2) w/Device KIT Check blood sugar by fingerstick 2x a day. ICD10 E11.8 and E11.65 1 each 0   cholecalciferol (VITAMIN D) 1000 units tablet Take 1,000 Units by mouth daily.     FARXIGA 10 MG TABS tablet TAKE 1 TABLET BY MOUTH DAILY BEFORE BREAKFAST. 90 tablet 0   fish oil-omega-3 fatty acids 1000 MG capsule Take 1 g by mouth daily.     fluticasone (FLONASE) 50 MCG/ACT nasal spray PLACE 2 SPRAYS INTO BOTH NOSTRILS DAILY. 16 g 2   Glecaprevir-Pibrentasvir (MAVYRET) 100-40 MG TABS Take 3 tablets by mouth daily with breakfast. 84 tablet 1   glucose blood (ONETOUCH ULTRA) test strip Use as instructed. Check blood glucose level by fingerstick twice per day.  E11.65 200 each 3   Insulin Pen Needle 31G X 5 MM MISC Use as instructed. 100 each 12   losartan (COZAAR) 100 MG tablet Take 1 tablet by mouth once daily 90 tablet 0    metFORMIN (GLUCOPHAGE) 500 MG tablet Take 1 tablet (500 mg total) by mouth 2 (two) times daily with a meal. 180 tablet 1   NON FORMULARY [facility]  Anti-fungal (nail)-#1     TRUEPLUS LANCETS 28G MISC Use as instructed 100 each 3   pravastatin (PRAVACHOL) 20 MG tablet Take 1 tablet (20 mg total) by mouth daily. 90 tablet 3   insulin detemir (LEVEMIR FLEXTOUCH) 100 UNIT/ML FlexPen Inject 5 Units into the skin at bedtime. 9 mL 6   nortriptyline (PAMELOR) 50 MG capsule Take 2 capsules (100 mg total) by mouth at bedtime. 60 capsule 3   No facility-administered medications prior to visit.    Allergies  Allergen Reactions   Tylenol [Acetaminophen] Other (See Comments)    ELEVATED LFTs       Objective:  BP 131/81   Pulse 76   Ht 5' 11"  (1.803 m)   Wt 199 lb 2 oz (90.3 kg)   SpO2 96%   BMI 27.77 kg/m  Wt Readings from Last 3 Encounters:  09/19/20 199 lb 2 oz (90.3 kg)  07/25/20 203 lb (92.1 kg)  12/27/19 205 lb (93 kg)    Physical Exam Vitals and nursing note reviewed.  Constitutional:      Appearance: He is well-developed.  HENT:     Head: Normocephalic and atraumatic.  Cardiovascular:     Rate and Rhythm: Normal rate and regular rhythm.     Heart sounds: Normal heart sounds. No murmur heard.   No friction rub. No gallop.  Pulmonary:     Effort: Pulmonary effort is normal. No tachypnea or respiratory distress.     Breath sounds: Normal breath sounds. No decreased breath sounds, wheezing, rhonchi or rales.  Chest:     Chest wall: No tenderness.  Abdominal:     General: Bowel sounds are normal.     Palpations: Abdomen is soft.  Musculoskeletal:        General: Normal range of motion.     Cervical back: Normal range of motion.  Skin:    General: Skin is warm and dry.  Neurological:     Mental Status: He is alert and oriented to person, place, and time.     Coordination: Coordination normal.  Psychiatric:        Behavior: Behavior normal. Behavior is  cooperative.        Thought Content: Thought content normal.        Judgment: Judgment normal.         Patient has been counseled extensively about nutrition and exercise as well as the importance of adherence with medications and regular follow-up. The patient was given clear instructions to go to ER or return to medical center if symptoms don't improve, worsen or new problems develop. The patient verbalized understanding.   Follow-up: Return in about 3 months (around 12/19/2020).   Gildardo Pounds, FNP-BC [facility] and [place], [place]   09/19/2020, 3:55 PM

## 2020-09-20 ENCOUNTER — Ambulatory Visit: Payer: Self-pay | Admitting: Surgery

## 2020-09-20 ENCOUNTER — Encounter: Payer: Self-pay | Admitting: Surgery

## 2020-09-20 DIAGNOSIS — K6289 Other specified diseases of anus and rectum: Secondary | ICD-10-CM | POA: Diagnosis not present

## 2020-09-20 DIAGNOSIS — Z8619 Personal history of other infectious and parasitic diseases: Secondary | ICD-10-CM | POA: Diagnosis not present

## 2020-09-20 DIAGNOSIS — K7469 Other cirrhosis of liver: Secondary | ICD-10-CM | POA: Diagnosis not present

## 2020-09-20 DIAGNOSIS — B1920 Unspecified viral hepatitis C without hepatic coma: Secondary | ICD-10-CM | POA: Diagnosis not present

## 2020-09-20 LAB — CMP14+EGFR
ALT: 29 IU/L (ref 0–44)
AST: 22 IU/L (ref 0–40)
Albumin/Globulin Ratio: 1.8 (ref 1.2–2.2)
Albumin: 5 g/dL — ABNORMAL HIGH (ref 3.8–4.8)
Alkaline Phosphatase: 69 IU/L (ref 44–121)
BUN/Creatinine Ratio: 20 (ref 10–24)
BUN: 17 mg/dL (ref 8–27)
Bilirubin Total: 0.4 mg/dL (ref 0.0–1.2)
CO2: 24 mmol/L (ref 20–29)
Calcium: 9.9 mg/dL (ref 8.6–10.2)
Chloride: 100 mmol/L (ref 96–106)
Creatinine, Ser: 0.85 mg/dL (ref 0.76–1.27)
Globulin, Total: 2.8 g/dL (ref 1.5–4.5)
Glucose: 116 mg/dL — ABNORMAL HIGH (ref 65–99)
Potassium: 4.6 mmol/L (ref 3.5–5.2)
Sodium: 138 mmol/L (ref 134–144)
Total Protein: 7.8 g/dL (ref 6.0–8.5)
eGFR: 93 mL/min/{1.73_m2} (ref >59)

## 2020-10-12 ENCOUNTER — Other Ambulatory Visit: Payer: Self-pay | Admitting: Nurse Practitioner

## 2020-10-12 DIAGNOSIS — I10 Essential (primary) hypertension: Secondary | ICD-10-CM

## 2020-10-31 ENCOUNTER — Other Ambulatory Visit: Payer: Self-pay | Admitting: Nurse Practitioner

## 2020-11-01 DIAGNOSIS — K642 Third degree hemorrhoids: Secondary | ICD-10-CM | POA: Diagnosis not present

## 2020-11-01 DIAGNOSIS — K641 Second degree hemorrhoids: Secondary | ICD-10-CM | POA: Diagnosis not present

## 2020-11-01 DIAGNOSIS — D1801 Hemangioma of skin and subcutaneous tissue: Secondary | ICD-10-CM | POA: Diagnosis not present

## 2020-11-01 DIAGNOSIS — L980 Pyogenic granuloma: Secondary | ICD-10-CM | POA: Diagnosis not present

## 2020-11-01 DIAGNOSIS — K6289 Other specified diseases of anus and rectum: Secondary | ICD-10-CM | POA: Diagnosis not present

## 2020-11-15 ENCOUNTER — Other Ambulatory Visit: Payer: Self-pay | Admitting: Nurse Practitioner

## 2020-11-15 DIAGNOSIS — L980 Pyogenic granuloma: Secondary | ICD-10-CM | POA: Insufficient documentation

## 2020-11-15 DIAGNOSIS — K641 Second degree hemorrhoids: Secondary | ICD-10-CM | POA: Insufficient documentation

## 2020-11-16 NOTE — Telephone Encounter (Signed)
Requested Prescriptions  Pending Prescriptions Disp Refills  . ONETOUCH ULTRA test strip [employer] Med Name: OneTouch Ultra Blue In Vitro Strip] 200 each 0    Sig: USE AS DIRECTED TWICE DAILY     Endocrinology: Diabetes - Testing Supplies Passed - 11/15/2020  5:25 PM      Passed - Valid encounter within last 12 months    Recent Outpatient Visits          1 month ago Controlled type 2 diabetes mellitus with complication, with long-term current use of insulin [facility])   [place], [place] W, NP   4 months ago Buttocks nodule   [place], [place] W, NP   6 months ago Controlled type 2 diabetes mellitus with complication, with long-term current use of insulin [facility])   [place], [place] W, NP   9 months ago Controlled type 2 diabetes mellitus with complication, with long-term current use of insulin [facility])   [place], [place] W, NP   9 months ago Controlled type 2 diabetes mellitus with complication, with long-term current use of insulin [facility])   [place], RPH-CPP      Future Appointments            In 1 month Gildardo Pounds, NP [place]   In 3 months Doren Custard, [place] Krebs, NP [facility]

## 2020-11-18 ENCOUNTER — Other Ambulatory Visit: Payer: Self-pay | Admitting: Nurse Practitioner

## 2020-11-18 NOTE — Telephone Encounter (Signed)
Called about recent strip refill- they are asking for a diagnosis code. Please send.

## 2020-11-20 ENCOUNTER — Other Ambulatory Visit: Payer: Self-pay | Admitting: Family Medicine

## 2020-11-20 DIAGNOSIS — Z794 Long term (current) use of insulin: Secondary | ICD-10-CM

## 2020-11-21 NOTE — Telephone Encounter (Signed)
   Notes to clinic Pharmacy requesting diagnosis code.

## 2020-12-14 ENCOUNTER — Other Ambulatory Visit: Payer: Self-pay | Admitting: Nurse Practitioner

## 2020-12-14 DIAGNOSIS — E785 Hyperlipidemia, unspecified: Secondary | ICD-10-CM

## 2020-12-14 DIAGNOSIS — I10 Essential (primary) hypertension: Secondary | ICD-10-CM

## 2020-12-15 NOTE — Telephone Encounter (Signed)
Requested Prescriptions  Pending Prescriptions Disp Refills  . amLODipine (NORVASC) 10 MG tablet [Pharmacy Med Name: amLODIPine Besylate 10 MG Oral Tablet] 90 tablet 0    Sig: Take 1 tablet by mouth once daily     Cardiovascular:  Calcium Channel Blockers Passed - 12/14/2020  8:15 AM      Passed - Last BP in normal range    BP Readings from Last 1 Encounters:  09/19/20 131/81         Passed - Valid encounter within last 6 months    Recent Outpatient Visits          2 months ago Controlled type 2 diabetes mellitus with complication, with long-term current use of insulin [facility])   [place], [place] W, NP   5 months ago Buttocks nodule   [place], [place] W, NP   7 months ago Controlled type 2 diabetes mellitus with complication, with long-term current use of insulin [facility])   [place], [place] W, NP   10 months ago Controlled type 2 diabetes mellitus with complication, with long-term current use of insulin [facility])   [place], [place] W, NP   10 months ago Controlled type 2 diabetes mellitus with complication, with long-term current use of insulin [facility])   [place], RPH-CPP      Future Appointments            In 1 week Gildardo Pounds, NP [place]   In 2 months Doren Custard, Mel[place] Krebs, NP [facility]           . rosuvastatin (CRESTOR) 10 MG tablet [Pharmacy Med Name: Rosuvastatin Calcium 10 MG Oral Tablet] 90 tablet 0    Sig: Take 1 tablet by mouth once daily     Cardiovascular:  Antilipid - Statins Failed - 12/14/2020  8:15 AM      Failed - HDL in normal range and within 360 days    HDL  Date Value Ref Range Status  06/14/2020 38 (L) >39 mg/dL Final         Failed - Triglycerides in normal range and  within 360 days    Triglycerides  Date Value Ref Range Status  06/14/2020 232 (H) 0 - 149 mg/dL Final         Passed - Total Cholesterol in normal range and within 360 days    Cholesterol, Total  Date Value Ref Range Status  06/14/2020 167 100 - 199 mg/dL Final         Passed - LDL in normal range and within 360 days    LDL Chol Calc (NIH)  Date Value Ref Range Status  06/14/2020 90 0 - 99 mg/dL Final         Passed - Patient is not pregnant      Passed - Valid encounter within last 12 months    Recent Outpatient Visits          2 months ago Controlled type 2 diabetes mellitus with complication, with long-term current use of insulin [facility])   [place], [place] Buff, NP   5 months ago Buttocks nodule   [place], [place] W, NP   7 months ago Controlled type 2 diabetes mellitus with complication,  with long-term current use of insulin [facility])   [place], [place] W, NP   10 months ago Controlled type 2 diabetes mellitus with complication, with long-term current use of insulin [facility])   [place], [place] W, NP   10 months ago Controlled type 2 diabetes mellitus with complication, with long-term current use of insulin [facility])   [place], RPH-CPP      Future Appointments            In 1 week Gildardo Pounds, NP [place]   In 2 months Doren Custard, Melton Krebs, NP [facility], [facility]

## 2020-12-22 ENCOUNTER — Encounter: Payer: Self-pay | Admitting: Nurse Practitioner

## 2020-12-22 ENCOUNTER — Ambulatory Visit: Payer: Medicare Other | Attending: Nurse Practitioner | Admitting: Nurse Practitioner

## 2020-12-22 ENCOUNTER — Other Ambulatory Visit: Payer: Self-pay

## 2020-12-22 VITALS — BP 143/78 | HR 74 | Ht 71.0 in | Wt 201.4 lb

## 2020-12-22 DIAGNOSIS — Z23 Encounter for immunization: Secondary | ICD-10-CM

## 2020-12-22 DIAGNOSIS — E118 Type 2 diabetes mellitus with unspecified complications: Secondary | ICD-10-CM

## 2020-12-22 DIAGNOSIS — Z794 Long term (current) use of insulin: Secondary | ICD-10-CM

## 2020-12-22 DIAGNOSIS — E785 Hyperlipidemia, unspecified: Secondary | ICD-10-CM | POA: Diagnosis not present

## 2020-12-22 DIAGNOSIS — E1169 Type 2 diabetes mellitus with other specified complication: Secondary | ICD-10-CM | POA: Diagnosis not present

## 2020-12-22 LAB — POCT GLYCOSYLATED HEMOGLOBIN (HGB A1C): Hemoglobin A1C: 7.4 % — AB (ref 4.0–5.6)

## 2020-12-22 LAB — GLUCOSE, POCT (MANUAL RESULT ENTRY): POC Glucose: 156 mg/dl — AB (ref 70–99)

## 2020-12-22 MED ORDER — DAPAGLIFLOZIN PROPANEDIOL 10 MG PO TABS: 10.0000 mg | ORAL_TABLET | Freq: Every day | ORAL | 1 refills | Status: AC

## 2020-12-22 MED ORDER — METFORMIN HCL 500 MG PO TABS: 500.0000 mg | ORAL_TABLET | Freq: Every day | ORAL | 1 refills | Status: DC

## 2020-12-22 NOTE — Progress Notes (Signed)
Assessment & Plan:  Phillip Paul was seen today for diabetes.  Diagnoses and all orders for this visit:  Controlled type 2 diabetes mellitus with complication, with long-term current use of insulin (HCC) -     POCT glycosylated hemoglobin (Hb A1C) -     POCT glucose (manual entry) -     CMP14+EGFR -     metFORMIN (GLUCOPHAGE) 500 MG tablet; Take 1 tablet (500 mg total) by mouth daily with breakfast. -     dapagliflozin propanediol (FARXIGA) 10 MG TABS tablet; Take 1 tablet (10 mg total) by mouth daily before breakfast.  Type 2 diabetes mellitus with hyperlipidemia (HCC) -     Lipid panel  Need for pneumococcal vaccination -     Pneumococcal conjugate vaccine 20-valent   Patient has been counseled on age-appropriate routine health concerns for screening and prevention. These are reviewed and up-to-date. Referrals have been placed accordingly. Immunizations are up-to-date or declined.    Subjective:   Chief Complaint  Patient presents with   Diabetes   HPI Phillip Paul 70 y.o. male presents to office today for follow-up to hypertension and diabetes  He has a past medical history of Abnormal EKG, Anxiety, DM, Dizzy, Hypertension, Kidney stones, and Racing heart beat.   DM  Not quite at goal.  Will increase Levemir to 10 units twice a day.  Due to side effects with metformin we will decrease from 500 mg twice daily to 500 mg daily.  LDL not at goal although he does endorse adherence to taking omega-3 1 g daily and rosuvastatin 10 mg daily. Lab Results  Component Value Date   HGBA1C 7.4 (A) 12/22/2020    Lab Results  Component Value Date   LDLCALC 90 06/14/2020     HTN Blood pressure is not optimal today.  He does endorse adherence taking amlodipine 10 mg daily and losartan 100 mg daily as prescribed.  He walks 6 to 10 miles per day.  Trying to eat better and maintain control of his blood pressure and diabetes. BP Readings from Last 3 Encounters:  12/22/20 (!)  143/78  09/19/20 131/81  07/25/20 (!) 146/88    Review of Systems  Constitutional:  Negative for fever, malaise/fatigue and weight loss.  HENT: Negative.  Negative for nosebleeds.   Eyes: Negative.  Negative for blurred vision, double vision and photophobia.  Respiratory: Negative.  Negative for cough and shortness of breath.   Cardiovascular: Negative.  Negative for chest pain, palpitations and leg swelling.  Gastrointestinal: Negative.  Negative for heartburn, nausea and vomiting.  Musculoskeletal: Negative.  Negative for myalgias.  Neurological: Negative.  Negative for dizziness, focal weakness, seizures and headaches.  Psychiatric/Behavioral: Negative.  Negative for suicidal ideas.    Past Medical History:  Diagnosis Date   Abnormal EKG    Anxiety    Diabetes mellitus without complication (HCC)    Dizzy    Hypertension    Kidney stones    Racing heart beat     Past Surgical History:  Procedure Laterality Date   CYSTOSCOPY     LEFT URETEROSCOPIC STONE MANIPULATION AND REMOVAL; PLACEMENT OF  LEFT DOUBLE-J URETERAL STENT   CYSTOSCOPY W/ URETERAL STENT PLACEMENT     LEFT DOUBLE-J URETERAL STENT   ORIF ANKLE FRACTURE Left 08/17/2012   Procedure: OPEN REDUCTION INTERNAL FIXATION (ORIF) ANKLE FRACTURE/Left;  Surgeon: Wylene Simmer, MD;  Location: [place];  Service: Orthopedics;  Laterality: Left;   TIBIA IM NAIL INSERTION Left 08/17/2012   Procedure:  INTRAMEDULLARY (IM) NAIL TIBIAL/Left;  Surgeon: Wylene Simmer, MD;  Location: [place];  Service: Orthopedics;  Laterality: Left;    Family History  Problem Relation Age of Onset   Hypertension Father        lived to 75   Other Brother        MVA   Kidney disease Sister    Other Brother        Stomach problems    Social History Reviewed with no changes to be made today.   Outpatient Medications Prior to Visit  Medication Sig Dispense Refill   amLODipine (NORVASC) 10 MG tablet Take 1 tablet by mouth once daily 90 tablet 0   Blood  Glucose Monitoring Suppl (ONE TOUCH ULTRA 2) w/Device KIT Check blood sugar by fingerstick 2x a day. ICD10 E11.8 and E11.65 1 each 0   cholecalciferol (VITAMIN D) 1000 units tablet Take 1,000 Units by mouth daily.     fish oil-omega-3 fatty acids 1000 MG capsule Take 1 g by mouth daily.     fluticasone (FLONASE) 50 MCG/ACT nasal spray PLACE 2 SPRAYS INTO BOTH NOSTRILS DAILY. 16 g 2   glucose blood (ONETOUCH ULTRA) test strip USE AS DIRECTED TWICE DAILY. Dx E11.8 200 each 0   Insulin Pen Needle 31G X 5 MM MISC Use as instructed. 100 each 12   losartan (COZAAR) 100 MG tablet Take 1 tablet by mouth once daily 90 tablet 0   NON FORMULARY [facility]  Anti-fungal (nail)-#1     rosuvastatin (CRESTOR) 10 MG tablet Take 1 tablet by mouth once daily 90 tablet 0   TRUEPLUS LANCETS 28G MISC Use as instructed 100 each 3   FARXIGA 10 MG TABS tablet TAKE 1 TABLET BY MOUTH DAILY BEFORE BREAKFAST. 90 tablet 0   Glecaprevir-Pibrentasvir (MAVYRET) 100-40 MG TABS Take 3 tablets by mouth daily with breakfast. (Patient not taking: Reported on 12/22/2020) 84 tablet 1   insulin detemir (LEVEMIR) 100 UNIT/ML FlexPen Inject 10 Units into the skin 2 (two) times daily. 18 mL 3   nortriptyline (PAMELOR) 50 MG capsule Take 2 capsules (100 mg total) by mouth at bedtime. 60 capsule 3   insulin detemir (LEVEMIR FLEXTOUCH) 100 UNIT/ML FlexPen Inject 5 Units into the skin at bedtime. 9 mL 6   metFORMIN (GLUCOPHAGE) 500 MG tablet Take 1 tablet (500 mg total) by mouth 2 (two) times daily with a meal. 180 tablet 1   No facility-administered medications prior to visit.    Allergies  Allergen Reactions   Tylenol [Acetaminophen] Other (See Comments)    ELEVATED LFTs       Objective:    BP (!) 143/78   Pulse 74   Ht 5' 11"  (1.803 m)   Wt 201 lb 6 oz (91.3 kg)   SpO2 95%   BMI 28.09 kg/m  Wt Readings from Last 3 Encounters:  12/22/20 201 lb 6 oz (91.3 kg)  09/19/20 199 lb 2 oz (90.3 kg)  07/25/20 203 lb (92.1  kg)    Physical Exam Vitals and nursing note reviewed.  Constitutional:      Appearance: He is well-developed.  HENT:     Head: Normocephalic and atraumatic.  Cardiovascular:     Rate and Rhythm: Normal rate and regular rhythm.     Heart sounds: Normal heart sounds. No murmur heard.   No friction rub. No gallop.  Pulmonary:     Effort: Pulmonary effort is normal. No tachypnea or respiratory distress.     Breath sounds:  Normal breath sounds. No decreased breath sounds, wheezing, rhonchi or rales.  Chest:     Chest wall: No tenderness.  Abdominal:     General: Bowel sounds are normal.     Palpations: Abdomen is soft.  Musculoskeletal:        General: Normal range of motion.     Cervical back: Normal range of motion.  Skin:    General: Skin is warm and dry.  Neurological:     Mental Status: He is alert and oriented to person, place, and time.     Coordination: Coordination normal.  Psychiatric:        Behavior: Behavior normal. Behavior is cooperative.        Thought Content: Thought content normal.        Judgment: Judgment normal.         Patient has been counseled extensively about nutrition and exercise as well as the importance of adherence with medications and regular follow-up. The patient was given clear instructions to go to ER or return to medical center if symptoms don't improve, worsen or new problems develop. The patient verbalized understanding.   Follow-up: Return in about 3 months (around 03/22/2021).   Gildardo Pounds, FNP-BC [facility] and [facility] [place], [place]   12/22/2020, 12:55 PM

## 2020-12-23 LAB — CMP14+EGFR
ALT: 20 IU/L (ref 0–44)
AST: 21 IU/L (ref 0–40)
Albumin/Globulin Ratio: 1.8 (ref 1.2–2.2)
Albumin: 4.7 g/dL (ref 3.8–4.8)
Alkaline Phosphatase: 91 IU/L (ref 44–121)
BUN/Creatinine Ratio: 22 (ref 10–24)
BUN: 15 mg/dL (ref 8–27)
Bilirubin Total: 0.3 mg/dL (ref 0.0–1.2)
CO2: 23 mmol/L (ref 20–29)
Calcium: 9.7 mg/dL (ref 8.6–10.2)
Chloride: 100 mmol/L (ref 96–106)
Creatinine, Ser: 0.67 mg/dL — ABNORMAL LOW (ref 0.76–1.27)
Globulin, Total: 2.6 g/dL (ref 1.5–4.5)
Glucose: 120 mg/dL — ABNORMAL HIGH (ref 70–99)
Potassium: 4.2 mmol/L (ref 3.5–5.2)
Sodium: 138 mmol/L (ref 134–144)
Total Protein: 7.3 g/dL (ref 6.0–8.5)
eGFR: 100 mL/min/{1.73_m2} (ref >59)

## 2020-12-23 LAB — LIPID PANEL
Chol/HDL Ratio: 3.1 ratio (ref 0.0–5.0)
Cholesterol, Total: 135 mg/dL (ref 100–199)
HDL: 44 mg/dL (ref >39)
LDL Chol Calc (NIH): 68 mg/dL (ref 0–99)
Triglycerides: 129 mg/dL (ref 0–149)
VLDL Cholesterol Cal: 23 mg/dL (ref 5–40)

## 2021-01-11 ENCOUNTER — Other Ambulatory Visit: Payer: Self-pay | Admitting: Nurse Practitioner

## 2021-01-11 DIAGNOSIS — I10 Essential (primary) hypertension: Secondary | ICD-10-CM

## 2021-01-16 ENCOUNTER — Other Ambulatory Visit: Payer: Self-pay

## 2021-01-16 ENCOUNTER — Other Ambulatory Visit: Payer: Medicare Other

## 2021-01-16 DIAGNOSIS — B182 Chronic viral hepatitis C: Secondary | ICD-10-CM

## 2021-01-19 LAB — HEPATITIS C RNA QUANTITATIVE
HCV Quantitative Log: <1.18 log IU/mL
HCV RNA, PCR, QN: <15 IU/mL

## 2021-01-19 LAB — AFP TUMOR MARKER: AFP-Tumor Marker: 1.7 ng/mL (ref <6.1)

## 2021-01-19 LAB — PROTIME-INR
INR: 1
Prothrombin Time: 10.1 s (ref 9.0–11.5)

## 2021-02-01 DIAGNOSIS — H10501 Unspecified blepharoconjunctivitis, right eye: Secondary | ICD-10-CM | POA: Diagnosis not present

## 2021-02-02 ENCOUNTER — Ambulatory Visit (HOSPITAL_COMMUNITY)
Admission: RE | Admit: 2021-02-02 | Discharge: 2021-02-02 | Disposition: A | Discharge status: Home / Self Care | Payer: Medicare Other | Source: Ambulatory Visit | Attending: Infectious Diseases | Admitting: Infectious Diseases

## 2021-02-02 ENCOUNTER — Other Ambulatory Visit: Payer: Self-pay

## 2021-02-02 DIAGNOSIS — B182 Chronic viral hepatitis C: Secondary | ICD-10-CM | POA: Insufficient documentation

## 2021-02-02 DIAGNOSIS — K824 Cholesterolosis of gallbladder: Secondary | ICD-10-CM | POA: Diagnosis not present

## 2021-02-05 ENCOUNTER — Telehealth: Payer: Self-pay | Admitting: Infectious Diseases

## 2021-02-05 ENCOUNTER — Telehealth: Payer: Self-pay

## 2021-02-05 DIAGNOSIS — K824 Cholesterolosis of gallbladder: Secondary | ICD-10-CM

## 2021-02-05 NOTE — Telephone Encounter (Signed)
I called Phillip Paul to discus results of ultra sound results -   Over the last 6 months he has had an enlargement of previous gall bladder polyp and a new one that has formed. Phillip Paul has had no changes in his health and continues to have no syptoms from our discssion over the phone. We talked about referral to general surgery to review to ensure no concern here given rapid changes over 78m time frame.   Questions answered - referral placed.    Janene Madeira, MSN, NP-C [facility] for Infectious Disease [email or web address] Pager: [phone] Office: [phone] [place]: [phone]

## 2021-02-05 NOTE — Telephone Encounter (Signed)
[facility] Radiology called to give a call report regarding patient's abdominal US. Please review.   T Brooks Sailors

## 2021-02-10 ENCOUNTER — Encounter: Payer: Self-pay | Admitting: Nurse Practitioner

## 2021-02-12 ENCOUNTER — Emergency Department (HOSPITAL_BASED_OUTPATIENT_CLINIC_OR_DEPARTMENT_OTHER): Payer: Medicare Other | Admitting: Radiology

## 2021-02-12 ENCOUNTER — Emergency Department (HOSPITAL_BASED_OUTPATIENT_CLINIC_OR_DEPARTMENT_OTHER): Payer: Medicare Other

## 2021-02-12 ENCOUNTER — Emergency Department (HOSPITAL_BASED_OUTPATIENT_CLINIC_OR_DEPARTMENT_OTHER)
Admission: EM | Admit: 2021-02-12 | Discharge: 2021-02-12 | Disposition: A | Discharge status: Home / Self Care | Payer: Medicare Other | Attending: Emergency Medicine | Admitting: Emergency Medicine

## 2021-02-12 ENCOUNTER — Other Ambulatory Visit: Payer: Self-pay

## 2021-02-12 ENCOUNTER — Encounter (HOSPITAL_BASED_OUTPATIENT_CLINIC_OR_DEPARTMENT_OTHER): Payer: Self-pay | Admitting: Urology

## 2021-02-12 DIAGNOSIS — Z794 Long term (current) use of insulin: Secondary | ICD-10-CM | POA: Insufficient documentation

## 2021-02-12 DIAGNOSIS — Z79899 Other long term (current) drug therapy: Secondary | ICD-10-CM | POA: Insufficient documentation

## 2021-02-12 DIAGNOSIS — R079 Chest pain, unspecified: Secondary | ICD-10-CM | POA: Diagnosis not present

## 2021-02-12 DIAGNOSIS — I700 Atherosclerosis of aorta: Secondary | ICD-10-CM | POA: Diagnosis not present

## 2021-02-12 LAB — TROPONIN I (HIGH SENSITIVITY)
Troponin I (High Sensitivity): 4 ng/L (ref <18)
Troponin I (High Sensitivity): 5 ng/L

## 2021-02-12 LAB — CBC
HCT: 44.6 % (ref 39.0–52.0)
Hemoglobin: 14.7 g/dL (ref 13.0–17.0)
MCH: 29.1 pg (ref 26.0–34.0)
MCHC: 33 g/dL (ref 30.0–36.0)
MCV: 88.1 fL (ref 80.0–100.0)
Platelets: 212 K/uL (ref 150–400)
RBC: 5.06 MIL/uL (ref 4.22–5.81)
RDW: 13.7 % (ref 11.5–15.5)
WBC: 7.3 K/uL (ref 4.0–10.5)
nRBC: 0 % (ref 0.0–0.2)

## 2021-02-12 LAB — BASIC METABOLIC PANEL WITH GFR
Anion gap: 10 (ref 5–15)
BUN: 17 mg/dL (ref 8–23)
CO2: 26 mmol/L (ref 22–32)
Calcium: 9.5 mg/dL (ref 8.9–10.3)
Chloride: 103 mmol/L (ref 98–111)
Creatinine, Ser: 0.65 mg/dL (ref 0.61–1.24)
GFR, Estimated: >60 mL/min
Glucose, Bld: 114 mg/dL — ABNORMAL HIGH (ref 70–99)
Potassium: 4 mmol/L (ref 3.5–5.1)
Sodium: 139 mmol/L (ref 135–145)

## 2021-02-12 MED ORDER — IOHEXOL 300 MG/ML  SOLN
75.0000 mL | Freq: Once | INTRAMUSCULAR | Status: AC | PRN
  Administered 2021-02-12: 75 mL via INTRAVENOUS

## 2021-02-12 NOTE — ED Provider Notes (Signed)
°  Physical Exam  BP 123/88    Pulse 60    Temp 97.6 F (36.4 C)    Resp 10    Ht 5\' 11"  (1.803 m)    Wt 91.3 kg    SpO2 91%    BMI 28.07 kg/m     Procedures  Procedures  ED Course / MDM    Medical Decision Making Amount and/or Complexity of Data Reviewed Labs: ordered. Radiology: ordered.  Risk Prescription drug management.   6M, presenting with CP. Cardiac workup 6 years ago. Mass on CXR. CT W contrast pending. Delta trop pending, if neg, will go to outpatient cards for coronary CT.   The patient's repeat troponin was negative.  Chest CT did not reveal any evidence of mass.  Plan for outpatient cardiology work-up per previous MDM.  Stable for discharge.       Regan Lemming, MD 02/12/21 [phone]

## 2021-02-12 NOTE — Discharge Instructions (Addendum)
Follow-up with cardiology clinic in 1 week for further testing.  However return immediately back to the ER if you have recurrent chest pain or worsening symptoms.  Your chest CT revealed the following: MPRESSION:  1. No evidence of pulmonary or mediastinal mass. The radiographic  finding may be due to paraspinal osteophytes.  2. Scattered mild pulmonary scarring and central airway thickening.  No acute chest findings.  3. Coronary and Aortic Atherosclerosis (ICD10-I70.0). No acute  vascular findings.       Be sure to follow-up with your primary care doctor in 1 or 2 weeks regarding the abnormal findings on your chest CT.

## 2021-02-12 NOTE — ED Provider Notes (Signed)
[facility] EMERGENCY DEPT Provider Note   CSN: [id number] Arrival date & time: 02/12/21  1138     History  Chief Complaint  Patient presents with   Chest Pain    Phillip Paul is a 71 y.o. male.  Patient presents with chief complaint of chest pain.  Describes it as a mid chest ache lasting about 15 to 20 minutes at a time.  Is been going on and off for the past 3 days.  He states that yesterday he went on his normal walk but he had to stop due to chest pain and rest.  Today he woke up with chest pain at rest in bed again.  Describes a left-sided ache rating down the left arm.  No associated shortness of breath symptoms lasted about 30 minutes and has since resolved.  He had a prior angiogram about 6 to 7 years ago he states that he reports was normal.  Does not follow-up with cardiology otherwise.      Home Medications Prior to Admission medications   Medication Sig Start Date End Date Taking? Authorizing Provider  amLODipine (NORVASC) 10 MG tablet Take 1 tablet by mouth once daily 12/15/20   Gildardo Pounds, NP  Blood Glucose Monitoring Suppl (ONE TOUCH ULTRA 2) w/Device KIT Check blood sugar by fingerstick 2x a day. ICD10 E11.8 and E11.65 06/02/18   Charlott Rakes, MD  cholecalciferol (VITAMIN D) 1000 units tablet Take 1,000 Units by mouth daily.    [provider]  dapagliflozin propanediol (FARXIGA) 10 MG TABS tablet Take 1 tablet (10 mg total) by mouth daily before breakfast. 12/22/20 03/22/21  Gildardo Pounds, NP  fish oil-omega-3 fatty acids 1000 MG capsule Take 1 g by mouth daily.    [provider]  fluticasone (FLONASE) 50 MCG/ACT nasal spray PLACE 2 SPRAYS INTO BOTH NOSTRILS DAILY. 08/26/18   Gildardo Pounds, NP  Glecaprevir-Pibrentasvir (MAVYRET) 100-40 MG TABS Take 3 tablets by mouth daily with breakfast. Patient not taking: Reported on 12/22/2020 07/08/19   Kuppelweiser, Cassie L, RPH-CPP  glucose blood (ONETOUCH ULTRA) test strip  USE AS DIRECTED TWICE DAILY. Dx E11.8 11/22/20   Charlott Rakes, MD  insulin detemir (LEVEMIR) 100 UNIT/ML FlexPen Inject 10 Units into the skin 2 (two) times daily. 09/19/20 12/18/20  Gildardo Pounds, NP  Insulin Pen Needle 31G X 5 MM MISC Use as instructed. 05/02/20   Gildardo Pounds, NP  losartan (COZAAR) 100 MG tablet Take 1 tablet by mouth once daily 01/11/21   Charlott Rakes, MD  metFORMIN (GLUCOPHAGE) 500 MG tablet Take 1 tablet (500 mg total) by mouth daily with breakfast. 12/22/20 03/22/21  Gildardo Pounds, NP  [facility]  Anti-fungal (nail)-#1    [provider]  nortriptyline (PAMELOR) 50 MG capsule Take 2 capsules (100 mg total) by mouth at bedtime. 05/02/20 06/01/20  Gildardo Pounds, NP  rosuvastatin (CRESTOR) 10 MG tablet Take 1 tablet by mouth once daily 12/15/20   Gildardo Pounds, NP  TRUEPLUS LANCETS 28G MISC Use as instructed 05/07/17   Gildardo Pounds, NP      Allergies    Tylenol [acetaminophen]    Review of Systems   Review of Systems  Constitutional:  Negative for fever.  HENT:  Negative for ear pain and sore throat.   Eyes:  Negative for pain.  Respiratory:  Negative for cough.   Cardiovascular:  Positive for chest pain.  Gastrointestinal:  Negative for abdominal pain.  Genitourinary:  Negative for flank pain.  Musculoskeletal:  Negative for back pain.  Skin:  Negative for color change and rash.  Neurological:  Negative for syncope.  All other systems reviewed and are negative.  Physical Exam Updated Vital Signs BP 123/88    Pulse 60    Temp 97.6 F (36.4 C)    Resp 10    Ht 5' 11"  (1.803 m)    Wt 91.3 kg    SpO2 91%    BMI 28.07 kg/m  Physical Exam Constitutional:      Appearance: He is well-developed.  HENT:     Head: Normocephalic.     Nose: Nose normal.  Eyes:     Extraocular Movements: Extraocular movements intact.  Cardiovascular:     Rate and Rhythm: Normal rate.  Pulmonary:     Effort: Pulmonary effort is normal.   Skin:    Coloration: Skin is not jaundiced.  Neurological:     Mental Status: He is alert and oriented to person, place, and time. Mental status is at baseline.    ED Results / Procedures / Treatments   Labs (all labs ordered are listed, but only abnormal results are displayed) Labs Reviewed  BASIC METABOLIC PANEL - Abnormal; Notable for the following components:      Result Value   Glucose, Bld 114 (*)    All other components within normal limits  CBC  TROPONIN I (HIGH SENSITIVITY)  TROPONIN I (HIGH SENSITIVITY)    EKG EKG Interpretation  Date/Time:  Monday February 12 2021 11:50:41 EST Ventricular Rate:  78 PR Interval:  160 QRS Duration: 88 QT Interval:  394 QTC Calculation: 449 R Axis:   -5 Text Interpretation: Normal sinus rhythm Possible Anterior infarct , age undetermined Abnormal ECG When compared with ECG of 24-Mar-2017 15:43, Questionable change in QRS axis Confirmed by Thamas Jaegers ([id number]) on 02/12/2021 12:19:23 PM  Radiology DG Chest 2 View  Result Date: 02/12/2021 CLINICAL DATA:  Chest pain EXAM: CHEST - 2 VIEW COMPARISON:  Chest x-ray dated August 17, 2012 FINDINGS: The heart size and mediastinal contours are within normal limits. New indeterminate rounded mediastinal opacity located inferior to the right hilum. The visualized skeletal structures are unremarkable. IMPRESSION: New indeterminate rounded mediastinal opacity located inferior to the right hilum. Recommend contrast enhanced chest CT for further evaluation. Electronically Signed   By: Yetta Glassman M.D.   On: 02/12/2021 12:41    Procedures Procedures    Medications Ordered in ED Medications - No data to display  ED Course/ Medical Decision Making/ A&P                           Medical Decision Making Amount and/or Complexity of Data Reviewed Labs: ordered. Decision-making details documented in ED Course.    Details: Labs unremarkable troponin is negative.  CBC and chemistry normal. Radiology:  ordered. ECG/medicine tests: ordered and independent interpretation performed. Decision-making details documented in ED Course.    Details: Normal sinus rhythm, no ST elevations no ST depressions noted. Discussion of management or test interpretation with external provider(s): Case discussed with on-call cardiologist Dr. Marlou Porch   Cardiology will arrange for close outpatient follow-up for this patient.  He currently remains symptom-free.  I recommended avoidance of strenuous activity until cleared by cardiology recommending immediate return to the ER if he has recurrent chest pain or any additional concerns.  Radiologist noted abnormal chest x-ray finding and recommended CT of the chest which has been  ordered.  Patient advised continued outpatient follow-up with his primary care doctor regarding these findings.        Final Clinical Impression(s) / ED Diagnoses Final diagnoses:  Chest pain, unspecified type    Rx / DC Orders ED Discharge Orders     None         Luna Fuse, MD 02/12/21 1520

## 2021-02-12 NOTE — ED Triage Notes (Signed)
Intermittent Chest pain x 3 days,  Woke up from sleep today radiating to left arm  Denies SOB,  H/o DM and HTN NAD now, A&O x 4

## 2021-02-14 ENCOUNTER — Ambulatory Visit: Payer: Medicare Other | Admitting: Cardiology

## 2021-02-19 ENCOUNTER — Ambulatory Visit (INDEPENDENT_AMBULATORY_CARE_PROVIDER_SITE_OTHER): Payer: Medicare Other | Admitting: Infectious Diseases

## 2021-02-19 ENCOUNTER — Encounter: Payer: Self-pay | Admitting: Infectious Diseases

## 2021-02-19 ENCOUNTER — Other Ambulatory Visit: Payer: Self-pay

## 2021-02-19 DIAGNOSIS — K824 Cholesterolosis of gallbladder: Secondary | ICD-10-CM

## 2021-02-19 DIAGNOSIS — K7400 Hepatic fibrosis, unspecified: Secondary | ICD-10-CM

## 2021-02-19 DIAGNOSIS — Z8619 Personal history of other infectious and parasitic diseases: Secondary | ICD-10-CM | POA: Diagnosis not present

## 2021-02-19 NOTE — Assessment & Plan Note (Signed)
Discussed he has been clinically cured. Will have persistent hepatitis c antibody even after cure as this is the body's "memory" of old infection.  Features that were concerning for advanced fibrosis / cirrhosis during work up. Recommended to continue with q67m U/S for [facility] screening.

## 2021-02-19 NOTE — Patient Instructions (Signed)
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  You will always have a positive antibody - this just means you have a history of having had it. Nothing active though.   Please continue your ultrasounds twice a year with Dr. Raul Del. She will take great care of you!  It has been a pleasure working with you

## 2021-02-19 NOTE — Progress Notes (Signed)
Patient Name: Phillip Paul  Date of Birth: Oct 09, 1950  MRN: [id number]  PCP: Gildardo Pounds, NP  Referring Provider: Gildardo Pounds, NP, Ph#: [phone]    CC:  Hep C FU  Abdominal pain RU/epigastric area.    HPI/ROS:  Phillip Paul is a 71 y.o. male .   Medication: Mavyret x 8 weeks   Start Date: 07/10/19   Hepatitis C Genotype: 1a   Fibrosis Score: F4 Fibrotest / FibroScan w/ increased parenchyma echogenicity concerning for possible cirrhosis.    Hepatitis C RNA:  06/22/19 - 201,000  08/06/2019 - < 15  09/14/2019 - < 15  12/27/2019 - < 15 copies  HCC Screening:  01/16/2021 - AFP normal; U/S with no changes to liver but enlarged previous gallbladder polyp and a second one new from last study.     Woke up in the moring with right sided epigastric pain and some nausea. He has had several family members require gall bladder removal. He does not report any vomiting but does have the pains intermittently. Feels as if they may be escalating. Has an appointment with surgery to discuss abnormal findings r/t gallbladder seen on routine HCC screening.   Also having some left sided chest pain that he went to ER for. Ruled out cardiac and pulmonary causes. Has tried some Zantec otc and does not help as much. Feels like he had a musculoskeletal pain after some newer exercises.     ROS:  Constitutional: negative for fevers, chills, fatigue, anorexia Respiratory: negative for asthma, wheezing or dyspnea on exertion Cardiovascular: negative for chest pain, dyspnea, palpitations, lower extremity edema Gastrointestinal: positive for nausea, abdominal pain RUQ/Epigastric; negative for  change in bowel habits, and jaundice Genitourinary: negative for hematuria Hematologic/lymphatic: negative for easy bruising and petechiae Musculoskeletal: L arm pain as described above Neurological: negative for memory problems, paresthesia, gait problems and  tremor Behavioral/Psych: negative for excessive alcohol consumption and illegal drug usage All other systems reviewed and are negative      Past Medical History:  Diagnosis Date   Abnormal EKG    Anxiety    Diabetes mellitus without complication ([place])    Dizzy    Hypertension    Kidney stones    Racing heart beat     Prior to Admission medications   Medication Sig Start Date End Date Taking? Authorizing Provider  amLODipine (NORVASC) 10 MG tablet Take 1 tablet (10 mg total) by mouth daily. 05/11/19   Gildardo Pounds, NP  Blood Glucose Monitoring Suppl (ONE TOUCH ULTRA 2) w/Device KIT Check blood sugar by fingerstick 2x a day. ICD10 E11.8 and E11.65 06/02/18   Charlott Rakes, MD  cholecalciferol (VITAMIN D) 1000 units tablet Take 1,000 Units by mouth daily.    [provider]  fish oil-omega-3 fatty acids 1000 MG capsule Take 1 g by mouth daily.    [provider]  fluticasone (FLONASE) 50 MCG/ACT nasal spray PLACE 2 SPRAYS INTO BOTH NOSTRILS DAILY. 08/26/18   Gildardo Pounds, NP  glucose blood (ONE TOUCH ULTRA TEST) test strip Check blood sugar by fingerstick 2x a day. ICD10 E11.8 and E11.65 06/02/18   Charlott Rakes, MD  Insulin Detemir (LEVEMIR FLEXTOUCH) 100 UNIT/ML Pen Inject 20 Units into the skin daily. Pt. Is taking 20 units of Levemir. 02/02/19   Charlott Rakes, MD  Insulin Pen Needle 31G X 5 MM MISC Use as instructed. 07/18/17   Gildardo Pounds, NP  losartan (COZAAR) 100 MG tablet Take 1  tablet (100 mg total) by mouth daily. 05/11/19   Gildardo Pounds, NP  metFORMIN (GLUCOPHAGE) 500 MG tablet Take 1 tablet (500 mg total) by mouth 2 (two) times daily with a meal. 05/11/19 06/10/19  Gildardo Pounds, NP  [facility]  Anti-fungal (nail)-#1    [provider]  nortriptyline (PAMELOR) 50 MG capsule Take 2 capsules (100 mg total) by mouth at bedtime. 02/01/19   Pieter Partridge, DO  pravastatin (PRAVACHOL) 20 MG tablet Take 1 tablet (20 mg  total) by mouth daily. 01/04/19   Gildardo Pounds, NP  traZODone (DESYREL) 100 MG tablet Take 1 tablet (100 mg total) by mouth at bedtime. 01/04/19   Gildardo Pounds, NP  TRUEPLUS LANCETS 28G MISC Use as instructed 05/07/17   Gildardo Pounds, NP    Allergies  Allergen Reactions   Tylenol [Acetaminophen] Other (See Comments)    ELEVATED LFTs    Social History   Tobacco Use   Smoking status: Every Day    Packs/day: 0.25    Types: Cigarettes   Smokeless tobacco: Never   Tobacco comments:    States about 3 cigaretters per day, and is "processing quitting"  Vaping Use   Vaping Use: Never used  Substance Use Topics   Alcohol use: Not Currently    Comment: Pt. stated he have not drank in 4 years.    Drug use: No     Objective:   Vitals:   02/19/21 0956  BP: (!) 151/85  Pulse: 85  Temp: 98.3 F (36.8 C)  SpO2: 95%   Constitutional: in no apparent distress, well developed and well nourished and oriented times 3 Eyes: anicteric Cardiovascular: Cor RRR and No murmurs Respiratory: clear Gastrointestinal: Bowel sounds are normal, liver is not enlarged, spleen is not enlarged Musculoskeletal: peripheral pulses normal, no pedal edema, no clubbing or cyanosis Skin: negative for - jaundice, spider hemangioma, telangiectasia, palmar erythema, ecchymosis and atrophy; no porphyria cutanea tarda Lymphatic: no cervical lymphadenopathy   Laboratory: Genotype:  Lab Results  Component Value Date   HCVGENOTYPE 1a 06/22/2019   HCV viral load: No results found for: HCVQUANT Lab Results  Component Value Date   WBC 7.3 02/12/2021   HGB 14.7 02/12/2021   HCT 44.6 02/12/2021   MCV 88.1 02/12/2021   PLT 212 02/12/2021    Lab Results  Component Value Date   CREATININE 0.65 02/12/2021   BUN 17 02/12/2021   NA 139 02/12/2021   K 4.0 02/12/2021   CL 103 02/12/2021   CO2 26 02/12/2021    Lab Results  Component Value Date   ALT 20 12/22/2020   AST 21 12/22/2020   GGT 122 (H)  06/22/2019   ALKPHOS 91 12/22/2020    Lab Results  Component Value Date   INR 1.0 01/16/2021   BILITOT 0.3 12/22/2020   ALBUMIN 4.7 12/22/2020    APRI 0.491 (statistically low predictive risk of advanced fibrosis)   FIB-4 1.48 (statistically low predictive risk of advanced fibrosis)     Imaging:  06/2019  Liver:  Hyperechoic hepatic parenchyma, suggesting hepatic steatosis. No focal hepatic lesion is seen. Portal vein is patent on color Doppler imaging with normal direction of blood flow towards the liver. Median kPa: 5.9 Diagnostic category: < or = 9 kPa: in the absence of other known clinical signs, rules out cACLD  01/2021:  No focal lesion identified. Increase in parenchymal echogenicity. Portal vein is patent on color Doppler imaging with normal direction of  blood flow towards the liver No gallstones or wall thickening visualized. There are 2 gallbladder polyps visualized with the largest measuring 8.5 cm. One gallbladder polyp is visualized in the prior examination measuring 7 mm. No sonographic Murphy sign noted by sonographer.     Assessment & Plan:   Problem List Items Addressed This Visit       Unprioritized   Hepatitis C virus infection cured after antiviral drug therapy    Discussed he has been clinically cured. Will have persistent hepatitis c antibody even after cure as this is the body's "memory" of old infection.  Features that were concerning for advanced fibrosis / cirrhosis during work up. Recommended to [facility]mU[facility]       Gall bladder polyp    Previously discussed with Max referral to general surgery to consider enlarging and new formation of gall bladder polyps. He seems to have some symptoms as well - He has a scheduled appt.  I discussed with his PCP also plan of care.       Liver fibrosis, severe   Will have Max continue care with his Primary Care Provider for ongoing screenings.    SJanene Madeira MSN,  NP-C RDelaware County Memorial Hospitalfor Infectious Disease [email or web address] Pager: [phone]: 3[place] [phone]

## 2021-02-21 DIAGNOSIS — K7400 Hepatic fibrosis, unspecified: Secondary | ICD-10-CM | POA: Insufficient documentation

## 2021-02-21 DIAGNOSIS — K824 Cholesterolosis of gallbladder: Secondary | ICD-10-CM | POA: Insufficient documentation

## 2021-02-21 NOTE — Assessment & Plan Note (Signed)
Previously discussed with Max referral to general surgery to consider enlarging and new formation of gall bladder polyps. He seems to have some symptoms as well - He has a scheduled appt.  I discussed with his PCP also plan of care.

## 2021-03-01 ENCOUNTER — Other Ambulatory Visit: Payer: Self-pay | Admitting: Nurse Practitioner

## 2021-03-01 DIAGNOSIS — E785 Hyperlipidemia, unspecified: Secondary | ICD-10-CM

## 2021-03-01 DIAGNOSIS — I10 Essential (primary) hypertension: Secondary | ICD-10-CM

## 2021-03-02 NOTE — Telephone Encounter (Signed)
Requested Prescriptions  Pending Prescriptions Disp Refills   amLODipine (NORVASC) 10 MG tablet [Pharmacy Med Name: amLODIPine Besylate 10 MG Oral Tablet] 90 tablet 0    Sig: Take 1 tablet by mouth once daily     Cardiovascular: Calcium Channel Blockers 2 Failed - 03/01/2021  3:51 PM      Failed - Last BP in normal range    BP Readings from Last 1 Encounters:  02/19/21 (!) 151/85         Passed - Last Heart Rate in normal range    Pulse Readings from Last 1 Encounters:  02/19/21 85         Passed - Valid encounter within last 6 months    Recent Outpatient Visits          2 months ago Controlled type 2 diabetes mellitus with complication, with long-term current use of insulin ([place])   [place], Vernia Buff, NP   5 months ago Controlled type 2 diabetes mellitus with complication, with long-term current use of insulin ([place])   [place], Vernia Buff, NP   7 months ago Buttocks nodule   [place], Vernia Buff, NP   10 months ago Controlled type 2 diabetes mellitus with complication, with long-term current use of insulin ([place])   [place], Vernia Buff, NP   1 year ago Controlled type 2 diabetes mellitus with complication, with long-term current use of insulin ([place])   [place], Vernia Buff, NP      Future Appointments            In 2 months Gildardo Pounds, NP [place]            rosuvastatin (CRESTOR) 10 MG tablet [Pharmacy Med Name: Rosuvastatin Calcium 10 MG Oral Tablet] 90 tablet 2    Sig: Take 1 tablet by mouth once daily     Cardiovascular:  Antilipid - Statins 2 Failed - 03/01/2021  3:51 PM      Failed - Lipid Panel in normal range within the last 12 months    Cholesterol, Total  Date Value Ref Range Status  12/22/2020 135 100 - 199 mg/dL Final   LDL Chol  Calc (NIH)  Date Value Ref Range Status  12/22/2020 68 0 - 99 mg/dL Final   HDL  Date Value Ref Range Status  12/22/2020 44 >39 mg/dL Final   Triglycerides  Date Value Ref Range Status  12/22/2020 129 0 - 149 mg/dL Final         Passed - Cr in normal range and within 360 days    Creat  Date Value Ref Range Status  08/09/2019 0.77 0.70 - 1.25 mg/dL Final    Comment:    For patients >44 years of age, the reference limit for Creatinine is approximately 13% higher for people identified as African-American. .    Creatinine, Ser  Date Value Ref Range Status  02/12/2021 0.65 0.61 - 1.24 mg/dL Final         Passed - Patient is not pregnant      Passed - Valid encounter within last 12 months    Recent Outpatient Visits          2 months ago Controlled type 2 diabetes mellitus with complication, with long-term current use of insulin ([place])   [place]  [facility] Gildardo Pounds, NP   5 months ago Controlled type 2 diabetes mellitus with complication, with long-term current use of insulin [facility])   [place], [place], NP   7 months ago Buttocks nodule   [place], [place] W, NP   10 months ago Controlled type 2 diabetes mellitus with complication, with long-term current use of insulin [facility])   [place], [place] W, NP   1 year ago Controlled type 2 diabetes mellitus with complication, with long-term current use of insulin [facility])   [place], [place] W, NP      Future Appointments            In 2 months Gildardo Pounds, NP [place]

## 2021-03-08 ENCOUNTER — Ambulatory Visit: Payer: Medicare Other | Admitting: Nurse Practitioner

## 2021-03-13 DIAGNOSIS — K7469 Other cirrhosis of liver: Secondary | ICD-10-CM | POA: Diagnosis not present

## 2021-03-13 DIAGNOSIS — K824 Cholesterolosis of gallbladder: Secondary | ICD-10-CM | POA: Diagnosis not present

## 2021-03-23 ENCOUNTER — Ambulatory Visit: Payer: Medicare Other | Admitting: Nurse Practitioner

## 2021-04-09 ENCOUNTER — Ambulatory Visit: Payer: Medicare Other | Attending: Nurse Practitioner | Admitting: Nurse Practitioner

## 2021-04-09 ENCOUNTER — Other Ambulatory Visit: Payer: Self-pay

## 2021-04-09 ENCOUNTER — Encounter: Payer: Self-pay | Admitting: Nurse Practitioner

## 2021-04-09 DIAGNOSIS — K219 Gastro-esophageal reflux disease without esophagitis: Secondary | ICD-10-CM | POA: Diagnosis not present

## 2021-04-09 DIAGNOSIS — E118 Type 2 diabetes mellitus with unspecified complications: Secondary | ICD-10-CM

## 2021-04-09 DIAGNOSIS — R1011 Right upper quadrant pain: Secondary | ICD-10-CM

## 2021-04-09 DIAGNOSIS — Z1211 Encounter for screening for malignant neoplasm of colon: Secondary | ICD-10-CM | POA: Diagnosis not present

## 2021-04-09 DIAGNOSIS — R079 Chest pain, unspecified: Secondary | ICD-10-CM

## 2021-04-09 DIAGNOSIS — Z794 Long term (current) use of insulin: Secondary | ICD-10-CM

## 2021-04-09 MED ORDER — METFORMIN HCL 500 MG PO TABS: 500.0000 mg | ORAL_TABLET | Freq: Two times a day (BID) | ORAL | 1 refills | Status: DC

## 2021-04-09 MED ORDER — OMEPRAZOLE 40 MG PO CPDR: 40.0000 mg | DELAYED_RELEASE_CAPSULE | Freq: Every day | ORAL | 3 refills | Status: DC

## 2021-04-09 NOTE — Progress Notes (Signed)
Virtual Visit via Telephone Note ?Due to national recommendations of social distancing due to Phillip Paul, telehealth visit is felt to be most appropriate for this patient at this time.  I discussed the limitations, risks, security and privacy concerns of performing an evaluation and management service by telephone and the availability of in person appointments. I also discussed with the patient that there may be a patient responsible charge related to this service. The patient expressed understanding and agreed to proceed.  ? ? ?I connected with Phillip Paul on 04/09/21  at   2:30 PM EDT  EDT by telephone and verified that I am speaking with the correct person using two identifiers. ? ?Location of Patient: ?Private Residence ?  ?Location of Provider: ?Scientist, research (physical sciences) and [employer] Office  ?  ?Persons participating in Telemedicine visit: ?Phillip Rankins FNP-BC ?[name]  ?  ?History of Present Illness: ?Telemedicine visit for: abdominal pain ? ? ?Gastroenterology ?Needs GI referral. Poor colon prep 11-19-2017. Needs repeat colonoscopy. Currently endorses RUQ abdominal pain. He does have a history of gallbladder polyps not large enough to remove at this time. General surgery currently monitoring.  ?He denies diarrhea, constipation, hematochezia, melena or dark and tarry stools. Pain is colicky and described as burning and stabbing. He is currently taking zantac and we will switch to PPI today. Has history of +FOBT with one polyp removed however he was instructed to repeat colonoscopy in 3 months due to poor colon prep. This was in 2019. He was lost to follow up thereafter.  History of eradicated Hep C.  ? ? ?Chest pain ?Reports intermittent chest pain radiating at times from the left side of the chest to the right and sometimes waking him up out of his sleep. He does have increased stress related to family issues. Chest pain is sometimes associated with activity. He was previously walking  up to 8 miles per day however he states he has not been able to walk as much due to the intermittent chest pain that causes him to sometimes have to stop mid walk.Pain lasting minutes to hours.  ?He was evaluated in the ED in January for chest pain. It was recommended that e follow up with cardiology for Cardiac CT ?Phillip Paul office attempted to contact him to schedule however today he states at that time his chest pain had resolved so he did not schedule an appt.  ?Lab Results  ?Component Value Date  ? CHOL 135 12/22/2020  ? CHOL 167 06/14/2020  ? CHOL 143 05/11/2019  ? ?Lab Results  ?Component Value Date  ? HDL 44 12/22/2020  ? HDL 38 (L) 06/14/2020  ? HDL 46 05/11/2019  ? ?Lab Results  ?Component Value Date  ? [place] 68 12/22/2020  ? [place] 90 06/14/2020  ? [place] 82 05/11/2019  ? ?Lab Results  ?Component Value Date  ? TRIG 129 12/22/2020  ? TRIG 232 (H) 06/14/2020  ? TRIG 75 05/11/2019  ? ?Lab Results  ?Component Value Date  ? CHOLHDL 3.1 12/22/2020  ? CHOLHDL 4.4 06/14/2020  ? CHOLHDL 3.1 05/11/2019  ? ? ?BP Readings from Last 3 Encounters:  ?02/19/21 (!) 151/85  ?02/12/21 119/77  ?12/22/20 (!) 143/78  ?  ?Lab Results  ?Component Value Date  ? HGBA1C 7.4 (A) 12/22/2020  ?  ? ?Past Medical History:  ?Diagnosis Date  ? Abnormal EKG   ? Anxiety   ? Diabetes mellitus without complication ([place])   ? Dizzy   ? Hypertension   ?  Kidney stones   ? Racing heart beat   ?  ?Past Surgical History:  ?Procedure Laterality Date  ? CYSTOSCOPY    ? LEFT URETEROSCOPIC STONE MANIPULATION AND REMOVAL; PLACEMENT OF  LEFT DOUBLE-J URETERAL STENT  ? CYSTOSCOPY W/ URETERAL STENT PLACEMENT    ? LEFT DOUBLE-J URETERAL STENT  ? ORIF ANKLE FRACTURE Left 08/17/2012  ? Procedure: OPEN REDUCTION INTERNAL FIXATION (ORIF) ANKLE FRACTURE/Left;  Surgeon: Wylene Simmer, MD;  Location: [facility];  Service: Orthopedics;  Laterality: Left;  ? TIBIA IM NAIL INSERTION Left 08/17/2012  ? Procedure: INTRAMEDULLARY (IM) NAIL TIBIAL/Left;  Surgeon: Wylene Simmer,  MD;  Location: [facility];  Service: Orthopedics;  Laterality: Left;  ?  ?Family History  ?Problem Relation Age of Onset  ? Hypertension Father   ?     lived to 53  ? Other Brother   ?     MVA  ? Kidney disease Sister   ? Other Brother   ?     Stomach problems  ?  ?Social History  ? ?Socioeconomic History  ? Marital status: Single  ?  Spouse name: Not on file  ? Number of children: 1  ? Years of education: Not on file  ? Highest education level: Bachelor's degree (e.g., BA, AB, BS)  ?Occupational History  ? Occupation: retired  ?Tobacco Use  ? Smoking status: Every Day  ?  Packs/day: 0.25  ?  Types: Cigarettes  ? Smokeless tobacco: Never  ? Tobacco comments:  ?  States about 3 cigaretters per day, and is "processing quitting"  ?Vaping Use  ? Vaping Use: Never used  ?Substance and Sexual Activity  ? Alcohol use: Not Currently  ?  Comment: Pt. stated he have not drank in 4 years.   ? Drug use: No  ? Sexual activity: Yes  ?Other Topics Concern  ? Not on file  ?Social History Narrative  ? NO FAMILY HX TO REPORT FROM NEW RECORDS  ?   ? Patient is right-handed. He lives with his girlfriend. He drinks 2-3 cups or tea a day. He does not exercise.  ? Family originally from [place]  ? ?Social Determinants of Health  ? ?Financial Resource Strain: Not on file  ?Food Insecurity: Not on file  ?Transportation Needs: Not on file  ?Physical Activity: Not on file  ?Stress: Not on file  ?Social Connections: Not on file  ?  ? ?Observations/Objective: ?Awake, alert and oriented x 3 ? ? ?Review of Systems  ?Constitutional:  Negative for fever, malaise/fatigue and weight loss.  ?HENT: Negative.  Negative for nosebleeds.   ?Eyes: Negative.  Negative for blurred vision, double vision and photophobia.  ?Respiratory: Negative.  Negative for cough and shortness of breath.   ?Cardiovascular:  Positive for chest pain. Negative for palpitations and leg swelling.  ?Gastrointestinal:  Positive for abdominal pain and heartburn. Negative for nausea and  vomiting.  ?Musculoskeletal: Negative.  Negative for myalgias.  ?Neurological: Negative.  Negative for dizziness, focal weakness, seizures and headaches.  ?Psychiatric/Behavioral:  Negative for suicidal ideas. The patient is nervous/anxious.    ?Assessment and Plan: ?Diagnoses and all orders for this visit: ? ?Chest pain, unspecified type ?-     Ambulatory referral to Cardiology ? ?Colon cancer screening ?-     Ambulatory referral to Gastroenterology ? ?GERD without esophagitis ?-     omeprazole (PRILOSEC) 40 MG capsule; Take 1 capsule (40 mg total) by mouth daily. ? ?RUQ abdominal pain ?-     Ambulatory referral to  Gastroenterology ? ?Controlled type 2 diabetes mellitus with complication, with long-term current use of insulin (HCC) ?-     metFORMIN (GLUCOPHAGE) 500 MG tablet; Take 1 tablet (500 mg total) by mouth 2 (two) times daily with a meal. ? ?  ? ?Follow Up Instructions ?Return in about 3 months (around 07/10/2021).  ? ?  ?I discussed the assessment and treatment plan with the patient. The patient was provided an opportunity to ask questions and all were answered. The patient agreed with the plan and demonstrated an understanding of the instructions. ?  ?The patient was advised to call back or seek an in-person evaluation if the symptoms worsen or if the condition fails to improve as anticipated. ? ?I provided 17 minutes of non-face-to-face time during this encounter including median intraservice time, reviewing previous notes, labs, imaging, medications and explaining diagnosis and management. ? ?Gildardo Pounds, FNP-BC  ?

## 2021-04-26 ENCOUNTER — Encounter: Payer: Self-pay | Admitting: Nurse Practitioner

## 2021-05-02 ENCOUNTER — Ambulatory Visit: Payer: Medicare Other | Admitting: Nurse Practitioner

## 2021-05-24 ENCOUNTER — Other Ambulatory Visit: Payer: Self-pay | Admitting: Nurse Practitioner

## 2021-05-24 DIAGNOSIS — E118 Type 2 diabetes mellitus with unspecified complications: Secondary | ICD-10-CM

## 2021-05-30 ENCOUNTER — Ambulatory Visit (INDEPENDENT_AMBULATORY_CARE_PROVIDER_SITE_OTHER): Payer: Medicare Other | Admitting: Cardiology

## 2021-05-30 ENCOUNTER — Encounter: Payer: Self-pay | Admitting: Cardiology

## 2021-05-30 VITALS — BP 110/70 | HR 91 | Ht 70.0 in | Wt 204.0 lb

## 2021-05-30 DIAGNOSIS — F17200 Nicotine dependence, unspecified, uncomplicated: Secondary | ICD-10-CM | POA: Diagnosis not present

## 2021-05-30 DIAGNOSIS — R072 Precordial pain: Secondary | ICD-10-CM

## 2021-05-30 DIAGNOSIS — I10 Essential (primary) hypertension: Secondary | ICD-10-CM | POA: Diagnosis not present

## 2021-05-30 DIAGNOSIS — E782 Mixed hyperlipidemia: Secondary | ICD-10-CM | POA: Diagnosis not present

## 2021-05-30 DIAGNOSIS — Z01812 Encounter for preprocedural laboratory examination: Secondary | ICD-10-CM

## 2021-05-30 LAB — BASIC METABOLIC PANEL
BUN/Creatinine Ratio: 22 (ref 10–24)
BUN: 15 mg/dL (ref 8–27)
CO2: 24 mmol/L (ref 20–29)
Calcium: 9.4 mg/dL (ref 8.6–10.2)
Chloride: 100 mmol/L (ref 96–106)
Creatinine, Ser: 0.68 mg/dL — ABNORMAL LOW (ref 0.76–1.27)
Glucose: 93 mg/dL (ref 70–99)
Potassium: 4 mmol/L (ref 3.5–5.2)
Sodium: 138 mmol/L (ref 134–144)
eGFR: 100 mL/min/{1.73_m2} (ref >59)

## 2021-05-30 MED ORDER — METOPROLOL TARTRATE 100 MG PO TABS: 100.0000 mg | ORAL_TABLET | ORAL | 0 refills | Status: DC

## 2021-05-30 MED ORDER — ASPIRIN EC 81 MG PO TBEC: 81.0000 mg | DELAYED_RELEASE_TABLET | Freq: Every day | ORAL | 3 refills | Status: AC

## 2021-05-30 NOTE — Patient Instructions (Signed)
Medication Instructions:  ?Please start Aspirin 81 mg a day. ?Continue all other medications as lisetd. ? ?*If you need a refill on your cardiac medications before your next appointment, please call your pharmacy* ? ? ?Lab Work: ?Please have blood work today (BMP) ? ?If you have labs (blood work) drawn today and your tests are completely normal, you will receive your results only by: ?MyChart Message (if you have MyChart) OR ?A paper copy in the mail ?If you have any lab test that is abnormal or we need to change your treatment, we will call you to review the results. ? ? ?Testing/Procedures: ? ? ?Your cardiac CT will be scheduled at:  ? ?[facility] ?[address] ?[place], [place] [address] ?[phone] ? ?Please arrive at the [facility] and Children's Entrance (Entrance C2) of [facility] 30 minutes prior to test start time. ?You can use the FREE valet parking offered at entrance C (encouraged to control the heart rate for the test)  ?Proceed to the [facility] Radiology Department (first floor) to check-in and test prep. ? ?All radiology patients and guests should use entrance C2 at [facility], accessed from [facility], even though the hospital's physical address listed is [address]. ? ? ? ? ? ?Please follow these instructions carefully (unless otherwise directed): ? ?Hold all erectile dysfunction medications at least 3 days (72 hrs) prior to test. ? ?On the Night Before the Test: ?Be sure to Drink plenty of water. ?Do not consume any caffeinated/decaffeinated beverages or chocolate 12 hours prior to your test. ?Do not take any antihistamines 12 hours prior to your test. ? ?On the Day of the Test: ?Drink plenty of water until 1 hour prior to the test. ?Do not eat any food 4 hours prior to the test. ?You may take your regular medications prior to the test.  ?Take metoprolol (Lopressor) two hours prior to test.  Do not take your Cozaar this morning. ?HOLD  Furosemide/Hydrochlorothiazide morning of the test. ? ?After the Test: ?Drink plenty of water. ?After receiving IV contrast, you may experience a mild flushed feeling. This is normal. ?On occasion, you may experience a mild rash up to 24 hours after the test. This is not dangerous. If this occurs, you can take Benadryl 25 mg and increase your fluid intake. ?If you experience trouble breathing, this can be serious. If it is severe call 911 IMMEDIATELY. If it is mild, please call our office. ?If you take any of these medications: Glipizide/Metformin, Avandament, Glucavance, please do not take 48 hours after completing test unless otherwise instructed. ? ?We will call to schedule your test 2-4 weeks out understanding that some insurance companies will need an authorization prior to the service being performed.  ? ?For non-scheduling related questions, please contact the cardiac imaging nurse navigator should you have any questions/concerns: ?Marchia Bond, Cardiac Imaging Nurse Navigator ?Gordy Clement, Cardiac Imaging Nurse Navigator ?[facility] ?Direct Office Dial: [phone]  ? ?For scheduling needs, including cancellations and rescheduling, please call [place], [phone]. ? ? ?Follow-Up: ?At [facility], you and your health needs are our priority.  As part of our continuing mission to provide you with exceptional heart care, we have created designated Provider Care Teams.  These Care Teams include your primary Cardiologist (physician) and Advanced Practice Providers (APPs -  Physician Assistants and Nurse Practitioners) who all work together to provide you with the care you need, when you need it. ? ?  We recommend signing up for the patient portal called "MyChart".  Sign up information is provided on this After Visit Summary.  MyChart is used to connect with patients for Virtual Visits (Telemedicine).  Patients are able to view lab/test results, encounter notes, upcoming  appointments, etc.  Non-urgent messages can be sent to your provider as well.   ?To learn more about what you can do with MyChart, go to [email or web address].   ? ?Your next appointment:   ?Follow up will be based on the results of the above testing. ? ? ?Important Information About Sugar ? ? ? ? ?  ?

## 2021-05-30 NOTE — Assessment & Plan Note (Addendum)
Wake up from sleep. Slowly goes away. Under left breast but can radiate across chest. Could be stress, sister is doctor.  We will go ahead and check a coronary CT scan.  I did personally review and interpret his prior chest CT and he does have coronary calcification present.  He could have flow-limiting CAD.  Continue with Crestor 10 mg for now.  Also would be helpful for him to be on a low-dose aspirin 81 mg. ?

## 2021-05-30 NOTE — Progress Notes (Signed)
?Cardiology Office Note:   ? ?Date:  05/30/2021  ? ?ID:  Phillip Paul, DOB February 17, 1950, MRN [id number] ? ?PCP:  Gildardo Pounds, NP ?  ?[facility] Providers ?Cardiologist:  Candee Furbish, MD    ? ?Referring MD: Gildardo Pounds, NP  ? ?History of Present Illness:   ? ?Phillip Paul is a 71 y.o. male here for the evaluation of chest pain at the request of Geryl Rankins, NP. ? ?He saw Geryl Rankins, NP on 04/09/2021 and reported intermittent chest pain sometimes radiating from his left to right chest and waking him up from sleep. His chest pain could occur with walking, and he noted increased stress as well due to family issues. A Cardiac CT had been ordered after being seen in the ED 01/2021, but his chest pain had resolved at that time so he did not schedule the appointment. He was still having chest pain on 04/26/21 and requested a cardiology referral. ? ?Previously seen in the ED 02/12/2021 with complaint of chest pain intermittent for 3 days, both with exertion and at rest. Mass noted by radiology on CXR; Chest CT did not reveal any evidence of mass. Repeat troponin was negative. Recommended for outpatient cardiology work-up. ? ?Today: ?He notes having his chest pain for a while, but worsening a couple months ago prompting his ED visit 01/2021. Typically there is no change in his pain with movement. Lately his chest pain has been increasingly noticeable at night, waking him up from sleep. He will get up and make himself coffee, sitting until his pain resolves on its own. Of note, his pain is usually localized to his left chest, but when he wakes up at night he feels pain "all over his chest". ? ?Also, he complains of worsening pain in his left arm, close to his left elbow. Previously he felt numbness, but this progressed to pain. ? ?He walks daily for exercise, with some shortness of breath. Currently he is smoking 1/2 a cigarette to 2 cigarettes a day. He continues to work on quitting. ? ?He denies  any palpitations, or peripheral edema. No lightheadedness, headaches, syncope, orthopnea, or PND. ? ? ?Past Medical History:  ?Diagnosis Date  ? Abnormal EKG   ? Anxiety   ? Chest pain   ? Diabetes mellitus without complication ([place])   ? Dizzy   ? Hypertension   ? Kidney stones   ? Racing heart beat   ? ? ?Past Surgical History:  ?Procedure Laterality Date  ? CYSTOSCOPY    ? LEFT URETEROSCOPIC STONE MANIPULATION AND REMOVAL; PLACEMENT OF  LEFT DOUBLE-J URETERAL STENT  ? CYSTOSCOPY W/ URETERAL STENT PLACEMENT    ? LEFT DOUBLE-J URETERAL STENT  ? ORIF ANKLE FRACTURE Left 08/17/2012  ? Procedure: OPEN REDUCTION INTERNAL FIXATION (ORIF) ANKLE FRACTURE/Left;  Surgeon: Wylene Simmer, MD;  Location: [facility];  Service: Orthopedics;  Laterality: Left;  ? TIBIA IM NAIL INSERTION Left 08/17/2012  ? Procedure: INTRAMEDULLARY (IM) NAIL TIBIAL/Left;  Surgeon: Wylene Simmer, MD;  Location: [facility];  Service: Orthopedics;  Laterality: Left;  ? ? ?Current Medications: ?Current Meds  ?Medication Sig  ? amLODipine (NORVASC) 10 MG tablet Take 1 tablet by mouth once daily  ? aspirin EC 81 MG tablet Take 1 tablet (81 mg total) by mouth daily. Swallow whole.  ? Blood Glucose Monitoring Suppl (ONE TOUCH ULTRA 2) w/Device KIT Check blood sugar by fingerstick 2x a day. ICD10 E11.8 and E11.65  ? cholecalciferol (VITAMIN D) 1000 units tablet Take  1,000 Units by mouth daily.  ? COENZYME Q10 PO Take by mouth.  ? FARXIGA 10 MG TABS tablet Take 10 mg by mouth every morning.  ? fish oil-omega-3 fatty acids 1000 MG capsule Take 1 g by mouth daily.  ? fluticasone (FLONASE) 50 MCG/ACT nasal spray PLACE 2 SPRAYS INTO BOTH NOSTRILS DAILY.  ? glucose blood (ONETOUCH ULTRA) test strip USE AS DIRECTED TWICE DAILY. Dx E11.8  ? insulin detemir (LEVEMIR FLEXPEN) 100 UNIT/ML FlexPen Inject 10 Units into the skin 2 (two) times daily.  ? Insulin Pen Needle 31G X 5 MM MISC Use as instructed.  ? losartan (COZAAR) 100 MG tablet Take 1 tablet by mouth once daily  ? metFORMIN  (GLUCOPHAGE) 500 MG tablet Take 1 tablet (500 mg total) by mouth 2 (two) times daily with a meal.  ? metoprolol tartrate (LOPRESSOR) 100 MG tablet Take 1 tablet (100 mg total) by mouth as directed. Take one tablet (2) hours before your CT scan  ? [facility] ? ?Anti-fungal (nail)-#1  ? omeprazole (PRILOSEC) 40 MG capsule Take 1 capsule (40 mg total) by mouth daily.  ? rosuvastatin (CRESTOR) 10 MG tablet Take 1 tablet by mouth once daily  ? TRUEPLUS LANCETS 28G MISC Use as instructed  ?  ? ?Allergies:   Tylenol [acetaminophen]  ? ?Social History  ? ?Socioeconomic History  ? Marital status: Single  ?  Spouse name: Not on file  ? Number of children: 1  ? Years of education: Not on file  ? Highest education level: Bachelor's degree (e.g., BA, AB, BS)  ?Occupational History  ? Occupation: retired  ?Tobacco Use  ? Smoking status: Every Day  ?  Packs/day: 0.25  ?  Types: Cigarettes  ? Smokeless tobacco: Never  ? Tobacco comments:  ?  States about 3 cigaretters per day, and is "processing quitting"  ?Vaping Use  ? Vaping Use: Never used  ?Substance and Sexual Activity  ? Alcohol use: Not Currently  ?  Comment: Pt. stated he have not drank in 4 years.   ? Drug use: No  ? Sexual activity: Yes  ?Other Topics Concern  ? Not on file  ?Social History Narrative  ? NO FAMILY HX TO REPORT FROM NEW RECORDS  ?   ? Patient is right-handed. He lives with his girlfriend. He drinks 2-3 cups or tea a day. He does not exercise.  ? Family originally from [place]  ? ?Social Determinants of Health  ? ?Financial Resource Strain: Not on file  ?Food Insecurity: Not on file  ?Transportation Needs: Not on file  ?Physical Activity: Not on file  ?Stress: Not on file  ?Social Connections: Not on file  ?  ? ?Family History: ?The patient's family history includes Hypertension in his father; Kidney disease in his sister; Other in his brother and brother. ? ?ROS:   ?Please see the history of present illness.    ?(+) Chest pain ?(+)  Left elbow pain ?(+) Shortness of breath ?All other systems reviewed and are negative. ? ?EKGs/Labs/Other Studies Reviewed:   ? ?The following studies were reviewed today: ? ?CT Chest  02/12/2021: ?FINDINGS: ?Cardiovascular: No acute vascular findings are seen. There is ?moderate atherosclerosis of the aorta, great vessels and coronary ?arteries. The heart size is normal. There is no pericardial ?effusion. ?  ?Mediastinum/Nodes: There are no enlarged mediastinal, hilar or ?axillary lymph nodes. The thyroid gland, trachea and esophagus ?demonstrate no significant findings. ?  ?Lungs/Pleura: No pleural effusion or pneumothorax. Mild subpleural ?  reticulation and central airway thickening in both lungs with ?atelectasis or scarring medially in the right middle lobe. There is ?no airspace disease or suspicious pulmonary nodule. ?  ?Upper abdomen: No significant findings are seen within the ?visualized upper abdomen. ?  ?Musculoskeletal/Chest wall: There is no chest wall mass or ?suspicious osseous finding. Prominent paraspinal osteophytes on the ?right at T10-11 may account for the earlier radiographic finding. ?  ?IMPRESSION: ?1. No evidence of pulmonary or mediastinal mass. The radiographic ?finding may be due to paraspinal osteophytes. ?2. Scattered mild pulmonary scarring and central airway thickening. ?No acute chest findings. ?3. Coronary and Aortic Atherosclerosis (ICD10-I70.0). No acute ?vascular findings. ? ?Bilateral Carotid Doppler 11/26/2019: ?Summary:  ?Right Carotid: Velocities in the right ICA are consistent with a 1-39%  ?stenosis.  ? ?Left Carotid: Velocities in the left ICA are consistent with a 1-39%  ?stenosis.  ? ?Vertebrals:  Bilateral vertebral arteries demonstrate antegrade flow.  ?Subclavians: Normal flow hemodynamics were seen in bilateral subclavian arteries.  ? ? ?EKG:  EKG is personally reviewed and interpreted. ?05/30/2021: EKG was not ordered. ?02/12/2021 (ED): Normal sinus rhythm at 78 bpm.  Possible anterior infarct, age undetermined. When compared with ECG of 24-Mar-2017 questionable change in QRS axis. ? ? ?Recent Labs: ?12/22/2020: ALT 20 ?02/12/2021: BUN 17; Creatinine, Ser 0.65; Hemoglobi

## 2021-05-30 NOTE — Assessment & Plan Note (Signed)
Currently on Crestor 10 mg once a day.  His last LDL was 68.  Excellent.  ALT 20.  No myalgias. ?

## 2021-05-30 NOTE — Assessment & Plan Note (Signed)
Well-controlled on losartan as well as amlodipine 10 mg.  In fact, prior to coronary CT scan he will need Lopressor 100 mg to help decrease his pulse.  He can hold his losartan when he takes the metoprolol. ?

## 2021-05-30 NOTE — Assessment & Plan Note (Signed)
Discussed tobacco cessation 

## 2021-06-01 ENCOUNTER — Other Ambulatory Visit: Payer: Self-pay | Admitting: Nurse Practitioner

## 2021-06-01 DIAGNOSIS — I10 Essential (primary) hypertension: Secondary | ICD-10-CM

## 2021-06-18 ENCOUNTER — Telehealth (HOSPITAL_COMMUNITY): Payer: Self-pay | Admitting: Emergency Medicine

## 2021-06-18 NOTE — Telephone Encounter (Signed)
Reaching out to patient to offer assistance regarding upcoming cardiac imaging study; pt verbalizes understanding of appt date/time, parking situation and where to check in, pre-test NPO status and medications ordered, and verified current allergies; name and call back number provided for further questions should they arise Marchia Bond RN Navigator Cardiac Imaging [facility] [phone] office [phone] cell  "Diabetic veins" - difficult veins '100mg'$  metoprolol tartrate  Arrival 800

## 2021-06-19 ENCOUNTER — Other Ambulatory Visit: Payer: Self-pay | Admitting: Family Medicine

## 2021-06-19 DIAGNOSIS — Z794 Long term (current) use of insulin: Secondary | ICD-10-CM

## 2021-06-19 MED ORDER — ONETOUCH ULTRA VI STRP: ORAL_STRIP | 0 refills | Status: DC

## 2021-06-20 ENCOUNTER — Ambulatory Visit (HOSPITAL_BASED_OUTPATIENT_CLINIC_OR_DEPARTMENT_OTHER)
Admission: RE | Admit: 2021-06-20 | Discharge: 2021-06-20 | Disposition: A | Discharge status: Home / Self Care | Payer: Medicare Other | Source: Ambulatory Visit | Attending: Cardiology | Admitting: Cardiology

## 2021-06-20 ENCOUNTER — Other Ambulatory Visit: Payer: Self-pay | Admitting: Cardiology

## 2021-06-20 ENCOUNTER — Ambulatory Visit (HOSPITAL_COMMUNITY)
Admission: RE | Admit: 2021-06-20 | Discharge: 2021-06-20 | Disposition: A | Discharge status: Home / Self Care | Payer: Medicare Other | Source: Ambulatory Visit | Attending: Cardiology | Admitting: Cardiology

## 2021-06-20 DIAGNOSIS — R931 Abnormal findings on diagnostic imaging of heart and coronary circulation: Secondary | ICD-10-CM | POA: Diagnosis not present

## 2021-06-20 DIAGNOSIS — R072 Precordial pain: Secondary | ICD-10-CM | POA: Diagnosis not present

## 2021-06-20 DIAGNOSIS — I2510 Atherosclerotic heart disease of native coronary artery without angina pectoris: Secondary | ICD-10-CM | POA: Diagnosis not present

## 2021-06-20 MED ORDER — IOHEXOL 350 MG/ML SOLN
100.0000 mL | Freq: Once | INTRAVENOUS | Status: AC | PRN
  Administered 2021-06-20: 100 mL via INTRAVENOUS

## 2021-06-20 MED ORDER — NITROGLYCERIN 0.4 MG SL SUBL
SUBLINGUAL_TABLET | SUBLINGUAL | Status: AC
  Filled 2021-06-20: qty 2

## 2021-06-20 MED ORDER — NITROGLYCERIN 0.4 MG SL SUBL
0.8000 mg | SUBLINGUAL_TABLET | Freq: Once | SUBLINGUAL | Status: AC
Start: 2021-06-20 — End: 2021-06-20
  Administered 2021-06-20: 0.8 mg via SUBLINGUAL

## 2021-06-21 ENCOUNTER — Encounter: Payer: Self-pay | Admitting: Cardiology

## 2021-06-22 ENCOUNTER — Telehealth: Payer: Self-pay | Admitting: *Deleted

## 2021-06-22 MED ORDER — ROSUVASTATIN CALCIUM 20 MG PO TABS: 20.0000 mg | ORAL_TABLET | Freq: Every day | ORAL | 3 refills | Status: DC

## 2021-06-22 NOTE — Telephone Encounter (Signed)
Coronary calcium score: The patient's coronary artery calcium score  is 2247, which places the patient in the 94th percentile.   Moderate CAD. Diffuse mixed calcified and  noncalcified plaque throughout proximal and mid LAD, area most  affected has length of 4.4 cm. There are several areas of focal  stenosis in the proximal LAD, most significant 50-69% stenosis.  There is a stenosis in the mid LAD of 50-69%. Scattered plaque  throughout other coronary arteries with <50% stenosis. CT FFR does not show any flow limiting disease.   Let's increase Crestor to '20mg'$  once a day to help stabilize plaque further. Once again work on smoking cessation.   Candee Furbish, MD

## 2021-06-29 ENCOUNTER — Other Ambulatory Visit: Payer: Self-pay | Admitting: Family Medicine

## 2021-06-29 DIAGNOSIS — I10 Essential (primary) hypertension: Secondary | ICD-10-CM

## 2021-07-05 ENCOUNTER — Other Ambulatory Visit: Payer: Self-pay | Admitting: Nurse Practitioner

## 2021-07-14 ENCOUNTER — Encounter: Payer: Self-pay | Admitting: Nurse Practitioner

## 2021-07-15 ENCOUNTER — Other Ambulatory Visit: Payer: Self-pay | Admitting: Nurse Practitioner

## 2021-07-15 MED ORDER — GABAPENTIN 600 MG PO TABS: 300.0000 mg | ORAL_TABLET | Freq: Every day | ORAL | 0 refills | Status: DC

## 2021-07-26 ENCOUNTER — Other Ambulatory Visit: Payer: Self-pay | Admitting: Nurse Practitioner

## 2021-07-26 DIAGNOSIS — K219 Gastro-esophageal reflux disease without esophagitis: Secondary | ICD-10-CM

## 2021-07-26 NOTE — Telephone Encounter (Signed)
Requested Prescriptions  Pending Prescriptions Disp Refills  . omeprazole (PRILOSEC) 40 MG capsule [Pharmacy Med Name: Omeprazole 40 MG Oral Capsule Delayed Release] 30 capsule 5    Sig: Take 1 capsule by mouth once daily     Gastroenterology: Proton Pump Inhibitors Passed - 07/26/2021 10:04 AM      Passed - Valid encounter within last 12 months    Recent Outpatient Visits          3 months ago Chest pain, unspecified type   [place], [place] W, NP   7 months ago Controlled type 2 diabetes mellitus with complication, with long-term current use of insulin [facility])   [place], [place] W, NP   10 months ago Controlled type 2 diabetes mellitus with complication, with long-term current use of insulin [facility])   [place], Vernia Buff, NP   1 year ago Buttocks nodule   [place], [place] W, NP   1 year ago Controlled type 2 diabetes mellitus with complication, with long-term current use of insulin [facility])   [place], Vernia Buff, NP      Future Appointments            In 3 weeks Gildardo Pounds, NP [place]

## 2021-08-05 ENCOUNTER — Other Ambulatory Visit: Payer: Self-pay | Admitting: Family Medicine

## 2021-08-05 DIAGNOSIS — I10 Essential (primary) hypertension: Secondary | ICD-10-CM

## 2021-08-07 NOTE — Telephone Encounter (Signed)
Requested medication (s) are due for refill today: yes  Requested medication (s) are on the active medication list: yes  Last refill:  07/05/21 #30/0  Future visit scheduled: yes   Notes to clinic:  Unable to refill per protocol due to failed labs, no updated results.      Requested Prescriptions  Pending Prescriptions Disp Refills   FARXIGA 10 MG TABS tablet [Pharmacy Med Name: Farxiga 10 MG Oral Tablet] 30 tablet 0    Sig: TAKE 1 TABLET BY MOUTH ONCE DAILY BEFORE BREAKFAST . APPOINTMENT REQUIRED FOR FUTURE REFILLS     Endocrinology:  Diabetes - SGLT2 Inhibitors Failed - 08/05/2021 12:42 PM      Failed - Cr in normal range and within 360 days    Creat  Date Value Ref Range Status  08/09/2019 0.77 0.70 - 1.25 mg/dL Final    Comment:    For patients >58 years of age, the reference limit for Creatinine is approximately 13% higher for people identified as African-American. .    Creatinine, Ser  Date Value Ref Range Status  05/30/2021 0.68 (L) 0.76 - 1.27 mg/dL Final         Failed - HBA1C is between 0 and 7.9 and within 180 days    Hemoglobin A1C  Date Value Ref Range Status  12/22/2020 7.4 (A) 4.0 - 5.6 % Final   HbA1c, POC (controlled diabetic range)  Date Value Ref Range Status  02/13/2018 7.2 (A) 0.0 - 7.0 % Final   Hgb A1c MFr Bld  Date Value Ref Range Status  06/14/2020 7.9 (H) 4.8 - 5.6 % Final    Comment:             Prediabetes: 5.7 - 6.4          Diabetes: >6.4          Glycemic control for adults with diabetes: <7.0          Failed - Valid encounter within last 6 months    Recent Outpatient Visits           4 months ago Chest pain, unspecified type   [place], [place] W, NP   7 months ago Controlled type 2 diabetes mellitus with complication, with long-term current use of insulin [facility])   [place], [place] W, NP   10 months ago Controlled type 2 diabetes mellitus with  complication, with long-term current use of insulin [facility])   [place], Vernia Buff, NP   1 year ago Buttocks nodule   [place], [place] W, NP   1 year ago Controlled type 2 diabetes mellitus with complication, with long-term current use of insulin [facility])   [place], Vernia Buff, NP       Future Appointments             In 2 weeks Gildardo Pounds, NP [place] - eGFR in normal range and within 360 days    GFR, Est African American  Date Value Ref Range Status  06/22/2013 >89 mL/min Final   GFR calc Af Amer  Date Value Ref Range Status  02/23/2020 109 >59 mL/min/1.73 Final    Comment:    **In accordance with recommendations from the NKF-ASN Task force,**   [facility] is in  the process of updating its eGFR calculation to the   2021 CKD-EPI creatinine equation that estimates kidney function   without a race variable.    GFR, Est Non African American  Date Value Ref Range Status  06/22/2013 >89 mL/min Final    Comment:      The estimated GFR is a calculation valid for adults (>=77 years old) that uses the CKD-EPI algorithm to adjust for age and sex. It is   not to be used for children, pregnant women, hospitalized patients,    patients on dialysis, or with rapidly changing kidney function. According to the NKDEP, eGFR >89 is normal, 60-89 shows mild impairment, 30-59 shows moderate impairment, 15-29 shows severe impairment and <15 is ESRD.     GFR, Estimated  Date Value Ref Range Status  02/12/2021 >60 >60 mL/min Final    Comment:    (NOTE) Calculated using the CKD-EPI Creatinine Equation (2021)    GFR  Date Value Ref Range Status  03/28/2017 115.83 >60.00 mL/min Final   eGFR  Date Value Ref Range Status  05/30/2021 100 >59 mL/min/1.73 Final         Signed Prescriptions Disp Refills   amLODipine  (NORVASC) 10 MG tablet 90 tablet 0    Sig: Take 1 tablet by mouth once daily     Cardiovascular: Calcium Channel Blockers 2 Failed - 08/05/2021 12:42 PM      Failed - Valid encounter within last 6 months    Recent Outpatient Visits           4 months ago Chest pain, unspecified type   [place], Vernia Buff, NP   7 months ago Controlled type 2 diabetes mellitus with complication, with long-term current use of insulin [facility])   [place], [place] W, NP   10 months ago Controlled type 2 diabetes mellitus with complication, with long-term current use of insulin [facility])   [place], Vernia Buff, NP   1 year ago Buttocks nodule   [place], [place] W, NP   1 year ago Controlled type 2 diabetes mellitus with complication, with long-term current use of insulin [facility])   [place], Vernia Buff, NP       Future Appointments             In 2 weeks Gildardo Pounds, NP [place] - Last BP in normal range    BP Readings from Last 1 Encounters:  06/20/21 110/68         Passed - Last Heart Rate in normal range    Pulse Readings from Last 1 Encounters:  06/20/21 (!) 58

## 2021-08-07 NOTE — Telephone Encounter (Signed)
Requested Prescriptions  Pending Prescriptions Disp Refills  . amLODipine (NORVASC) 10 MG tablet [Pharmacy Med Name: amLODIPine Besylate 10 MG Oral Tablet] 90 tablet 0    Sig: Take 1 tablet by mouth once daily     Cardiovascular: Calcium Channel Blockers 2 Failed - 08/05/2021 12:42 PM      Failed - Valid encounter within last 6 months    Recent Outpatient Visits          4 months ago Chest pain, unspecified type   [place] [facility] [place], Zelda W, NP   7 months ago Controlled type 2 diabetes mellitus with complication, with long-term current use of insulin (HCC)   [place] [facility] [place], Zelda W, NP   10 months ago Controlled type 2 diabetes mellitus with complication, with long-term current use of insulin (HCC)   [place] [facility] [place], Zelda W, NP   1 year ago Buttocks nodule   [place] [facility] Fleming, Zelda W, NP   1 year ago Controlled type 2 diabetes mellitus with complication, with long-term current use of insulin (HCC)   [place] [facility] Fleming, Zelda W, NP      Future Appointments            In 2 weeks Fleming, Zelda W, NP [place] [facility]           Passed - Last BP in normal range    BP Readings from Last 1 Encounters:  06/20/21 110/68         Passed - Last Heart Rate in normal range    Pulse Readings from Last 1 Encounters:  06/20/21 (!) 58         . FARXIGA 10 MG TABS tablet [Pharmacy Med Name: Farxiga 10 MG Oral Tablet] 30 tablet 0    Sig: TAKE 1 TABLET BY MOUTH ONCE DAILY BEFORE BREAKFAST . APPOINTMENT REQUIRED FOR FUTURE REFILLS     Endocrinology:  Diabetes - SGLT2 Inhibitors Failed - 08/05/2021 12:42 PM      Failed - Cr in normal range and within 360 days    Creat  Date Value Ref Range Status  08/09/2019 0.77 0.70 - 1.25 mg/dL Final    Comment:    For patients >49 years of age, the reference  limit for Creatinine is approximately 13% higher for people identified as African-American. .    Creatinine, Ser  Date Value Ref Range Status  05/30/2021 0.68 (L) 0.76 - 1.27 mg/dL Final         Failed - HBA1C is between 0 and 7.9 and within 180 days    Hemoglobin A1C  Date Value Ref Range Status  12/22/2020 7.4 (A) 4.0 - 5.6 % Final   HbA1c, POC (controlled diabetic range)  Date Value Ref Range Status  02/13/2018 7.2 (A) 0.0 - 7.0 % Final   Hgb A1c MFr Bld  Date Value Ref Range Status  06/14/2020 7.9 (H) 4.8 - 5.6 % Final    Comment:             Prediabetes: 5.7 - 6.4          Diabetes: >6.4          Glycemic control for adults with diabetes: <7.0          Failed - Valid encounter within last 6 months    Recent Outpatient Visits            4 months ago Chest pain, unspecified type   [place] [facility] Fleming, Zelda W, NP   7 months ago Controlled type 2 diabetes mellitus with complication, with long-term current use of insulin (HCC)   [place] [facility] Fleming, Zelda W, NP   10 months ago Controlled type 2 diabetes mellitus with complication, with long-term current use of insulin (HCC)   [place] [facility] Fleming, Zelda W, NP   1 year ago Buttocks nodule   [place] [facility] Fleming, Zelda W, NP   1 year ago Controlled type 2 diabetes mellitus with complication, with long-term current use of insulin (HCC)   [place] [facility] Fleming, Zelda W, NP      Future Appointments            In 2 weeks Fleming, Zelda W, NP  [facility]           Passed - eGFR in normal range and within 360 days    GFR, Est African American  Date Value Ref Range Status  06/22/2013 >89 mL/min Final   GFR calc Af Amer  Date Value Ref Range Status  02/23/2020 109 >59 mL/min/1.73 Final    Comment:    **In accordance with recommendations from  the NKF-ASN Task force,**   [facility] is in the process of updating its eGFR calculation to the   2021 CKD-EPI creatinine equation that estimates kidney function   without a race variable.    GFR, Est Non African American  Date Value Ref Range Status  06/22/2013 >89 mL/min Final    Comment:      The estimated GFR is a calculation valid for adults (>=18 years old) that uses the CKD-EPI algorithm to adjust for age and sex. It is   not to be used for children, pregnant women, hospitalized patients,    patients on dialysis, or with rapidly changing kidney function. According to the NKDEP, eGFR >89 is normal, 60-89 shows mild impairment, 30-59 shows moderate impairment, 15-29 shows severe impairment and <15 is ESRD.     GFR, Estimated  Date Value Ref Range Status  02/12/2021 >60 >60 mL/min Final    Comment:    (NOTE) Calculated using the CKD-EPI Creatinine Equation (2021)    GFR  Date Value Ref Range Status  03/28/2017 115.83 >60.00 mL/min Final   eGFR  Date Value Ref Range Status  05/30/2021 100 >59 mL/min/1.73 Final           

## 2021-08-10 ENCOUNTER — Other Ambulatory Visit: Payer: Self-pay | Admitting: Nurse Practitioner

## 2021-08-13 NOTE — Telephone Encounter (Signed)
Requested Prescriptions  Pending Prescriptions Disp Refills  . gabapentin (NEURONTIN) 600 MG tablet [Pharmacy Med Name: Gabapentin 600 MG Oral Tablet] 15 tablet 0    Sig: TAKE 1/2 (ONE-HALF) TABLET BY MOUTH AT BEDTIME DO  NOT  TAKE  A  WHOLE  TABLET  UNLESS  INSTRUCTED  BY  PCP     Neurology: Anticonvulsants - gabapentin Failed - 08/10/2021  6:26 PM      Failed - Cr in normal range and within 360 days    Creat  Date Value Ref Range Status  08/09/2019 0.77 0.70 - 1.25 mg/dL Final    Comment:    For patients >46 years of age, the reference limit for Creatinine is approximately 13% higher for people identified as African-American. .    Creatinine, Ser  Date Value Ref Range Status  05/30/2021 0.68 (L) 0.76 - 1.27 mg/dL Final         Passed - Completed PHQ-2 or PHQ-9 in the last 360 days      Passed - Valid encounter within last 12 months    Recent Outpatient Visits          4 months ago Chest pain, unspecified type   [place], [place] W, NP   7 months ago Controlled type 2 diabetes mellitus with complication, with long-term current use of insulin [facility])   [place], [place] W, NP   10 months ago Controlled type 2 diabetes mellitus with complication, with long-term current use of insulin [facility])   [place], Vernia Buff, NP   1 year ago Buttocks nodule   [place], [place] W, NP   1 year ago Controlled type 2 diabetes mellitus with complication, with long-term current use of insulin [facility])   [place], Vernia Buff, NP      Future Appointments            In 1 week Gildardo Pounds, NP [place]

## 2021-08-22 ENCOUNTER — Encounter: Payer: Self-pay | Admitting: Nurse Practitioner

## 2021-08-22 ENCOUNTER — Ambulatory Visit: Payer: Medicare Other | Attending: Nurse Practitioner | Admitting: Nurse Practitioner

## 2021-08-22 ENCOUNTER — Other Ambulatory Visit: Payer: Self-pay

## 2021-08-22 VITALS — BP 147/83 | HR 74 | Temp 98.2°F | Ht 70.0 in | Wt 212.8 lb

## 2021-08-22 DIAGNOSIS — Z0000 Encounter for general adult medical examination without abnormal findings: Secondary | ICD-10-CM | POA: Diagnosis not present

## 2021-08-22 DIAGNOSIS — E118 Type 2 diabetes mellitus with unspecified complications: Secondary | ICD-10-CM | POA: Diagnosis not present

## 2021-08-22 DIAGNOSIS — E1142 Type 2 diabetes mellitus with diabetic polyneuropathy: Secondary | ICD-10-CM | POA: Insufficient documentation

## 2021-08-22 DIAGNOSIS — Z79899 Other long term (current) drug therapy: Secondary | ICD-10-CM | POA: Diagnosis not present

## 2021-08-22 DIAGNOSIS — R42 Dizziness and giddiness: Secondary | ICD-10-CM | POA: Diagnosis not present

## 2021-08-22 DIAGNOSIS — I10 Essential (primary) hypertension: Secondary | ICD-10-CM | POA: Diagnosis not present

## 2021-08-22 DIAGNOSIS — Z7984 Long term (current) use of oral hypoglycemic drugs: Secondary | ICD-10-CM | POA: Insufficient documentation

## 2021-08-22 DIAGNOSIS — Z794 Long term (current) use of insulin: Secondary | ICD-10-CM | POA: Insufficient documentation

## 2021-08-22 MED ORDER — FLUTICASONE PROPIONATE 50 MCG/ACT NA SUSP: 2.0000 | Freq: Every day | NASAL | 2 refills | Status: DC

## 2021-08-22 MED ORDER — METFORMIN HCL 500 MG PO TABS: 500.0000 mg | ORAL_TABLET | Freq: Two times a day (BID) | ORAL | 1 refills | Status: DC

## 2021-08-22 MED ORDER — GABAPENTIN 600 MG PO TABS
300.0000 mg | ORAL_TABLET | Freq: Three times a day (TID) | ORAL | 1 refills | Status: DC
Start: 2021-08-22 — End: 2021-11-26

## 2021-08-22 NOTE — Progress Notes (Signed)
Assessment & Plan:  Phillip Paul was seen today for annual exam.  Diagnoses and all orders for this visit:  Encounter for annual physical exam  Controlled type 2 diabetes mellitus with complication, with long-term current use of insulin ([place]) -     Hemoglobin A1c -     CMP14+EGFR -     Ambulatory referral to Ophthalmology -     metFORMIN (GLUCOPHAGE) 500 MG tablet; Take 1 tablet (500 mg total) by mouth 2 (two) times daily with a meal.  Diabetic polyneuropathy associated with type 2 diabetes mellitus (HCC) -     gabapentin (NEURONTIN) 600 MG tablet; Take 0.5 tablets (300 mg total) by mouth 3 (three) times daily.  Vertigo -     fluticasone (FLONASE) 50 MCG/ACT nasal spray; Place 2 sprays into both nostrils daily.    Patient has been counseled on age-appropriate routine health concerns for screening and prevention. These are reviewed and up-to-date. Referrals have been placed accordingly. Immunizations are up-to-date or declined.    Subjective:   Chief Complaint  Patient presents with   Annual Exam   HPI Phillip Paul 71 y.o. male presents to office today for annual physical.    HTN Blood pressure is elevated today. He endorses increased stress from family conflict with his daughter. Stopped smoking 6 weeks ago but states he did smoke a few cigarettes recently after getting upset with his daughter.  Endorses intermittent left sided chest pain with left arm tingling today. EKG in office was essentially normal aside from mild hypertrophy. His crestor was recently increased due to cardiac CT results showing increased plaque.  BP Readings from Last 3 Encounters:  08/22/21 (!) 147/83  06/20/21 110/68  05/30/21 110/70    DM 2 Not quite at goal. He is currently taking farxiga 10 mg daily and metformin 500 mg daily. He does endorse diabetic polyneuropathy mostly in his bilateral feet. I have instructed him to increase gabapentin at this time.  Lab Results  Component  Value Date   HGBA1C 7.4 (A) 12/22/2020     Review of Systems  Constitutional:  Negative for fever, malaise/fatigue and weight loss.  HENT:  Positive for ear pain. Negative for nosebleeds.   Eyes: Negative.  Negative for blurred vision, double vision and photophobia.  Respiratory: Negative.  Negative for cough and shortness of breath.   Cardiovascular: Negative.  Negative for chest pain, palpitations and leg swelling.  Gastrointestinal: Negative.  Negative for heartburn, nausea and vomiting.  Genitourinary: Negative.   Musculoskeletal: Negative.  Negative for myalgias.  Skin: Negative.   Neurological: Negative.  Negative for dizziness, focal weakness, seizures and headaches.  Endo/Heme/Allergies: Negative.   Psychiatric/Behavioral: Negative.  Negative for suicidal ideas.     Past Medical History:  Diagnosis Date   Abnormal EKG    Anxiety    Chest pain    Diabetes mellitus without complication (HCC)    Dizzy    Hypertension    Kidney stones    Racing heart beat     Past Surgical History:  Procedure Laterality Date   CYSTOSCOPY     LEFT URETEROSCOPIC STONE MANIPULATION AND REMOVAL; PLACEMENT OF  LEFT DOUBLE-J URETERAL STENT   CYSTOSCOPY W/ URETERAL STENT PLACEMENT     LEFT DOUBLE-J URETERAL STENT   ORIF ANKLE FRACTURE Left 08/17/2012   Procedure: OPEN REDUCTION INTERNAL FIXATION (ORIF) ANKLE FRACTURE/Left;  Surgeon: Wylene Simmer, MD;  Location: [place];  Service: Orthopedics;  Laterality: Left;   TIBIA IM NAIL INSERTION Left 08/17/2012  Procedure: INTRAMEDULLARY (IM) NAIL TIBIAL/Left;  Surgeon: Wylene Simmer, MD;  Location: [place];  Service: Orthopedics;  Laterality: Left;    Family History  Problem Relation Age of Onset   Hypertension Father        lived to 30   Other Brother        MVA   Kidney disease Sister    Other Brother        Stomach problems    Social History Reviewed with no changes to be made today.   Outpatient Medications Prior to Visit  Medication Sig  Dispense Refill   amLODipine (NORVASC) 10 MG tablet Take 1 tablet by mouth once daily 90 tablet 0   aspirin EC 81 MG tablet Take 1 tablet (81 mg total) by mouth daily. Swallow whole. 90 tablet 3   Blood Glucose Monitoring Suppl (ONE TOUCH ULTRA 2) w/Device KIT Check blood sugar by fingerstick 2x a day. ICD10 E11.8 and E11.65 1 each 0   cholecalciferol (VITAMIN D) 1000 units tablet Take 1,000 Units by mouth daily.     COENZYME Q10 PO Take by mouth.     FARXIGA 10 MG TABS tablet TAKE 1 TABLET BY MOUTH ONCE DAILY BEFORE BREAKFAST . APPOINTMENT REQUIRED FOR FUTURE REFILLS 30 tablet 0   fish oil-omega-3 fatty acids 1000 MG capsule Take 1 g by mouth daily.     glucose blood (ONETOUCH ULTRA) test strip USE AS DIRECTED TWICE DAILY. Dx E11.8 200 each 0   insulin detemir (LEVEMIR FLEXPEN) 100 UNIT/ML FlexPen Inject 10 Units into the skin 2 (two) times daily. 15 mL 0   Insulin Pen Needle 31G X 5 MM MISC Use as instructed. 100 each 12   losartan (COZAAR) 100 MG tablet Take 1 tablet (100 mg total) by mouth daily. 90 tablet 0   metoprolol tartrate (LOPRESSOR) 100 MG tablet Take 1 tablet (100 mg total) by mouth as directed. Take one tablet (2) hours before your CT scan 1 tablet 0   NON FORMULARY [facility]  Anti-fungal (nail)-#1     omeprazole (PRILOSEC) 40 MG capsule Take 1 capsule by mouth once daily 30 capsule 5   rosuvastatin (CRESTOR) 20 MG tablet Take 1 tablet (20 mg total) by mouth daily. 90 tablet 3   TRUEPLUS LANCETS 28G MISC Use as instructed 100 each 3   fluticasone (FLONASE) 50 MCG/ACT nasal spray PLACE 2 SPRAYS INTO BOTH NOSTRILS DAILY. 16 g 2   gabapentin (NEURONTIN) 600 MG tablet TAKE 1/2 (ONE-HALF) TABLET BY MOUTH AT BEDTIME DO  NOT  TAKE  A  WHOLE  TABLET  UNLESS  INSTRUCTED  BY  PCP 15 tablet 0   metFORMIN (GLUCOPHAGE) 500 MG tablet Take 1 tablet (500 mg total) by mouth 2 (two) times daily with a meal. 180 tablet 1   No facility-administered medications prior to visit.     Allergies  Allergen Reactions   Tylenol [Acetaminophen] Other (See Comments)    ELEVATED LFTs       Objective:    BP (!) 147/83   Pulse 74   Temp 98.2 F (36.8 C) (Oral)   Ht _0  (1.778 m)   Wt 212 lb 12.8 oz (96.5 kg)   SpO2 94%   BMI 30.53 kg/m  Wt Readings from Last 3 Encounters:  08/22/21 212 lb 12.8 oz (96.5 kg)  05/30/21 204 lb (92.5 kg)  02/19/21 204 lb (92.5 kg)    Physical Exam Constitutional:      Appearance: He is well-developed.  HENT:     Head: Normocephalic and atraumatic.     Right Ear: Hearing, ear canal and external ear normal. A middle ear effusion is present.     Left Ear: Hearing, ear canal and external ear normal. A middle ear effusion is present.     Nose: Nose normal. No mucosal edema or rhinorrhea.     Right Turbinates: Not enlarged.     Left Turbinates: Not enlarged.     Mouth/Throat:     Lips: Pink.     Mouth: Mucous membranes are moist.     Dentition: No gingival swelling, dental abscesses or gum lesions.     Pharynx: Uvula midline.     Tonsils: No tonsillar exudate. 1+ on the right. 1+ on the left.  Eyes:     General: Lids are normal. No scleral icterus.    Extraocular Movements: Extraocular movements intact.     Conjunctiva/sclera: Conjunctivae normal.     Pupils: Pupils are equal, round, and reactive to light.  Neck:     Thyroid: No thyromegaly.     Trachea: No tracheal deviation.  Cardiovascular:     Rate and Rhythm: Normal rate and regular rhythm.     Heart sounds: Normal heart sounds. No murmur heard.    No friction rub. No gallop.  Pulmonary:     Effort: Pulmonary effort is normal. No respiratory distress.     Breath sounds: Normal breath sounds. No wheezing or rales.  Chest:     Chest wall: No mass or tenderness.  Breasts:    Right: No inverted nipple, mass, nipple discharge, skin change or tenderness.     Left: No inverted nipple, mass, nipple discharge, skin change or tenderness.  Abdominal:     General: Bowel  sounds are normal. There is distension.     Palpations: Abdomen is soft. There is no mass.     Tenderness: There is no abdominal tenderness. There is no right CVA tenderness, left CVA tenderness, guarding or rebound.     Hernia: No hernia is present.  Musculoskeletal:        General: No tenderness or deformity. Normal range of motion.     Cervical back: Normal range of motion and neck supple.  Lymphadenopathy:     Cervical: No cervical adenopathy.  Skin:    General: Skin is warm and dry.     Capillary Refill: Capillary refill takes less than 2 seconds.     Findings: No erythema.  Neurological:     Mental Status: He is alert and oriented to person, place, and time.     Cranial Nerves: No cranial nerve deficit.     Sensory: Sensation is intact.     Motor: No abnormal muscle tone.     Coordination: Coordination is intact. Coordination normal.     Gait: Gait is intact.     Deep Tendon Reflexes: Reflexes normal.     Reflex Scores:      Patellar reflexes are 1+ on the right side and 1+ on the left side. Psychiatric:        Attention and Perception: Attention normal.        Mood and Affect: Mood normal.        Speech: Speech normal.        Behavior: Behavior normal.        Thought Content: Thought content normal.        Judgment: Judgment normal.          Patient has been counseled  extensively about nutrition and exercise as well as the importance of adherence with medications and regular follow-up. The patient was given clear instructions to go to ER or return to medical center if symptoms don't improve, worsen or new problems develop. The patient verbalized understanding.   Follow-up: Return in about 3 months (around 11/22/2021).   Gildardo Pounds, FNP-BC [facility] [employer] [place], [place]   08/22/2021, 3:18 PM

## 2021-08-22 NOTE — Progress Notes (Signed)
Pt wants to discuss Lt arm numbness & pain Pt also c/o of Lt side chest pain

## 2021-08-23 ENCOUNTER — Other Ambulatory Visit: Payer: Self-pay | Admitting: Nurse Practitioner

## 2021-08-23 LAB — CMP14+EGFR
ALT: 30 IU/L (ref 0–44)
AST: 23 IU/L (ref 0–40)
Albumin/Globulin Ratio: 1.7 (ref 1.2–2.2)
Albumin: 4.8 g/dL (ref 3.8–4.8)
Alkaline Phosphatase: 81 IU/L (ref 44–121)
BUN/Creatinine Ratio: 23 (ref 10–24)
BUN: 18 mg/dL (ref 8–27)
Bilirubin Total: 0.3 mg/dL (ref 0.0–1.2)
CO2: 21 mmol/L (ref 20–29)
Calcium: 9.9 mg/dL (ref 8.6–10.2)
Chloride: 100 mmol/L (ref 96–106)
Creatinine, Ser: 0.77 mg/dL (ref 0.76–1.27)
Globulin, Total: 2.9 g/dL (ref 1.5–4.5)
Glucose: 116 mg/dL — ABNORMAL HIGH (ref 70–99)
Potassium: 4.3 mmol/L (ref 3.5–5.2)
Sodium: 137 mmol/L (ref 134–144)
Total Protein: 7.7 g/dL (ref 6.0–8.5)
eGFR: 96 mL/min/{1.73_m2} (ref >59)

## 2021-08-23 LAB — HEMOGLOBIN A1C
Est. average glucose Bld gHb Est-mCnc: 177 mg/dL
Hgb A1c MFr Bld: 7.8 % — ABNORMAL HIGH (ref 4.8–5.6)

## 2021-08-29 ENCOUNTER — Encounter: Payer: Self-pay | Admitting: Nurse Practitioner

## 2021-08-29 ENCOUNTER — Other Ambulatory Visit: Payer: Self-pay | Admitting: Nurse Practitioner

## 2021-08-29 DIAGNOSIS — Z794 Long term (current) use of insulin: Secondary | ICD-10-CM

## 2021-08-31 ENCOUNTER — Ambulatory Visit: Payer: Medicare Other | Attending: Nurse Practitioner

## 2021-08-31 DIAGNOSIS — Z0000 Encounter for general adult medical examination without abnormal findings: Secondary | ICD-10-CM

## 2021-08-31 NOTE — Progress Notes (Signed)
Subjective:   Phillip Paul is a 71 y.o. male who presents for an Initial Medicare Annual Wellness Visit.  I connected with Phillip Paul on 08/31/21 at 10:19am by telephone and verified that I am speaking with the correct person using two identifiers. I discussed the limitations, risks, security and privacy concerns of performing an evaluation and management service by telephone and the availability of in person appointments. I also discussed with the patient that there may be a patient responsible charge related to this service. The patient expressed understanding and agreed to proceed. Patient location:  home My Location:  home Persons on the telephone call:  pt and myself   Review of Systems           Objective:    There were no vitals filed for this visit. There is no height or weight on file to calculate BMI.     08/31/2021   10:20 AM 02/12/2021   11:44 AM 02/01/2019    9:40 AM 08/03/2018    9:23 AM 09/19/2017   11:10 AM 04/25/2017   10:12 AM 03/24/2017   11:43 AM  Advanced Directives  Does Patient Have a Medical Advance Directive? No No No Yes No No No  Type of Scientist, research (medical);Living will     Does patient want to make changes to medical advance directive?    No - Patient declined     Copy of Man in Chart?    No - copy requested     Would patient like information on creating a medical advance directive? No - Patient declined    Yes (MAU/Ambulatory/Procedural Areas - Information given) No - Patient declined     Current Medications (verified) Outpatient Encounter Medications as of 08/31/2021  Medication Sig   amLODipine (NORVASC) 10 MG tablet Take 1 tablet by mouth once daily   aspirin EC 81 MG tablet Take 1 tablet (81 mg total) by mouth daily. Swallow whole.   Blood Glucose Monitoring Suppl (ONE TOUCH ULTRA 2) w/Device KIT Check blood sugar by fingerstick 2x a day. ICD10 E11.8 and E11.65   cholecalciferol  (VITAMIN D) 1000 units tablet Take 1,000 Units by mouth daily.   COENZYME Q10 PO Take by mouth.   FARXIGA 10 MG TABS tablet TAKE 1 TABLET BY MOUTH ONCE DAILY BEFORE BREAKFAST . APPOINTMENT REQUIRED FOR FUTURE REFILLS   fish oil-omega-3 fatty acids 1000 MG capsule Take 1 g by mouth daily.   fluticasone (FLONASE) 50 MCG/ACT nasal spray Place 2 sprays into both nostrils daily.   gabapentin (NEURONTIN) 600 MG tablet Take 0.5 tablets (300 mg total) by mouth 3 (three) times daily.   glucose blood (ONETOUCH ULTRA) test strip USE AS DIRECTED TWICE DAILY. Dx E11.8   insulin detemir (LEVEMIR FLEXPEN) 100 UNIT/ML FlexPen Inject 10 Units into the skin 2 (two) times daily.   Insulin Pen Needle 31G X 5 MM MISC Use as instructed.   losartan (COZAAR) 100 MG tablet Take 1 tablet (100 mg total) by mouth daily.   metFORMIN (GLUCOPHAGE) 500 MG tablet Take 1 tablet (500 mg total) by mouth 2 (two) times daily with a meal.   metoprolol tartrate (LOPRESSOR) 100 MG tablet Take 1 tablet (100 mg total) by mouth as directed. Take one tablet (2) hours before your CT scan   NON FORMULARY [facility]  Anti-fungal (nail)-#1   omeprazole (PRILOSEC) 40 MG capsule Take 1 capsule by mouth once daily   rosuvastatin (CRESTOR)  20 MG tablet Take 1 tablet (20 mg total) by mouth daily.   TRUEPLUS LANCETS 28G MISC Use as instructed   No facility-administered encounter medications on file as of 08/31/2021.    Allergies (verified) Tylenol [acetaminophen]   History: Past Medical History:  Diagnosis Date   Abnormal EKG    Anxiety    Chest pain    Diabetes mellitus without complication (HCC)    Dizzy    Hypertension    Kidney stones    Racing heart beat    Past Surgical History:  Procedure Laterality Date   CYSTOSCOPY     LEFT URETEROSCOPIC STONE MANIPULATION AND REMOVAL; PLACEMENT OF  LEFT DOUBLE-J URETERAL STENT   CYSTOSCOPY W/ URETERAL STENT PLACEMENT     LEFT DOUBLE-J URETERAL STENT   ORIF ANKLE FRACTURE Left  08/17/2012   Procedure: OPEN REDUCTION INTERNAL FIXATION (ORIF) ANKLE FRACTURE/Left;  Surgeon: Wylene Simmer, MD;  Location: [place];  Service: Orthopedics;  Laterality: Left;   TIBIA IM NAIL INSERTION Left 08/17/2012   Procedure: INTRAMEDULLARY (IM) NAIL TIBIAL/Left;  Surgeon: Wylene Simmer, MD;  Location: [place];  Service: Orthopedics;  Laterality: Left;   Family History  Problem Relation Age of Onset   Hypertension Father        lived to 20   Other Brother        MVA   Kidney disease Sister    Other Brother        Stomach problems   Social History   Socioeconomic History   Marital status: Single    Spouse name: Not on file   Number of children: 1   Years of education: Not on file   Highest education level: Bachelor's degree (e.g., BA, AB, BS)  Occupational History   Occupation: retired  Tobacco Use   Smoking status: Former    Packs/day: 0.25    Types: Cigarettes   Smokeless tobacco: Never   Tobacco comments:    States about 3 cigaretters per day, and is "processing quitting"  Vaping Use   Vaping Use: Never used  Substance and Sexual Activity   Alcohol use: Not Currently    Comment: Pt. stated he have not drank in 4 years.    Drug use: No   Sexual activity: Yes  Other Topics Concern   Not on file  Social History Narrative   NO FAMILY HX TO REPORT FROM NEW RECORDS      Patient is right-handed. He lives with his girlfriend. He drinks 2-3 cups or tea a day. He does not exercise.   Family originally from [place] Strain: Not on file  Food Insecurity: Not on file  Transportation Needs: Not on file  Physical Activity: Not on file  Stress: Not on file  Social Connections: Not on file    Tobacco Counseling Counseling given: Not Answered Tobacco comments: States about 3 cigaretters per day, and is "processing quitting"   Clinical Intake:                 Diabetic?Yes         Activities of Daily  Living    08/31/2021   10:21 AM 12/22/2020    9:15 AM  In your present state of health, do you have any difficulty performing the following activities:  Hearing? 0 1  Vision? 1 0  Comment pt has burning in his eyes and has an appt with eye doctor   Difficulty concentrating or making decisions?  0 0  Walking or climbing stairs? 0 0  Dressing or bathing? 0 0  Doing errands, shopping? 0 0  Preparing Food and eating ? N   Using the Toilet? N   In the past six months, have you accidently leaked urine? N   Do you have problems with loss of bowel control? N   Managing your Medications? N   Managing your Finances? N   Housekeeping or managing your Housekeeping? N     Patient Care Team: Gildardo Pounds, NP as PCP - General (Nurse Practitioner) Jerline Pain, MD as PCP - Cardiology (Cardiology) Pieter Partridge, DO as Consulting Physician (Neurology)  Indicate any recent Medical Services you may have received from other than [facility] providers in the past year (date may be approximate).     Assessment:   This is a routine wellness examination for [employer].  Hearing/Vision screen No results found.  Dietary issues and exercise activities discussed:     Goals Addressed   None   Depression Screen    08/31/2021   10:20 AM 08/22/2021   10:07 AM 02/19/2021   10:13 AM 12/22/2020    9:26 AM 09/19/2020    3:34 PM 07/25/2020   10:59 AM 12/27/2019    9:56 AM  PHQ 2/9 Scores  PHQ - 2 Score 0 0 0 0 0 0 0  PHQ- 9 Score 0 0  0 2 1     Fall Risk    08/31/2021   10:20 AM 08/22/2021   10:07 AM 02/19/2021   10:13 AM 12/22/2020    9:25 AM 09/19/2020    3:34 PM  [place] in the past year? 0 0 0 0 0  Number falls in past yr: 0 0  0 0  Injury with Fall? 0 0  0 0  Risk for fall due to : No Fall Risks No Fall Risks No Fall Risks No Fall Risks   Follow up  Falls evaluation completed Falls evaluation completed      FALL RISK PREVENTION PERTAINING TO THE HOME:  Any stairs in or around the  home? Yes  If so, are there any without handrails? No  Home free of loose throw rugs in walkways, pet beds, electrical cords, etc? Yes  Adequate lighting in your home to reduce risk of falls? Yes   ASSISTIVE DEVICES UTILIZED TO PREVENT FALLS:  Life alert? No  Use of a cane, walker or w/c? No  Grab bars in the bathroom? Yes  Shower chair or bench in shower? Yes  Elevated toilet seat or a handicapped toilet? No   TIMED UP AND GO:  Was the test performed? No .  Length of time to ambulate 10 feet: 20 sec. Pt states it should take him 20 secs or maybe less. Pt states he walk like 10 miles every morning   Gait steady and fast without use of assistive device  Cognitive Function:    08/31/2021   10:21 AM  MMSE - Mini Mental State Exam  Orientation to time 5  Orientation to Place 5  Registration 3  Attention/ Calculation 0  Recall 3  Language- name 2 objects 2  Language- repeat 1  Language- follow 3 step command 3  Language- read & follow direction 1  Write a sentence 1  Copy design 0  Total score 24        Immunizations Immunization History  Administered Date(s) Administered   Influenza,inj,Quad PF,6+ Mos 09/08/2017, 09/19/2020  PFIZER(Purple Top)SARS-COV-2 Vaccination 03/14/2019, 04/13/2019, 10/29/2019   PNEUMOCOCCAL CONJUGATE-20 12/22/2020   Pneumococcal Conjugate-13 08/06/2017    TDAP status: Due, Education has been provided regarding the importance of this vaccine. Advised may receive this vaccine at local pharmacy or Health Dept. Aware to provide a copy of the vaccination record if obtained from local pharmacy or Health Dept. Verbalized acceptance and understanding.  Flu Vaccine status: Due, Education has been provided regarding the importance of this vaccine. Advised may receive this vaccine at local pharmacy or Health Dept. Aware to provide a copy of the vaccination record if obtained from local pharmacy or Health Dept. Verbalized acceptance and  understanding.  Pneumococcal vaccine status: Up to date  Covid-19 vaccine status: Completed vaccines Pt has received 3 vaccines   Qualifies for Shingles Vaccine? Yes   Zostavax completed No   Shingrix Completed?: No.    Education has been provided regarding the importance of this vaccine. Patient has been advised to call insurance company to determine out of pocket expense if they have not yet received this vaccine. Advised may also receive vaccine at local pharmacy or Health Dept. Verbalized acceptance and understanding.  Screening Tests Health Maintenance  Topic Date Due   TETANUS/TDAP  Never done   Zoster Vaccines- Shingrix (1 of 2) Never done   COVID-19 Vaccine (4 - Pfizer risk series) 12/24/2019   FOOT EXAM  05/10/2020   OPHTHALMOLOGY EXAM  02/27/2021   INFLUENZA VACCINE  08/14/2021   HEMOGLOBIN A1C  02/22/2022   COLONOSCOPY (Pts 45-38yr Insurance coverage will need to be confirmed)  11/20/2027   Pneumonia Vaccine 71 Years old  Completed   Hepatitis C Screening  Completed   HPV VACCINES  Aged Out    Health Maintenance  Health Maintenance Due  Topic Date Due   TETANUS/TDAP  Never done   Zoster Vaccines- Shingrix (1 of 2) Never done   COVID-19 Vaccine (4 - Pfizer risk series) 12/24/2019   FOOT EXAM  05/10/2020   OPHTHALMOLOGY EXAM  02/27/2021   INFLUENZA VACCINE  08/14/2021    Colorectal cancer screening: Type of screening: Colonoscopy. Completed 11/19/17. Repeat every 10 years  Lung Cancer Screening: (Low Dose CT Chest recommended if Age 71-80years, 30 pack-year currently smoking OR have quit w/in 15years.) does qualify.   Lung Cancer Screening Referral: Pt will discuss further with provider at next office visit. Pt states he stopped smoking a couple of months ago  Additional Screening:  Hepatitis C Screening: does qualify; Completed 02/02/21  Vision Screening: Recommended annual ophthalmology exams for early detection of glaucoma and other disorders of the  eye. Is the patient up to date with their annual eye exam?  Yes  Who is the provider or what is the name of the office in which the patient attends annual eye exams? [facility] If pt is not established with a provider, would they like to be referred to a provider to establish care? No .   Dental Screening: Recommended annual dental exams for proper oral hygiene  Community Resource Referral / Chronic Care Management: CRR required this visit?  No   CCM required this visit?  No      Plan:     I have personally reviewed and noted the following in the patient's chart:   Medical and social history Use of alcohol, tobacco or illicit drugs  Current medications and supplements including opioid prescriptions. Patient is not currently taking opioid prescriptions. Functional ability and status Nutritional status Physical activity Advanced directives List  of other physicians Hospitalizations, surgeries, and ER visits in previous 12 months Vitals Screenings to include cognitive, depression, and falls Referrals and appointments  In addition, I have reviewed and discussed with patient certain preventive protocols, quality metrics, and best practice recommendations. A written personalized care plan for preventive services as well as general preventive health recommendations were provided to patient.     Jackelyn Knife, RMA   08/31/2021   Nurse Notes: I  Spent 30 minutes on this telephone encounter

## 2021-09-04 ENCOUNTER — Other Ambulatory Visit: Payer: Self-pay | Admitting: Family Medicine

## 2021-09-05 NOTE — Telephone Encounter (Signed)
Pt has future visit scheduled, will refill medication.  Requested Prescriptions  Pending Prescriptions Disp Refills  . FARXIGA 10 MG TABS tablet [Pharmacy Med Name: Farxiga 10 MG Oral Tablet] 90 tablet 0    Sig: TAKE 1 TABLET BY MOUTH ONCE DAILY BEFORE BREAKFAST . APPOINTMENT REQUIRED FOR FUTURE REFILLS     Endocrinology:  Diabetes - SGLT2 Inhibitors Passed - 09/04/2021  9:56 AM      Passed - Cr in normal range and within 360 days    Creat  Date Value Ref Range Status  08/09/2019 0.77 0.70 - 1.25 mg/dL Final    Comment:    For patients >69 years of age, the reference limit for Creatinine is approximately 13% higher for people identified as African-American. .    Creatinine, Ser  Date Value Ref Range Status  08/22/2021 0.77 0.76 - 1.27 mg/dL Final         Passed - HBA1C is between 0 and 7.9 and within 180 days    HbA1c, POC (controlled diabetic range)  Date Value Ref Range Status  02/13/2018 7.2 (A) 0.0 - 7.0 % Final   Hgb A1c MFr Bld  Date Value Ref Range Status  08/22/2021 7.8 (H) 4.8 - 5.6 % Final    Comment:             Prediabetes: 5.7 - 6.4          Diabetes: >6.4          Glycemic control for adults with diabetes: <7.0          Passed - eGFR in normal range and within 360 days    GFR, Est African American  Date Value Ref Range Status  06/22/2013 >89 mL/min Final   GFR calc Af Amer  Date Value Ref Range Status  02/23/2020 109 >59 mL/min/1.73 Final    Comment:    **In accordance with recommendations from the NKF-ASN Task force,**   [facility] is in the process of updating its eGFR calculation to the   2021 CKD-EPI creatinine equation that estimates kidney function   without a race variable.    GFR, Est Non African American  Date Value Ref Range Status  06/22/2013 >89 mL/min Final    Comment:      The estimated GFR is a calculation valid for adults (>=75 years old) that uses the CKD-EPI algorithm to adjust for age and sex. It is   not to be used for  children, pregnant women, hospitalized patients,    patients on dialysis, or with rapidly changing kidney function. According to the NKDEP, eGFR >89 is normal, 60-89 shows mild impairment, 30-59 shows moderate impairment, 15-29 shows severe impairment and <15 is ESRD.     GFR, Estimated  Date Value Ref Range Status  02/12/2021 >60 >60 mL/min Final    Comment:    (NOTE) Calculated using the CKD-EPI Creatinine Equation (2021)    GFR  Date Value Ref Range Status  03/28/2017 115.83 >60.00 mL/min Final   eGFR  Date Value Ref Range Status  08/22/2021 96 >59 mL/min/1.73 Final         Passed - Valid encounter within last 6 months    Recent Outpatient Visits          2 weeks ago Encounter for annual physical exam   [place], Vernia Buff, NP   4 months ago Chest pain, unspecified type   [place], Vernia Buff, NP  8 months ago Controlled type 2 diabetes mellitus with complication, with long-term current use of insulin [facility])   [place], [place] W, NP   11 months ago Controlled type 2 diabetes mellitus with complication, with long-term current use of insulin [facility])   [place], Vernia Buff, NP   1 year ago Buttocks nodule   [place], Vernia Buff, NP      Future Appointments            In 2 months Gildardo Pounds, NP [place]

## 2021-09-13 ENCOUNTER — Ambulatory Visit (INDEPENDENT_AMBULATORY_CARE_PROVIDER_SITE_OTHER): Payer: Medicare Other | Admitting: Podiatry

## 2021-09-13 DIAGNOSIS — Z794 Long term (current) use of insulin: Secondary | ICD-10-CM | POA: Diagnosis not present

## 2021-09-13 DIAGNOSIS — B351 Tinea unguium: Secondary | ICD-10-CM | POA: Diagnosis not present

## 2021-09-13 DIAGNOSIS — D2121 Benign neoplasm of connective and other soft tissue of right lower limb, including hip: Secondary | ICD-10-CM | POA: Diagnosis not present

## 2021-09-13 DIAGNOSIS — E118 Type 2 diabetes mellitus with unspecified complications: Secondary | ICD-10-CM

## 2021-09-13 MED ORDER — CICLOPIROX OLAMINE 0.77 % EX CREA
TOPICAL_CREAM | Freq: Two times a day (BID) | CUTANEOUS | 0 refills | Status: DC
Start: 2021-09-13 — End: 2022-05-27

## 2021-09-16 ENCOUNTER — Encounter: Payer: Self-pay | Admitting: Podiatry

## 2021-09-16 NOTE — Progress Notes (Signed)
  Subjective:  Patient ID: Phillip Paul, male    DOB: 03/07/1950,  MRN: [id number]  Chief Complaint  Patient presents with   Nail Problem    Thick painful toenails    70 y.o. male presents with the above complaint. History confirmed with patient. Patient presents for nail pain and elongation unable to trim himself. Nails are thickened and discolored for a long time. Also here for raised bump on the bottom of his right foot. This bump has been present for weeks to months. Causes him pain when putting pressure down on right foot.   Objective:  Physical Exam: warm, good capillary refill, nail exam onychomycosis of the toenails and painful rasied nodule approx 1.4x1cm present at the plantar medial aspect of the plantar fascial band central arch right foot, DP pulses palpable, PT pulses palpable, and protective sensation intact Left Foot: normal exam, no swelling, tenderness, instability; ligaments intact, full range of motion of all ankle/foot joints  Right Foot: point tenderness of the mid plantar fascia   No images are attached to the encounter.  Assessment:   1. Fibroma of right foot   2. Tinea unguium      Plan:  Patient was evaluated and treated and all questions answered.  Plantar fibroma right foot, Onychomycosis, and Onychodystrophy  Nails debrided 1-5 both feet. Rx sent for ciclopirox 0.77% cream applied daily to nails for onychomycosis. Patient aware this may not fully resolve his nail fungal infection.   Discussed plantar fibroma as likely diagnosis of the right foot plantar mass. Discussed that we can try and injection of steroid around the lesion to attempt to reduce pain and inflammation. This may help to shrink the lesion. If it does not improve significant discussed that surgical correction including excision of the mass may be necessary to resolve the issue. Discussed risks benefits and alternatives to surgical correction. Patient is higher risk for surgery given  his comorbidities including DM2. Will have him back in 4 weeks to assess progress after injection.  Procedure: Fibroma injection Location: Right medial plantar fascia  Skin Prep: Alcohol. Injectate: 0.5 cc marcaine plain, 0.5 cc kenalog 10. Disposition: Patient tolerated procedure well. Injection site dressed with a band-aid.   Return in about 4 weeks (around 10/11/2021).

## 2021-10-02 ENCOUNTER — Other Ambulatory Visit: Payer: Self-pay | Admitting: Family Medicine

## 2021-10-02 DIAGNOSIS — I10 Essential (primary) hypertension: Secondary | ICD-10-CM

## 2021-10-02 NOTE — Telephone Encounter (Signed)
Requested Prescriptions  Pending Prescriptions Disp Refills  . losartan (COZAAR) 100 MG tablet [Pharmacy Med Name: Losartan Potassium 100 MG Oral Tablet] 90 tablet 1    Sig: TAKE 1 TABLET BY MOUTH ONCE DAILY . APPOINTMENT REQUIRED FOR FUTURE REFILLS     Cardiovascular:  Angiotensin Receptor Blockers Failed - 10/02/2021  9:20 AM      Failed - Last BP in normal range    BP Readings from Last 1 Encounters:  08/22/21 (!) 147/83         Passed - Cr in normal range and within 180 days    Creat  Date Value Ref Range Status  08/09/2019 0.77 0.70 - 1.25 mg/dL Final    Comment:    For patients >8 years of age, the reference limit for Creatinine is approximately 13% higher for people identified as African-American. .    Creatinine, Ser  Date Value Ref Range Status  08/22/2021 0.77 0.76 - 1.27 mg/dL Final         Passed - K in normal range and within 180 days    Potassium  Date Value Ref Range Status  08/22/2021 4.3 3.5 - 5.2 mmol/L Final         Passed - Patient is not pregnant      Passed - Valid encounter within last 6 months    Recent Outpatient Visits          1 month ago Encounter for annual physical exam   [place], [place] W, NP   5 months ago Chest pain, unspecified type   [place], [place] W, NP   9 months ago Controlled type 2 diabetes mellitus with complication, with long-term current use of insulin [facility])   [place], [place] W, NP   1 year ago Controlled type 2 diabetes mellitus with complication, with long-term current use of insulin [facility])   [place], Vernia Buff, NP   1 year ago Buttocks nodule   [place], Vernia Buff, NP      Future Appointments            In 1 month Gildardo Pounds, NP [place]

## 2021-10-04 DIAGNOSIS — H04123 Dry eye syndrome of bilateral lacrimal glands: Secondary | ICD-10-CM | POA: Diagnosis not present

## 2021-10-04 DIAGNOSIS — H2513 Age-related nuclear cataract, bilateral: Secondary | ICD-10-CM | POA: Diagnosis not present

## 2021-10-04 DIAGNOSIS — H524 Presbyopia: Secondary | ICD-10-CM | POA: Diagnosis not present

## 2021-10-04 DIAGNOSIS — H5203 Hypermetropia, bilateral: Secondary | ICD-10-CM | POA: Diagnosis not present

## 2021-10-04 DIAGNOSIS — E119 Type 2 diabetes mellitus without complications: Secondary | ICD-10-CM | POA: Diagnosis not present

## 2021-10-04 DIAGNOSIS — Z794 Long term (current) use of insulin: Secondary | ICD-10-CM | POA: Diagnosis not present

## 2021-10-05 ENCOUNTER — Other Ambulatory Visit: Payer: Self-pay | Admitting: Nurse Practitioner

## 2021-10-05 DIAGNOSIS — Z794 Long term (current) use of insulin: Secondary | ICD-10-CM

## 2021-10-05 NOTE — Telephone Encounter (Signed)
Requested Prescriptions  Pending Prescriptions Disp Refills  . LEVEMIR FLEXPEN 100 UNIT/ML FlexPen [Pharmacy Med Name: Levemir FlexPen 100 UNIT/ML Subcutaneous Solution Pen-injector] 15 mL 0    Sig: INJECT [place] DAILY     Endocrinology:  Diabetes - Insulins Passed - 10/05/2021 11:41 AM      Passed - HBA1C is between 0 and 7.9 and within 180 days    HbA1c, POC (controlled diabetic range)  Date Value Ref Range Status  02/13/2018 7.2 (A) 0.0 - 7.0 % Final   Hgb A1c MFr Bld  Date Value Ref Range Status  08/22/2021 7.8 (H) 4.8 - 5.6 % Final    Comment:             Prediabetes: 5.7 - 6.4          Diabetes: >6.4          Glycemic control for adults with diabetes: <7.0          Passed - Valid encounter within last 6 months    Recent Outpatient Visits          1 month ago Encounter for annual physical exam   [place], [place] W, NP   5 months ago Chest pain, unspecified type   [place], [place] W, NP   9 months ago Controlled type 2 diabetes mellitus with complication, with long-term current use of insulin [facility])   [place], [place] W, NP   1 year ago Controlled type 2 diabetes mellitus with complication, with long-term current use of insulin [facility])   [place], Vernia Buff, NP   1 year ago Buttocks nodule   [place], Vernia Buff, NP      Future Appointments            In 1 month Gildardo Pounds, NP [place]

## 2021-11-24 ENCOUNTER — Other Ambulatory Visit: Payer: Self-pay | Admitting: Nurse Practitioner

## 2021-11-24 DIAGNOSIS — I10 Essential (primary) hypertension: Secondary | ICD-10-CM

## 2021-11-26 ENCOUNTER — Encounter: Payer: Self-pay | Admitting: Nurse Practitioner

## 2021-11-26 ENCOUNTER — Ambulatory Visit: Payer: Medicare Other | Attending: Nurse Practitioner | Admitting: Nurse Practitioner

## 2021-11-26 VITALS — BP 136/73 | HR 88 | Temp 98.0°F | Ht 70.0 in | Wt 216.0 lb

## 2021-11-26 DIAGNOSIS — Z7982 Long term (current) use of aspirin: Secondary | ICD-10-CM | POA: Insufficient documentation

## 2021-11-26 DIAGNOSIS — Z794 Long term (current) use of insulin: Secondary | ICD-10-CM | POA: Insufficient documentation

## 2021-11-26 DIAGNOSIS — E118 Type 2 diabetes mellitus with unspecified complications: Secondary | ICD-10-CM | POA: Diagnosis not present

## 2021-11-26 DIAGNOSIS — Z7984 Long term (current) use of oral hypoglycemic drugs: Secondary | ICD-10-CM | POA: Insufficient documentation

## 2021-11-26 DIAGNOSIS — E1142 Type 2 diabetes mellitus with diabetic polyneuropathy: Secondary | ICD-10-CM

## 2021-11-26 DIAGNOSIS — F419 Anxiety disorder, unspecified: Secondary | ICD-10-CM | POA: Diagnosis not present

## 2021-11-26 DIAGNOSIS — Z7985 Long-term (current) use of injectable non-insulin antidiabetic drugs: Secondary | ICD-10-CM | POA: Diagnosis not present

## 2021-11-26 DIAGNOSIS — I10 Essential (primary) hypertension: Secondary | ICD-10-CM

## 2021-11-26 DIAGNOSIS — Z79899 Other long term (current) drug therapy: Secondary | ICD-10-CM | POA: Diagnosis not present

## 2021-11-26 LAB — POCT GLYCOSYLATED HEMOGLOBIN (HGB A1C): HbA1c, POC (controlled diabetic range): 8.4 % — AB (ref 0.0–7.0)

## 2021-11-26 MED ORDER — DAPAGLIFLOZIN PROPANEDIOL 10 MG PO TABS: 10.0000 mg | ORAL_TABLET | Freq: Every day | ORAL | 1 refills | Status: DC

## 2021-11-26 MED ORDER — AMLODIPINE BESYLATE 10 MG PO TABS: 10.0000 mg | ORAL_TABLET | Freq: Every day | ORAL | 1 refills | Status: DC

## 2021-11-26 MED ORDER — TRULICITY 0.75 MG/0.5ML ~~LOC~~ SOAJ: 0.7500 mg | SUBCUTANEOUS | 1 refills | Status: DC

## 2021-11-26 MED ORDER — GABAPENTIN 600 MG PO TABS: 600.0000 mg | ORAL_TABLET | Freq: Three times a day (TID) | ORAL | 1 refills | Status: DC

## 2021-11-26 MED ORDER — BUSPIRONE HCL 5 MG PO TABS: 5.0000 mg | ORAL_TABLET | Freq: Two times a day (BID) | ORAL | 3 refills | Status: DC

## 2021-11-26 NOTE — Progress Notes (Addendum)
Assessment & Plan:  Phillip Paul was seen today for diabetes.  Diagnoses and all orders for this visit:  Controlled type 2 diabetes mellitus with complication, with long-term current use of insulin (HCC) -     POCT glycosylated hemoglobin (Hb A1C) -     Dulaglutide (TRULICITY) 2.94 TM/5.4YT SOPN; Inject 0.75 mg into the skin once a week. -     CMP14+EGFR -     Lipid panel -     Microalbumin / creatinine urine ratio -     dapagliflozin propanediol (FARXIGA) 10 MG TABS tablet; Take 1 tablet (10 mg total) by mouth daily. Continue blood sugar control as discussed in office today, low carbohydrate diet, and regular physical exercise as tolerated, 150 minutes per week (30 min each day, 5 days per week, or 50 min 3 days per week). Keep blood sugar logs with fasting goal of 90-130 mg/dl, post prandial (after you eat) less than 180.  For Hypoglycemia: BS <60 and Hyperglycemia BS >400; contact the clinic ASAP. Annual eye exams and foot exams are recommended.   Anxiety -     busPIRone (BUSPAR) 5 MG tablet; Take 1 tablet (5 mg total) by mouth 2 (two) times daily. FOR ANXIETY  Primary hypertension -     amLODipine (NORVASC) 10 MG tablet; Take 1 tablet (10 mg total) by mouth daily. Continue all antihypertensives as prescribed.  Reminded to bring in blood pressure log for follow  up appointment.  RECOMMENDATIONS: DASH/Mediterranean Diets are healthier choices for HTN.    Diabetic polyneuropathy associated with type 2 diabetes mellitus (HCC) -     gabapentin (NEURONTIN) 600 MG tablet; Take 1 tablet (600 mg total) by mouth 3 (three) times daily.    Patient has been counseled on age-appropriate routine health concerns for screening and prevention. These are reviewed and up-to-date. Referrals have been placed accordingly. Immunizations are up-to-date or declined.    Subjective:   Chief Complaint  Patient presents with   Diabetes    No concerns just questions.   HPI Phillip Paul  71 y.o. male presents to office today for follow up to DM 2 and HTN  He has a past medical history of Abnormal EKG, Anxiety, Chest pain, DM2, diabetic peripheral neuropathy,  Hypertension, Kidney stones, and Racing heart beat.    HTN Blood pressure is elevated today. He endorses increased stress and anxiety from family conflict with his daughter. Stopped smoking several weeks ago but sometimes will smoke a few cigarettes when stressed and after getting upset with his daughter. Endorses intermittent left sided chest pain with left arm tingling today. EKG in office was essentially normal aside from mild hypertrophy. His crestor was recently increased by Cardiology due to cardiac CT results showing increased plaque.  BP Readings from Last 3 Encounters:  11/26/21 136/73  08/22/21 (!) 147/83  06/20/21 110/68       DM 2 Not quite at goal. He is currently taking farxiga 10 mg daily and metformin 500 mg daily. He does endorse diabetic polyneuropathy mostly in his bilateral feet. I have instructed him to increase gabapentin to 600 mg TID at this time. He is also seeing podiatry for right foot fibroma. We will add Trulicity today for better diabetes control. He is endorsing cravings for sweets and unhealthy snacking Lab Results  Component Value Date   HGBA1C 8.4 (A) 11/26/2021    Lab Results  Component Value Date   HGBA1C 7.8 (H) 08/22/2021      Review of Systems  Constitutional:  Negative for fever, malaise/fatigue and weight loss.  HENT: Negative.  Negative for nosebleeds.   Eyes: Negative.  Negative for blurred vision, double vision and photophobia.  Respiratory: Negative.  Negative for cough and shortness of breath.   Cardiovascular: Negative.  Negative for chest pain, palpitations and leg swelling.  Gastrointestinal: Negative.  Negative for heartburn, nausea and vomiting.  Musculoskeletal: Negative.  Negative for myalgias.  Neurological:  Positive for tingling and sensory change.  Negative for dizziness, focal weakness, seizures and headaches.       Peripheral neuropathy  Psychiatric/Behavioral: Negative.  Negative for suicidal ideas.     Past Medical History:  Diagnosis Date   Abnormal EKG    Anxiety    Chest pain    Diabetes mellitus without complication (HCC)    Dizzy    Hypertension    Kidney stones    Racing heart beat     Past Surgical History:  Procedure Laterality Date   CYSTOSCOPY     LEFT URETEROSCOPIC STONE MANIPULATION AND REMOVAL; PLACEMENT OF  LEFT DOUBLE-J URETERAL STENT   CYSTOSCOPY W/ URETERAL STENT PLACEMENT     LEFT DOUBLE-J URETERAL STENT   ORIF ANKLE FRACTURE Left 08/17/2012   Procedure: OPEN REDUCTION INTERNAL FIXATION (ORIF) ANKLE FRACTURE/Left;  Surgeon: Wylene Simmer, MD;  Location: [place] [facility];  Service: Orthopedics;  Laterality: Left;   TIBIA IM NAIL INSERTION Left 08/17/2012   Procedure: INTRAMEDULLARY (IM) NAIL TIBIAL/Left;  Surgeon: Wylene Simmer, MD;  Location: [facility];  Service: Orthopedics;  Laterality: Left;    Family History  Problem Relation Age of Onset   Hypertension Father        lived to 82   Other Brother        MVA   Kidney disease Sister    Other Brother        Stomach problems    Social History Reviewed with no changes to be made today.   Outpatient Medications Prior to Visit  Medication Sig Dispense Refill   aspirin EC 81 MG tablet Take 1 tablet (81 mg total) by mouth daily. Swallow whole. 90 tablet 3   Blood Glucose Monitoring Suppl (ONE TOUCH ULTRA 2) w/Device KIT Check blood sugar by fingerstick 2x a day. ICD10 E11.8 and E11.65 1 each 0   cholecalciferol (VITAMIN D) 1000 units tablet Take 1,000 Units by mouth daily.     ciclopirox (LOPROX) 0.77 % cream Apply topically 2 (two) times daily. 15 g 0   COENZYME Q10 PO Take by mouth.     fish oil-omega-3 fatty acids 1000 MG capsule Take 1 g by mouth daily.     fluticasone (FLONASE) 50 MCG/ACT nasal spray Place 2 sprays into both nostrils daily. 16 g 2   glucose  blood (ONETOUCH ULTRA) test strip USE AS DIRECTED TWICE DAILY. Dx E11.8 200 each 0   Insulin Pen Needle 31G X 5 MM MISC Use as instructed. 100 each 12   LEVEMIR FLEXPEN 100 UNIT/ML FlexPen INJECT 10 UNITS SUBCUTANEOUSLY TWICE DAILY 18 mL 6   losartan (COZAAR) 100 MG tablet TAKE 1 TABLET BY MOUTH ONCE DAILY . APPOINTMENT REQUIRED FOR FUTURE REFILLS 90 tablet 1   metFORMIN (GLUCOPHAGE) 500 MG tablet Take 1 tablet (500 mg total) by mouth 2 (two) times daily with a meal. 180 tablet 1   NON FORMULARY [facility]  Anti-fungal (nail)-#1     omeprazole (PRILOSEC) 40 MG capsule Take 1 capsule by mouth once daily 30 capsule 5   rosuvastatin (CRESTOR) 20  MG tablet Take 1 tablet (20 mg total) by mouth daily. 90 tablet 3   TRUEPLUS LANCETS 28G MISC Use as instructed 100 each 3   amLODipine (NORVASC) 10 MG tablet Take 1 tablet by mouth once daily 90 tablet 0   FARXIGA 10 MG TABS tablet TAKE 1 TABLET BY MOUTH ONCE DAILY BEFORE BREAKFAST . APPOINTMENT REQUIRED FOR FUTURE REFILLS 90 tablet 0   gabapentin (NEURONTIN) 600 MG tablet Take 0.5 tablets (300 mg total) by mouth 3 (three) times daily. 135 tablet 1   metoprolol tartrate (LOPRESSOR) 100 MG tablet Take 1 tablet (100 mg total) by mouth as directed. Take one tablet (2) hours before your CT scan 1 tablet 0   No facility-administered medications prior to visit.    Allergies  Allergen Reactions   Tylenol [Acetaminophen] Other (See Comments)    ELEVATED LFTs       Objective:    BP 136/73   Pulse 88   Temp 98 F (36.7 C) (Temporal)   Ht _0  (1.778 m)   Wt 216 lb (98 kg)   SpO2 94%   BMI 30.99 kg/m  Wt Readings from Last 3 Encounters:  11/26/21 216 lb (98 kg)  08/22/21 212 lb 12.8 oz (96.5 kg)  05/30/21 204 lb (92.5 kg)    Physical Exam Vitals and nursing note reviewed.  Constitutional:      Appearance: He is well-developed.  HENT:     Head: Normocephalic and atraumatic.  Cardiovascular:     Rate and Rhythm: Normal rate and  regular rhythm.     Heart sounds: Normal heart sounds. No murmur heard.    No friction rub. No gallop.  Pulmonary:     Effort: Pulmonary effort is normal. No tachypnea or respiratory distress.     Breath sounds: Normal breath sounds. No decreased breath sounds, wheezing, rhonchi or rales.  Chest:     Chest wall: No tenderness.  Abdominal:     General: Bowel sounds are normal.     Palpations: Abdomen is soft.  Musculoskeletal:        General: Normal range of motion.     Cervical back: Normal range of motion.  Skin:    General: Skin is warm and dry.  Neurological:     Mental Status: He is alert and oriented to person, place, and time.     Coordination: Coordination normal.  Psychiatric:        Behavior: Behavior normal. Behavior is cooperative.        Thought Content: Thought content normal.        Judgment: Judgment normal.          Patient has been counseled extensively about nutrition and exercise as well as the importance of adherence with medications and regular follow-up. The patient was given clear instructions to go to ER or return to medical center if symptoms don't improve, worsen or new problems develop. The patient verbalized understanding.   Follow-up: Return in about 4 weeks (around 12/24/2021) for meter check with luke in 4-6 weeks. See me in 3 months.   Gildardo Pounds, FNP-BC [facility] [place], [place]   11/26/2021, 12:19 PM

## 2021-11-27 LAB — CMP14+EGFR
ALT: 37 IU/L (ref 0–44)
AST: 29 IU/L (ref 0–40)
Albumin/Globulin Ratio: 1.8 (ref 1.2–2.2)
Albumin: 4.9 g/dL — ABNORMAL HIGH (ref 3.8–4.8)
Alkaline Phosphatase: 90 IU/L (ref 44–121)
BUN/Creatinine Ratio: 16 (ref 10–24)
BUN: 12 mg/dL (ref 8–27)
Bilirubin Total: 0.3 mg/dL (ref 0.0–1.2)
CO2: 24 mmol/L (ref 20–29)
Calcium: 9.9 mg/dL (ref 8.6–10.2)
Chloride: 101 mmol/L (ref 96–106)
Creatinine, Ser: 0.77 mg/dL (ref 0.76–1.27)
Globulin, Total: 2.8 g/dL (ref 1.5–4.5)
Glucose: 117 mg/dL — ABNORMAL HIGH (ref 70–99)
Potassium: 4.1 mmol/L (ref 3.5–5.2)
Sodium: 139 mmol/L (ref 134–144)
Total Protein: 7.7 g/dL (ref 6.0–8.5)
eGFR: 96 mL/min/{1.73_m2} (ref >59)

## 2021-11-27 LAB — LIPID PANEL
Chol/HDL Ratio: 3.2 ratio (ref 0.0–5.0)
Cholesterol, Total: 120 mg/dL (ref 100–199)
HDL: 37 mg/dL — ABNORMAL LOW (ref >39)
LDL Chol Calc (NIH): 61 mg/dL (ref 0–99)
Triglycerides: 122 mg/dL (ref 0–149)
VLDL Cholesterol Cal: 22 mg/dL (ref 5–40)

## 2021-11-27 LAB — MICROALBUMIN / CREATININE URINE RATIO
Creatinine, Urine: 48.9 mg/dL
Microalb/Creat Ratio: 109 mg/g creat — ABNORMAL HIGH (ref 0–29)
Microalbumin, Urine: 53.4 ug/mL

## 2021-12-20 ENCOUNTER — Ambulatory Visit (INDEPENDENT_AMBULATORY_CARE_PROVIDER_SITE_OTHER): Payer: Medicare Other | Admitting: Podiatry

## 2021-12-20 DIAGNOSIS — D2121 Benign neoplasm of connective and other soft tissue of right lower limb, including hip: Secondary | ICD-10-CM | POA: Diagnosis not present

## 2021-12-20 DIAGNOSIS — Z794 Long term (current) use of insulin: Secondary | ICD-10-CM

## 2021-12-20 DIAGNOSIS — E118 Type 2 diabetes mellitus with unspecified complications: Secondary | ICD-10-CM | POA: Diagnosis not present

## 2021-12-20 MED ORDER — CICLOPIROX 8 % EX SOLN: Freq: Every day | CUTANEOUS | 0 refills | Status: DC

## 2021-12-20 NOTE — Progress Notes (Signed)
  Subjective:  Patient ID: Phillip Paul, male    DOB: [birth year],  MRN: [id number]  Chief Complaint  Patient presents with   Foot Problem    Patient complaining of foot pain. 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     71 y.o. male presents with the above complaint. History confirmed with patient.  Patient is presenting for follow-up primarily on right foot bump in the bottom of the arch.  He was diagnosed with plantar fibroma at last visit.  He states that he has not been able to do his normal walking routine which includes up to 10 miles of walking per day.  He says it is related to pain from a bump on the bottom of the right arch.  He was given a steroid injection at last time for this fibroma but it did not really help with his pain at all.  He is interested in possibly having it corrected if it is felt that it would be beneficial for him.  He is also being treated for nail fungal infection using the topical antifungal and requesting more that at this.  Objective:  Physical Exam: warm, good capillary refill, nail exam onychomycosis of the toenails stable from prior   painful rasied nodule approx 1.4x1cm present at the plantar medial aspect of the plantar fascial band central arch right foot, pain on palpation of this area.   DP pulses palpable, PT pulses palpable, and protective sensation intact Left Foot: normal exam, no swelling, tenderness, instability; ligaments intact, full range of motion of all ankle/foot joints  Right Foot: point tenderness of the mid plantar fascia   No images are attached to the encounter.  Assessment:   1. Fibroma of right foot   2. Controlled type 2 diabetes mellitus with complication, with long-term current use of insulin ([place])     Plan:  Patient was evaluated and treated and all questions answered.  # Plantar fibroma Right foot -Discussed with patient again the nature of the lesion present in the plantar medial arch of his right foot.  Do believe this is  consistent with a solitary plantar fibroma. -I discussed that as this is preventing him from doing his regular walking routine it would be reasonable to proceed with surgical intervention as it is limiting his normal activities and preventing his ability to continue to be active -Discussed surgical intervention would include resection of the plantar fibroma with wide margin resection of the central medial band of the plantar fascia as needed for removal of the fibroma.  Did explain that there is a high chance of recurrence even with resection however this offers the best option to reduce his pain permanently. -He is diabetic last A1c 7.8 and is therefore at higher risk of wound complication including nonhealing of the wound and infection. -Discussed risk benefits alternatives and expected postoperative course including need for nonweightbearing for approximately 3 to 4 weeks postoperatively with the patient.  He understands and wishes to proceed with surgical intervention and informed consent was obtained at this visit. -Will begin surgical planning.  #Onychomycosis, and Onychodystrophy  Rx sent for penlac 8% solution applied daily to nails for onychomycosis. Patient aware this may not fully resolve his nail fungal infection.    No follow-ups on file.

## 2021-12-21 ENCOUNTER — Other Ambulatory Visit: Payer: Self-pay | Admitting: Nurse Practitioner

## 2021-12-21 DIAGNOSIS — R42 Dizziness and giddiness: Secondary | ICD-10-CM

## 2021-12-25 ENCOUNTER — Telehealth: Payer: Self-pay | Admitting: Podiatry

## 2021-12-25 NOTE — Telephone Encounter (Signed)
DOS: 01/16/2022  Medicare Part [place] Access  Plantar Fibroma Lt [phone])  Prior authorization is not required for either of patients insurances.

## 2022-01-01 ENCOUNTER — Ambulatory Visit: Payer: Medicare Other | Attending: Nurse Practitioner | Admitting: Pharmacist

## 2022-01-01 DIAGNOSIS — Z794 Long term (current) use of insulin: Secondary | ICD-10-CM | POA: Insufficient documentation

## 2022-01-01 DIAGNOSIS — E118 Type 2 diabetes mellitus with unspecified complications: Secondary | ICD-10-CM | POA: Insufficient documentation

## 2022-01-01 DIAGNOSIS — I10 Essential (primary) hypertension: Secondary | ICD-10-CM | POA: Insufficient documentation

## 2022-01-01 DIAGNOSIS — Z7984 Long term (current) use of oral hypoglycemic drugs: Secondary | ICD-10-CM | POA: Diagnosis not present

## 2022-01-01 DIAGNOSIS — Z8249 Family history of ischemic heart disease and other diseases of the circulatory system: Secondary | ICD-10-CM | POA: Insufficient documentation

## 2022-01-01 DIAGNOSIS — F17200 Nicotine dependence, unspecified, uncomplicated: Secondary | ICD-10-CM | POA: Insufficient documentation

## 2022-01-01 DIAGNOSIS — E785 Hyperlipidemia, unspecified: Secondary | ICD-10-CM | POA: Diagnosis not present

## 2022-01-01 MED ORDER — BASAGLAR KWIKPEN 100 UNIT/ML ~~LOC~~ SOPN: 20.0000 [IU] | PEN_INJECTOR | Freq: Every day | SUBCUTANEOUS | 2 refills | Status: DC

## 2022-01-01 NOTE — Progress Notes (Signed)
    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  Patient arrives in good spirits. Presents for diabetes evaluation, education, and management. Patient was referred and last seen by his PCP on 11/26/2021.  Today, patient reports medication adherence with Levemir 10 units BID, Farxiga '10mg'$  daily, and metformin 500 mg BID. He tells me his diabetes was dx ~3-4 years ago. Insulin shortly after dx. No hx of pancreatitis or thyroid cancer. No known hx of clinical ASCVD, CHF, or CKD.   Family/Social History:  -Fhx: HTN in father; kidney disease is sister -Tobacco use: 1 pack/week   Insurance coverage/medication affordability: Medicaid and Medicare A/B  Medication adherence reported.   Current diabetes medications include: Levemir 10 units BID, metformin 500 mg BID, Farxiga 10 mg daily   Patient denies hypoglycemic events.  Patient reported dietary habits: Eats 3 meals/day - Tells me he follows a good diet. Tries to limit sweets. Snacks on fruits (apples and melon fruits). - Has cut down rice. Does not eat bread.    Patient-reported exercise habits:  -walks 8-12 miles daily for 1-2 hours   Patient reports nocturia (nighttime urination). (3-5x/night) Patient reports neuropathy (nerve pain). Patient reports visual changes. Patient reports self foot exams.     O:  Brings home meter for review. Avgs as follows:  7 day avg: 154 14 day avg: 153 30 day avg: 157  Lab Results  Component Value Date   HGBA1C 8.4 (A) 11/26/2021   There were no vitals filed for this visit.  Lipid Panel     Component Value Date/Time   CHOL 120 11/26/2021 1121   TRIG 122 11/26/2021 1121   HDL 37 (L) 11/26/2021 1121   CHOLHDL 3.2 11/26/2021 1121   CHOLHDL 3.5 06/22/2013 1000   VLDL 11 06/22/2013 1000   LDLCALC 61 11/26/2021 1121    Clinical Atherosclerotic Cardiovascular Disease (ASCVD): No  The ASCVD Risk score (Arnett DK, et al.,  2019) failed to calculate for the following reasons:   The valid total cholesterol range is 130 to 320 mg/dL    A/P: Diabetes longstanding currently above goal. Medication adherence appears optimal. Patient is able to verbalize appropriate hypoglycemia management plan. Levemir is being phased out by [employer]. Will have him use up his current supply and then change to Basaglar once he runs out. -Increased Levemir to 12u BID. Plan is to change to Basaglar 20u once daily once his current supply of Levemir is exhausted. He may increase the Basaglar to 25u once daily if needed. -Continued metformin 500 mg BID. -Continued Farxiga 10 mg daily.  -Extensively discussed pathophysiology of diabetes, recommended lifestyle interventions, dietary effects on blood sugar control -Counseled on s/sx of and management of hypoglycemia -Next A1C anticipated 02/2022  Written patient instructions provided. Total time in face to face counseling 25 minutes.   Follow up w/ me in 1 month.     Benard Halsted, PharmD, [place], [place] [phone]

## 2022-01-10 ENCOUNTER — Other Ambulatory Visit: Payer: Self-pay | Admitting: Nurse Practitioner

## 2022-01-10 DIAGNOSIS — Z794 Long term (current) use of insulin: Secondary | ICD-10-CM

## 2022-01-10 DIAGNOSIS — E118 Type 2 diabetes mellitus with unspecified complications: Secondary | ICD-10-CM

## 2022-01-15 ENCOUNTER — Other Ambulatory Visit (INDEPENDENT_AMBULATORY_CARE_PROVIDER_SITE_OTHER): Payer: Medicare Other | Admitting: Podiatry

## 2022-01-15 DIAGNOSIS — Z9889 Other specified postprocedural states: Secondary | ICD-10-CM

## 2022-01-15 DIAGNOSIS — D2121 Benign neoplasm of connective and other soft tissue of right lower limb, including hip: Secondary | ICD-10-CM

## 2022-01-15 MED ORDER — OXYCODONE-ACETAMINOPHEN 5-325 MG PO TABS: 1.0000 | ORAL_TABLET | ORAL | 0 refills | Status: DC | PRN

## 2022-01-15 MED ORDER — ONDANSETRON HCL 4 MG PO TABS: 4.0000 mg | ORAL_TABLET | Freq: Three times a day (TID) | ORAL | 0 refills | Status: DC | PRN

## 2022-01-15 MED ORDER — CEPHALEXIN 500 MG PO CAPS: 500.0000 mg | ORAL_CAPSULE | Freq: Three times a day (TID) | ORAL | 0 refills | Status: AC

## 2022-01-15 NOTE — Progress Notes (Signed)
Postop medications sent 

## 2022-01-16 ENCOUNTER — Encounter: Payer: Self-pay | Admitting: Podiatry

## 2022-01-16 ENCOUNTER — Other Ambulatory Visit (INDEPENDENT_AMBULATORY_CARE_PROVIDER_SITE_OTHER): Payer: Medicare Other | Admitting: Podiatry

## 2022-01-16 ENCOUNTER — Telehealth: Payer: Self-pay | Admitting: Podiatry

## 2022-01-16 DIAGNOSIS — Z9889 Other specified postprocedural states: Secondary | ICD-10-CM

## 2022-01-16 DIAGNOSIS — D2121 Benign neoplasm of connective and other soft tissue of right lower limb, including hip: Secondary | ICD-10-CM | POA: Diagnosis not present

## 2022-01-16 DIAGNOSIS — M722 Plantar fascial fibromatosis: Secondary | ICD-10-CM | POA: Diagnosis not present

## 2022-01-16 DIAGNOSIS — D492 Neoplasm of unspecified behavior of bone, soft tissue, and skin: Secondary | ICD-10-CM | POA: Diagnosis not present

## 2022-01-16 MED ORDER — OXYCODONE HCL 5 MG PO TABS: 5.0000 mg | ORAL_TABLET | ORAL | 0 refills | Status: DC | PRN

## 2022-01-16 NOTE — Progress Notes (Signed)
Sent pt oxycodone without tylenol due to concern for liver issue

## 2022-01-16 NOTE — Telephone Encounter (Signed)
Pts friend called and pt had surgery today and the pain medication that was sent in has tylenol in it and pt is not to be taking anything with tylenol in it.Can we please resend a new prescription for pain medication to the pharmacy.

## 2022-01-17 ENCOUNTER — Telehealth: Payer: Self-pay | Admitting: Podiatry

## 2022-01-17 NOTE — Telephone Encounter (Signed)
Left message for pt that the new pain medication sent to the pharmacy last night and to call if any questions.

## 2022-01-17 NOTE — Telephone Encounter (Signed)
Pt called and he did not get my message and I told him that the other prescription without the tylenol was sent to the pharmacy last night. He said thank you

## 2022-01-18 ENCOUNTER — Telehealth: Payer: Self-pay

## 2022-01-18 ENCOUNTER — Other Ambulatory Visit: Payer: Self-pay | Admitting: Nurse Practitioner

## 2022-01-18 DIAGNOSIS — E1142 Type 2 diabetes mellitus with diabetic polyneuropathy: Secondary | ICD-10-CM

## 2022-01-18 NOTE — Telephone Encounter (Signed)
POST OP CALL-    1) General condition stated by the patient: Patient states he feels good  2) Is the pt having pain? Having some pain but patient states it feels normal  3) Pain score: 4 out of 10  4) Has the pt taken Rx'd pain medication, regularly or PRN? Taking PRN  5) Is the pain medication giving relief? Patient reports pain medication is giving relief.  6) Any fever, chills, nausea, or vomiting, shortness of breath or tightness in calf? Patient denies fever, chills, nausea, vomiting, SOB, and tightness in calf.   7) Is the bandage clean, dry and intact? Patient reports bandage is clean, dry and intact.  8) Is there excessive tightness, bleeding or drainage coming through the bandage?  Patient denies excessive tightness, bleeding, or drainage coming from the bandage.  9) Did you understand all of the post op instruction sheet given? Patient states he understood the post-op instructions  10) Any questions or concerns regarding post op care/recovery? No questions or concerns at this time.     Confirmed POV appointment with patient

## 2022-01-24 ENCOUNTER — Ambulatory Visit (INDEPENDENT_AMBULATORY_CARE_PROVIDER_SITE_OTHER): Payer: Medicare Other | Admitting: Podiatry

## 2022-01-24 ENCOUNTER — Other Ambulatory Visit (HOSPITAL_COMMUNITY): Payer: Self-pay

## 2022-01-24 VITALS — BP 120/68 | HR 80

## 2022-01-24 DIAGNOSIS — E118 Type 2 diabetes mellitus with unspecified complications: Secondary | ICD-10-CM

## 2022-01-24 DIAGNOSIS — D2121 Benign neoplasm of connective and other soft tissue of right lower limb, including hip: Secondary | ICD-10-CM

## 2022-01-24 DIAGNOSIS — Z9889 Other specified postprocedural states: Secondary | ICD-10-CM

## 2022-01-24 DIAGNOSIS — Z794 Long term (current) use of insulin: Secondary | ICD-10-CM

## 2022-01-24 MED ORDER — CICLOPIROX 8 % EX SOLN
Freq: Every day | CUTANEOUS | 0 refills | Status: DC
  Filled 2022-01-24: qty 6.6, 30d supply, fill #0

## 2022-01-24 NOTE — Progress Notes (Signed)
  Subjective:  Patient ID: Phillip Paul, male    DOB: 1950/01/19,  MRN: [id number]  Chief Complaint  Patient presents with   Post-op Follow-up    Patient is here for post op  #1 DOS 01/16/2022 RT FOOT EXCISION OF PLANTAR FIBROMA, WIDE RESECTION PLANTAR FASCIA, patient is requesting toe nail fungal medicine to be [facility].    DOS: 01/16/2022 Procedure: Excision of plantar fibroma right foot  72 y.o. male returns for post-op check.  Patient reports her first postoperative check after surgery on the right foot approximately 1 week ago.  Surgery involved excision of plantar fibroma in the right foot.  He states the pain has been relatively well-controlled.  He has been in a postoperative shoe and has kept the dressing clean dry and intact since surgery as instructed.  Review of Systems: Negative except as noted in the HPI. Denies N/V/F/Ch.   Objective:   Vitals:   01/24/22 1508  BP: 120/68  Pulse: 80   There is no height or weight on file to calculate BMI. Constitutional Well developed. Well nourished.  Vascular Foot warm and well perfused. Capillary refill normal to all digits.  Calf is soft and supple, no posterior calf or knee pain, negative Homans' sign  Neurologic Normal speech. Oriented to person, place, and time. Epicritic sensation to light touch grossly present bilaterally.  Dermatologic Skin healing well without signs of infection. Skin edges well coapted without signs of infection.  Orthopedic: Tenderness to palpation noted about the surgical site.   Multiple view plain film radiographs: Deferred at this visit as the patient had soft tissue procedure only. Assessment:   1. Post-operative state   2. Fibroma of right foot   3. Controlled type 2 diabetes mellitus with complication, with long-term current use of insulin ([place])    Plan:  Patient was evaluated and treated and all questions answered.  S/p foot surgery right foot plantar fibroma  excision -Progressing as expected post-operatively. -XR: Deferred soft tissue procedure -WB Status: Weightbearing as tolerated in postoperative shoe -Sutures: To remain intact until next visit. -Medications: continue tylenol PRN. -Foot redressed with Betadine and gauze dressing.  This can be kept on for another week and then begin dressing changes every third day.  Patient can begin getting the foot wet starting next week but should dry off very thoroughly after.  Return in about 2 weeks (around 02/07/2022) for Second POV right foot fibroma excision.         Everitt Amber, DPM [facility]

## 2022-01-25 ENCOUNTER — Other Ambulatory Visit (HOSPITAL_COMMUNITY): Payer: Self-pay

## 2022-01-25 ENCOUNTER — Other Ambulatory Visit: Payer: Self-pay | Admitting: Nurse Practitioner

## 2022-01-25 DIAGNOSIS — K219 Gastro-esophageal reflux disease without esophagitis: Secondary | ICD-10-CM

## 2022-01-25 NOTE — Telephone Encounter (Signed)
Requested Prescriptions  Pending Prescriptions Disp Refills   omeprazole (PRILOSEC) 40 MG capsule [Pharmacy Med Name: Omeprazole 40 MG Oral Capsule Delayed Release] 30 capsule 2    Sig: Take 1 capsule by mouth once daily     Gastroenterology: Proton Pump Inhibitors Passed - 01/25/2022  6:51 AM      Passed - Valid encounter within last 12 months    Recent Outpatient Visits           3 weeks ago Controlled type 2 diabetes mellitus with complication, with long-term current use of insulin [facility])   [place], Annie Main L, RPH-CPP   2 months ago Controlled type 2 diabetes mellitus with complication, with long-term current use of insulin [facility])   [place], Vernia Buff, NP   5 months ago Encounter for annual physical exam   [place], [place] W, NP   9 months ago Chest pain, unspecified type   [place], [place] W, NP   1 year ago Controlled type 2 diabetes mellitus with complication, with long-term current use of insulin [facility])   [place], Zelda W, NP       Future Appointments             In 1 week Daisy Blossom, Jarome Matin, [place]   In 1 month Gildardo Pounds, NP [place]

## 2022-01-26 ENCOUNTER — Other Ambulatory Visit (HOSPITAL_COMMUNITY): Payer: Self-pay

## 2022-02-05 ENCOUNTER — Ambulatory Visit: Payer: Medicare Other | Attending: Nurse Practitioner | Admitting: Pharmacist

## 2022-02-05 ENCOUNTER — Other Ambulatory Visit: Payer: Self-pay | Admitting: Family Medicine

## 2022-02-05 DIAGNOSIS — Z794 Long term (current) use of insulin: Secondary | ICD-10-CM

## 2022-02-05 DIAGNOSIS — E118 Type 2 diabetes mellitus with unspecified complications: Secondary | ICD-10-CM

## 2022-02-05 DIAGNOSIS — E785 Hyperlipidemia, unspecified: Secondary | ICD-10-CM | POA: Insufficient documentation

## 2022-02-05 DIAGNOSIS — F17210 Nicotine dependence, cigarettes, uncomplicated: Secondary | ICD-10-CM | POA: Diagnosis not present

## 2022-02-05 DIAGNOSIS — Z8249 Family history of ischemic heart disease and other diseases of the circulatory system: Secondary | ICD-10-CM | POA: Diagnosis not present

## 2022-02-05 DIAGNOSIS — I10 Essential (primary) hypertension: Secondary | ICD-10-CM | POA: Insufficient documentation

## 2022-02-05 DIAGNOSIS — E119 Type 2 diabetes mellitus without complications: Secondary | ICD-10-CM | POA: Diagnosis not present

## 2022-02-05 MED ORDER — BASAGLAR KWIKPEN 100 UNIT/ML ~~LOC~~ SOPN: 10.0000 [IU] | PEN_INJECTOR | Freq: Every day | SUBCUTANEOUS | 2 refills | Status: DC

## 2022-02-05 MED ORDER — TRULICITY 1.5 MG/0.5ML ~~LOC~~ SOAJ: 1.5000 mg | SUBCUTANEOUS | 1 refills | Status: DC

## 2022-02-05 NOTE — Progress Notes (Signed)
    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  Patient arrives in good spirits. Presents for diabetes evaluation, education, and management. Patient was referred and last seen by his PCP on 11/26/2021. I saw him on 01/01/2022 and recommend he change to [place] as Levemir is being discontinued by [employer].   Today, patient reports medication adherence with Nancee Liter, Wilder Glade '10mg'$  daily, metformin, and Trulicity. Denies any side effects. He has no complaints today. Of note, he recently had an excision of plantar fibroma to his right foot. He tells me today his foot is healing nicely. Denies any S/sx of infection and blood sugars have remained stable S/p surgery. He is concerned over we  Family/Social History:  -Fhx: HTN in father; kidney disease is sister -Tobacco use: 1 pack/week   Insurance coverage/medication affordability: Medicaid and Medicare A/B  Medication adherence reported.   Current diabetes medications include: Basaglar 20u daily (taking 12u), metformin 500 mg BID, Farxiga 10 mg daily, Trulicity 8.01 mg weekly   Patient denies hypoglycemic events.  Patient reported dietary habits: Eats 3 meals/day - Tells me he follows a good diet. Tries to limit sweets. Snacks on fruits (apples and melon fruits). - Has cut down rice. Does not eat bread.    Patient-reported exercise habits:  -not walking d/t recent foot surgery   Patient denies nocturia (nighttime urination). (3-5x/night) Patient reports neuropathy (nerve pain). Patient denies visual changes. Patient reports self foot exams.     O:  Brings home meter for review. Avgs as follows:  7 day avg: 161 30 day avg: 166  Lab Results  Component Value Date   HGBA1C 8.4 (A) 11/26/2021   There were no vitals filed for this visit.  Lipid Panel     Component Value Date/Time   CHOL 120 11/26/2021 1121   TRIG 122 11/26/2021 1121   HDL 37 (L) 11/26/2021 1121    CHOLHDL 3.2 11/26/2021 1121   CHOLHDL 3.5 06/22/2013 1000   VLDL 11 06/22/2013 1000   LDLCALC 61 11/26/2021 1121    Clinical Atherosclerotic Cardiovascular Disease (ASCVD): No  The ASCVD Risk score (Arnett DK, et al., 2019) failed to calculate for the following reasons:   The valid total cholesterol range is 130 to 320 mg/dL    A/P: Diabetes longstanding currently above goal. Medication adherence appears optimal. Patient is able to verbalize appropriate hypoglycemia management plan. Levemir is being phased out by [employer]. Will have him use up his current supply and then change to Basaglar once he runs out. -Decrease Basaglar to 10u daily -Increase Trulicity 1.5 mg weekly  -Continued metformin 500 mg BID. -Continued Farxiga 10 mg daily.  -Extensively discussed pathophysiology of diabetes, recommended lifestyle interventions, dietary effects on blood sugar control -Counseled on s/sx of and management of hypoglycemia -Next A1C anticipated 02/2022  Written patient instructions provided. Total time in face to face counseling 25 minutes.   Follow up w/ me prn. Zelda next month.   Benard Halsted, PharmD, [place], [place] [phone]

## 2022-02-05 NOTE — Telephone Encounter (Signed)
Dc'd 02/05/22 by Abbie Sons [facility]  Requested Prescriptions  Refused Prescriptions Disp Refills   TRULICITY 1.11 NB/5.6PO SOPN [Pharmacy Med Name: Trulicity 1.41 CV/0.1TH Subcutaneous Solution Pen-injector] 4 mL 0    Sig: INJECT 0.'75MG'$  SUBCUTANEOUSLY ONCE A WEEK     Endocrinology:  Diabetes - GLP-1 Receptor Agonists Failed - 02/05/2022  9:48 AM      Failed - HBA1C is between 0 and 7.9 and within 180 days    HbA1c, POC (controlled diabetic range)  Date Value Ref Range Status  11/26/2021 8.4 (A) 0.0 - 7.0 % Final         Passed - Valid encounter within last 6 months    Recent Outpatient Visits           Today Controlled type 2 diabetes mellitus with complication, with long-term current use of insulin [facility])   [place], [place] [employer] L, RPH-CPP   1 month ago Controlled type 2 diabetes mellitus with complication, with long-term current use of insulin [facility])   [place], [place] L, RPH-CPP   2 months ago Controlled type 2 diabetes mellitus with complication, with long-term current use of insulin [facility])   [place], Vernia Buff, NP   5 months ago Encounter for annual physical exam   [facility] Gildardo Pounds, NP   10 months ago Chest pain, unspecified type   [facility] Gildardo Pounds, NP       Future Appointments             In 3 weeks Gildardo Pounds, NP [place]

## 2022-02-07 ENCOUNTER — Ambulatory Visit (INDEPENDENT_AMBULATORY_CARE_PROVIDER_SITE_OTHER): Payer: Medicare Other | Admitting: Podiatry

## 2022-02-07 DIAGNOSIS — E118 Type 2 diabetes mellitus with unspecified complications: Secondary | ICD-10-CM

## 2022-02-07 DIAGNOSIS — D2121 Benign neoplasm of connective and other soft tissue of right lower limb, including hip: Secondary | ICD-10-CM

## 2022-02-07 DIAGNOSIS — Z9889 Other specified postprocedural states: Secondary | ICD-10-CM

## 2022-02-07 DIAGNOSIS — Z794 Long term (current) use of insulin: Secondary | ICD-10-CM

## 2022-02-07 NOTE — Progress Notes (Signed)
  Subjective:  Patient ID: Phillip Paul, male    DOB: [birth year],  MRN: [id number]  Chief Complaint  Patient presents with   Routine Post Op    Patient states that he is having pain due to his stitches. Patient has minor drainage.     DOS: 01/16/2022 Procedure: Excision of plantar fibroma right foot  72 y.o. male returns for post-op check.  Patient reports her 2nd postoperative check after surgery on the right foot approximately 3 nail trim.  Week ago.  Surgery involved excision of plantar fibroma in the right foot.  He states he is doing well he is not having much pain at this time.  He has been weightbearing in a regular shoe since approximately 1 week ago.  Keeping mostly on his heel.  Says he does think he saw some drainage from the incision recently.  Review of Systems: Negative except as noted in the HPI. Denies N/V/F/Ch.   Objective:   There were no vitals filed for this visit.  There is no height or weight on file to calculate BMI. Constitutional Well developed. Well nourished.  Vascular Foot warm and well perfused. Capillary refill normal to all digits.  Calf is soft and supple, no posterior calf or knee pain, negative Homans' sign  Neurologic Normal speech. Oriented to person, place, and time. Epicritic sensation to light touch grossly present bilaterally.  Dermatologic Skin healing well without signs of infection. Skin edges well coapted without signs of infection.  Sutures are still intact.  Orthopedic: Tenderness to palpation noted about the surgical site.   Multiple view plain film radiographs: Deferred at this visit as the patient had soft tissue procedure only. Assessment:   1. Post-operative state   2. Fibroma of right foot   3. Controlled type 2 diabetes mellitus with complication, with long-term current use of insulin ([place])     Plan:  Patient was evaluated and treated and all questions answered.  S/p foot surgery right foot plantar fibroma  excision -Progressing as expected post-operatively. -XR: Deferred soft tissue procedure -WB Status: Weightbearing as tolerated in regular shoes at this time -Sutures: Removed at this visit incision line reinforced with Steri-Strips -Medications: Pain control as needed no antibiotics indicated -Foot redressed with dry gauze and Coban dressing.  Okay to get the foot wet in the shower but should dry off thoroughly after.  Given extra Steri-Strips to reinforce the incision line.  Do not want him to walk any distances just get around the house as needed. -patient to call if the incision opens up at all otherwise we will see him back in 3 more weeks for third POV  Return in about 3 weeks (around 02/28/2022) for 3rd POV .         Everitt Amber, DPM [facility] [place] [facility]

## 2022-02-26 ENCOUNTER — Encounter: Payer: Self-pay | Admitting: Nurse Practitioner

## 2022-02-26 ENCOUNTER — Ambulatory Visit: Payer: Medicare Other | Attending: Nurse Practitioner | Admitting: Nurse Practitioner

## 2022-02-26 ENCOUNTER — Other Ambulatory Visit: Payer: Self-pay | Admitting: Nurse Practitioner

## 2022-02-26 VITALS — BP 148/82 | HR 78 | Ht 70.0 in | Wt 217.4 lb

## 2022-02-26 DIAGNOSIS — Z79899 Other long term (current) drug therapy: Secondary | ICD-10-CM | POA: Insufficient documentation

## 2022-02-26 DIAGNOSIS — D72829 Elevated white blood cell count, unspecified: Secondary | ICD-10-CM | POA: Diagnosis not present

## 2022-02-26 DIAGNOSIS — F419 Anxiety disorder, unspecified: Secondary | ICD-10-CM

## 2022-02-26 DIAGNOSIS — E1142 Type 2 diabetes mellitus with diabetic polyneuropathy: Secondary | ICD-10-CM

## 2022-02-26 DIAGNOSIS — Z794 Long term (current) use of insulin: Secondary | ICD-10-CM | POA: Diagnosis not present

## 2022-02-26 DIAGNOSIS — E118 Type 2 diabetes mellitus with unspecified complications: Secondary | ICD-10-CM

## 2022-02-26 DIAGNOSIS — Z87442 Personal history of urinary calculi: Secondary | ICD-10-CM | POA: Insufficient documentation

## 2022-02-26 DIAGNOSIS — J984 Other disorders of lung: Secondary | ICD-10-CM

## 2022-02-26 DIAGNOSIS — E1165 Type 2 diabetes mellitus with hyperglycemia: Secondary | ICD-10-CM | POA: Insufficient documentation

## 2022-02-26 DIAGNOSIS — R0602 Shortness of breath: Secondary | ICD-10-CM | POA: Insufficient documentation

## 2022-02-26 DIAGNOSIS — Z23 Encounter for immunization: Secondary | ICD-10-CM | POA: Diagnosis not present

## 2022-02-26 DIAGNOSIS — I10 Essential (primary) hypertension: Secondary | ICD-10-CM | POA: Diagnosis not present

## 2022-02-26 DIAGNOSIS — Z7982 Long term (current) use of aspirin: Secondary | ICD-10-CM | POA: Diagnosis not present

## 2022-02-26 DIAGNOSIS — E11649 Type 2 diabetes mellitus with hypoglycemia without coma: Secondary | ICD-10-CM | POA: Insufficient documentation

## 2022-02-26 DIAGNOSIS — F17210 Nicotine dependence, cigarettes, uncomplicated: Secondary | ICD-10-CM | POA: Insufficient documentation

## 2022-02-26 DIAGNOSIS — B182 Chronic viral hepatitis C: Secondary | ICD-10-CM | POA: Diagnosis not present

## 2022-02-26 DIAGNOSIS — Z7985 Long-term (current) use of injectable non-insulin antidiabetic drugs: Secondary | ICD-10-CM | POA: Insufficient documentation

## 2022-02-26 DIAGNOSIS — I2510 Atherosclerotic heart disease of native coronary artery without angina pectoris: Secondary | ICD-10-CM | POA: Insufficient documentation

## 2022-02-26 DIAGNOSIS — Z131 Encounter for screening for diabetes mellitus: Secondary | ICD-10-CM | POA: Diagnosis not present

## 2022-02-26 DIAGNOSIS — Z7984 Long term (current) use of oral hypoglycemic drugs: Secondary | ICD-10-CM | POA: Diagnosis not present

## 2022-02-26 LAB — POCT GLYCOSYLATED HEMOGLOBIN (HGB A1C): HbA1c, POC (controlled diabetic range): 7 % (ref 0.0–7.0)

## 2022-02-26 LAB — GLUCOSE, POCT (MANUAL RESULT ENTRY): POC Glucose: 107 mg/dl — AB (ref 70–99)

## 2022-02-26 MED ORDER — BUSPIRONE HCL 15 MG PO TABS: 15.0000 mg | ORAL_TABLET | Freq: Two times a day (BID) | ORAL | 3 refills | Status: DC

## 2022-02-26 MED ORDER — ONDANSETRON HCL 4 MG PO TABS: 4.0000 mg | ORAL_TABLET | Freq: Three times a day (TID) | ORAL | 1 refills | Status: DC | PRN

## 2022-02-26 MED ORDER — ALBUTEROL SULFATE HFA 108 (90 BASE) MCG/ACT IN AERS: 2.0000 | INHALATION_SPRAY | Freq: Four times a day (QID) | RESPIRATORY_TRACT | 2 refills | Status: DC | PRN

## 2022-02-26 NOTE — Progress Notes (Signed)
Assessment & Plan:  Phillip Paul was seen today for diabetes and hypertension.  Diagnoses and all orders for this visit:  Controlled type 2 diabetes mellitus with complication, with long-term current use of insulin (HCC) -     POCT glycosylated hemoglobin (Hb A1C) -     POCT glucose (manual entry) -     CMP14+EGFR Continue blood sugar control as discussed in office today, low carbohydrate diet, and regular physical exercise as tolerated, 150 minutes per week (30 min each day, 5 days per week, or 50 min 3 days per week). Keep blood sugar logs with fasting goal of 90-130 mg/dl, post prandial (after you eat) less than 180.  For Hypoglycemia: BS <60 and Hyperglycemia BS >400; contact the clinic ASAP. Annual eye exams and foot exams are recommended.   Primary hypertension Continue all antihypertensives as prescribed.  Reminded to bring in blood pressure log for follow  up appointment.  RECOMMENDATIONS: DASH/Mediterranean Diets are healthier choices for HTN.    Need for influenza vaccination -     Flu Vaccine QUAD High Dose(Fluad)  Leukocytosis, unspecified type -     CBC with Differential  Diabetic polyneuropathy associated with type 2 diabetes mellitus  Follow up with podiatry as scheduled.  Continue gabapentin as prescribed.  Has upcoming appointment for fibroma of right foot   Chronic hepatitis C without hepatic coma ([place]) Eradicated.   Restrictive airway disease -     albuterol (VENTOLIN HFA) 108 (90 Base) MCG/ACT inhaler; Inhale 2 puffs into the lungs every 6 (six) hours as needed for wheezing or shortness of breath.  Anxiety Previous dose ineffective.  Increased BuSpar today to 15 mg -     busPIRone (BUSPAR) 15 MG tablet; Take 1 tablet (15 mg total) by mouth 2 (two) times daily. FOR ANXIETY    Patient has been counseled on age-appropriate routine health concerns for screening and prevention. These are reviewed and up-to-date. Referrals have been placed accordingly.  Immunizations are up-to-date or declined.    Subjective:   Chief Complaint  Patient presents with   Diabetes   Hypertension   HPI Phillip Paul 72 y.o. male presents to office today for follow-up to diabetes and hypertension.    He has a past medical history of Abnormal EKG, Anxiety, Chest pain, DM2, diabetic peripheral neuropathy,  Hypertension, Kidney stones, and Racing heart beat.    He walks daily for exercise, and notes some shortness of breath which she attributes to his weight.  Based on coronary CTA he does have a moderate high burden of calcified plaque and taking Crestor 20 mg daily and omega-3 1 g daily.  He smokes a few cigarettes a day and states he does not finish the entire cigarette when he smokes it.  He continues to work on quitting.     DM 2 Diabetes is improving and he knows average blood glucose readings around 135.  He is medication adherent with taking Farxiga 10 mg daily and metformin 500 mg twice daily along with Trulicity 1.5 mg weekly and Basaglar 10 units daily. LDL is at goal with high intensity statin Lab Results  Component Value Date   HGBA1C 7.0 02/26/2022    Lab Results  Component Value Date   HGBA1C 8.4 (A) 11/26/2021    Lab Results  Component Value Date   LDLCALC 61 11/26/2021     HTN Blood pressure is elevated today however he endorses a high sodium breakfast prior to his office visit today.  He is  taking amlodipine 10 mg daily and losartan 100 mg daily as prescribed.  If continues elevated we may need to add hydrochlorothiazide or chlorthalidone. BP Readings from Last 3 Encounters:  02/26/22 (!) 148/82  01/24/22 120/68  11/26/21 136/73    Anxiety Currently taking BuSpar 5 mg twice daily however he does not feel this is effective with relieving his anxiety.  At this time we will increase to 50 mg twice daily.  Review of Systems  Constitutional:  Negative for fever, malaise/fatigue and weight loss.  HENT: Negative.  Negative  for nosebleeds.   Eyes: Negative.  Negative for blurred vision, double vision and photophobia.  Respiratory:  Positive for shortness of breath. Negative for cough, hemoptysis, sputum production and wheezing.   Cardiovascular: Negative.  Negative for chest pain, palpitations and leg swelling.  Gastrointestinal: Negative.  Negative for heartburn, nausea and vomiting.  Musculoskeletal: Negative.  Negative for myalgias.  Neurological: Negative.  Negative for dizziness, focal weakness, seizures and headaches.  Psychiatric/Behavioral: Negative.  Negative for suicidal ideas.     Past Medical History:  Diagnosis Date   Abnormal EKG    Anxiety    Chest pain    Diabetes mellitus without complication (HCC)    Dizzy    Hypertension    Kidney stones    Racing heart beat     Past Surgical History:  Procedure Laterality Date   CYSTOSCOPY     LEFT URETEROSCOPIC STONE MANIPULATION AND REMOVAL; PLACEMENT OF  LEFT DOUBLE-J URETERAL STENT   CYSTOSCOPY W/ URETERAL STENT PLACEMENT     LEFT DOUBLE-J URETERAL STENT   ORIF ANKLE FRACTURE Left 08/17/2012   Procedure: OPEN REDUCTION INTERNAL FIXATION (ORIF) ANKLE FRACTURE/Left;  Surgeon: Wylene Simmer, MD;  Location: [place];  Service: Orthopedics;  Laterality: Left;   TIBIA IM NAIL INSERTION Left 08/17/2012   Procedure: INTRAMEDULLARY (IM) NAIL TIBIAL/Left;  Surgeon: Wylene Simmer, MD;  Location: [place];  Service: Orthopedics;  Laterality: Left;    Family History  Problem Relation Age of Onset   Hypertension Father        lived to 64   Other Brother        MVA   Kidney disease Sister    Other Brother        Stomach problems    Social History Reviewed with no changes to be made today.   Outpatient Medications Prior to Visit  Medication Sig Dispense Refill   amLODipine (NORVASC) 10 MG tablet Take 1 tablet (10 mg total) by mouth daily. 90 tablet 1   aspirin EC 81 MG tablet Take 1 tablet (81 mg total) by mouth daily. Swallow whole. 90 tablet 3   Blood  Glucose Monitoring Suppl (ONE TOUCH ULTRA 2) w/Device KIT Check blood sugar by fingerstick 2x a day. ICD10 E11.8 and E11.65 1 each 0   cholecalciferol (VITAMIN D) 1000 units tablet Take 1,000 Units by mouth daily.     ciclopirox (LOPROX) 0.77 % cream Apply topically 2 (two) times daily. 15 g 0   ciclopirox (PENLAC) 8 % solution Apply topically at bedtime. Apply over nail and surrounding skin. Apply daily over previous coat. After seven (7) days, may remove with alcohol and continue cycle. 6.6 mL 0   ciclopirox (PENLAC) 8 % solution Apply over nail and surrounding skin at bedtime. Apply daily over previous coat. After seven (7) days, may remove with alcohol and continue cycle. 6.6 mL 0   COENZYME Q10 PO Take by mouth.     dapagliflozin propanediol (FARXIGA)  10 MG TABS tablet Take 1 tablet (10 mg total) by mouth daily. 90 tablet 1   Dulaglutide (TRULICITY) 1.5 0000000 SOPN Inject 1.5 mg into the skin once a week. 6 mL 1   fish oil-omega-3 fatty acids 1000 MG capsule Take 1 g by mouth daily.     fluticasone (FLONASE) 50 MCG/ACT nasal spray Use 2 spray(s) in each nostril once daily 48 g 0   gabapentin (NEURONTIN) 600 MG tablet TAKE 1/2 (ONE-HALF) TABLET BY MOUTH THREE TIMES DAILY 135 tablet 0   glucose blood (ONETOUCH ULTRA) test strip USE AS DIRECTED TWICE DAILY. Dx E11.8 200 each 0   Insulin Glargine (BASAGLAR KWIKPEN) 100 UNIT/ML Inject 10 Units into the skin daily. 15 mL 2   Insulin Pen Needle 31G X 5 MM MISC Use as instructed. 100 each 12   losartan (COZAAR) 100 MG tablet TAKE 1 TABLET BY MOUTH ONCE DAILY . APPOINTMENT REQUIRED FOR FUTURE REFILLS 90 tablet 1   metFORMIN (GLUCOPHAGE) 500 MG tablet Take 1 tablet (500 mg total) by mouth 2 (two) times daily with a meal. 180 tablet 1   NON FORMULARY [place] [facility]  Anti-fungal (nail)-#1     omeprazole (PRILOSEC) 40 MG capsule Take 1 capsule by mouth once daily 30 capsule 2   ondansetron (ZOFRAN) 4 MG tablet Take 1 tablet (4 mg total) by mouth  every 8 (eight) hours as needed for nausea or vomiting. 20 tablet 0   oxyCODONE (ROXICODONE) 5 MG immediate release tablet Take 1 tablet (5 mg total) by mouth every 4 (four) hours as needed for severe pain. 30 tablet 0   rosuvastatin (CRESTOR) 20 MG tablet Take 1 tablet (20 mg total) by mouth daily. 90 tablet 3   tobramycin-dexamethasone (TOBRADEX) ophthalmic solution Instill one drop OD QID x 5 days     TRUEPLUS LANCETS 28G MISC Use as instructed 100 each 3   busPIRone (BUSPAR) 5 MG tablet Take 1 tablet (5 mg total) by mouth 2 (two) times daily. FOR ANXIETY 60 tablet 3   No facility-administered medications prior to visit.    Allergies  Allergen Reactions   Tylenol [Acetaminophen] Other (See Comments)    ELEVATED LFTs       Objective:    BP (!) 148/82   Pulse 78   Ht 5' 10"$  (1.778 m)   Wt 217 lb 6.4 oz (98.6 kg)   SpO2 100%   BMI 31.19 kg/m  Wt Readings from Last 3 Encounters:  02/26/22 217 lb 6.4 oz (98.6 kg)  11/26/21 216 lb (98 kg)  08/22/21 212 lb 12.8 oz (96.5 kg)    Physical Exam Vitals and nursing note reviewed.  Constitutional:      Appearance: He is well-developed.  HENT:     Head: Normocephalic and atraumatic.  Cardiovascular:     Rate and Rhythm: Normal rate and regular rhythm.     Heart sounds: Normal heart sounds. No murmur heard.    No friction rub. No gallop.  Pulmonary:     Effort: Pulmonary effort is normal. No tachypnea or respiratory distress.     Breath sounds: Normal breath sounds. No decreased breath sounds, wheezing, rhonchi or rales.  Chest:     Chest wall: No tenderness.  Abdominal:     General: Bowel sounds are normal.     Palpations: Abdomen is soft.  Musculoskeletal:        General: Normal range of motion.     Cervical back: Normal range of motion.  Skin:  General: Skin is warm and dry.  Neurological:     Mental Status: He is alert and oriented to person, place, and time.     Coordination: Coordination normal.  Psychiatric:         Behavior: Behavior normal. Behavior is cooperative.        Thought Content: Thought content normal.        Judgment: Judgment normal.          Patient has been counseled extensively about nutrition and exercise as well as the importance of adherence with medications and regular follow-up. The patient was given clear instructions to go to ER or return to medical center if symptoms don't improve, worsen or new problems develop. The patient verbalized understanding.   Follow-up: Return in 3 months (on 06/03/2022).   Gildardo Pounds, FNP-BC [facility] and [place], [place]   02/26/2022, 1:50 PM

## 2022-02-26 NOTE — Progress Notes (Signed)
Shortness of breath constantly.

## 2022-02-27 LAB — CMP14+EGFR
ALT: 22 IU/L (ref 0–44)
AST: 20 IU/L (ref 0–40)
Albumin/Globulin Ratio: 1.6 (ref 1.2–2.2)
Albumin: 4.6 g/dL (ref 3.8–4.8)
Alkaline Phosphatase: 84 IU/L (ref 44–121)
BUN/Creatinine Ratio: 20 (ref 10–24)
BUN: 13 mg/dL (ref 8–27)
Bilirubin Total: 0.3 mg/dL (ref 0.0–1.2)
CO2: 23 mmol/L (ref 20–29)
Calcium: 9.5 mg/dL (ref 8.6–10.2)
Chloride: 101 mmol/L (ref 96–106)
Creatinine, Ser: 0.66 mg/dL — ABNORMAL LOW (ref 0.76–1.27)
Globulin, Total: 2.8 g/dL (ref 1.5–4.5)
Glucose: 82 mg/dL (ref 70–99)
Potassium: 4.3 mmol/L (ref 3.5–5.2)
Sodium: 139 mmol/L (ref 134–144)
Total Protein: 7.4 g/dL (ref 6.0–8.5)
eGFR: 100 mL/min/{1.73_m2} (ref >59)

## 2022-02-27 LAB — CBC WITH DIFFERENTIAL/PLATELET
Basophils Absolute: 0.1 10*3/uL (ref 0.0–0.2)
Basos: 1 %
EOS (ABSOLUTE): 0.2 10*3/uL (ref 0.0–0.4)
Eos: 3 %
Hematocrit: 46.3 % (ref 37.5–51.0)
Hemoglobin: 15 g/dL (ref 13.0–17.7)
Immature Grans (Abs): 0 10*3/uL (ref 0.0–0.1)
Immature Granulocytes: 1 %
Lymphocytes Absolute: 1.9 10*3/uL (ref 0.7–3.1)
Lymphs: 28 %
MCH: 28.2 pg (ref 26.6–33.0)
MCHC: 32.4 g/dL (ref 31.5–35.7)
MCV: 87 fL (ref 79–97)
Monocytes Absolute: 0.6 10*3/uL (ref 0.1–0.9)
Monocytes: 10 %
Neutrophils Absolute: 3.8 10*3/uL (ref 1.4–7.0)
Neutrophils: 57 %
Platelets: 223 10*3/uL (ref 150–450)
RBC: 5.31 x10E6/uL (ref 4.14–5.80)
RDW: 13.5 % (ref 11.6–15.4)
WBC: 6.6 10*3/uL (ref 3.4–10.8)

## 2022-02-28 ENCOUNTER — Ambulatory Visit (INDEPENDENT_AMBULATORY_CARE_PROVIDER_SITE_OTHER): Payer: Medicare Other | Admitting: Podiatry

## 2022-02-28 DIAGNOSIS — Z794 Long term (current) use of insulin: Secondary | ICD-10-CM

## 2022-02-28 DIAGNOSIS — Z9889 Other specified postprocedural states: Secondary | ICD-10-CM

## 2022-02-28 DIAGNOSIS — D2121 Benign neoplasm of connective and other soft tissue of right lower limb, including hip: Secondary | ICD-10-CM

## 2022-02-28 DIAGNOSIS — E118 Type 2 diabetes mellitus with unspecified complications: Secondary | ICD-10-CM

## 2022-02-28 NOTE — Progress Notes (Signed)
  Subjective:  Patient ID: Phillip Paul, male    DOB: 24-Jul-1950,  MRN: [id number]  Chief Complaint  Patient presents with   Follow-up    Patient states that he has had some drainage.     DOS: 01/16/2022 Procedure: Excision of plantar fibroma right foot  72 y.o. male returns for post-op check.  Patient reports her third postoperative check after surgery on the right foot approximately 6 Weeks ago.  Surgery involved excision of plantar fibroma in the right foot.  He says that he was having some drainage from the incision on the bottom of his right foot last week however has improved and has not drained at all over the past 4 to 5 days.  Review of Systems: Negative except as noted in the HPI. Denies N/V/F/Ch.   Objective:   There were no vitals filed for this visit.  There is no height or weight on file to calculate BMI. Constitutional Well developed. Well nourished.  Vascular Foot warm and well perfused. Capillary refill normal to all digits.  Calf is soft and supple, no posterior calf or knee pain, negative Homans' sign  Neurologic Normal speech. Oriented to person, place, and time. Epicritic sensation to light touch grossly present bilaterally.  Dermatologic Skin healing well without signs of infection.  Small eschar at 2 portions of the incision however no open wound or drainage.  No erythema no maceration.   Orthopedic: Decreased tenderness to palpation noted about the surgical site.   Multiple view plain film radiographs: Deferred at this visit as the patient had soft tissue procedure only. Assessment:   1. Post-operative state   2. Fibroma of right foot   3. Controlled type 2 diabetes mellitus with complication, with long-term current use of insulin ([place])      Plan:  Patient was evaluated and treated and all questions answered.  S/p foot surgery right foot plantar fibroma excision -Progressing as expected post-operatively.  Patient had some drainage  postoperatively likely a small suture abscess and some delayed healing of the incision due to diabetes however it is improving with local wound care.  Patient has not been on any any antibiotics since the immediate postoperative phase. -XR: Deferred as soft tissue procedure -WB Status: Weightbearing as tolerated in regular shoes at this time -Medications: Pain control as needed no antibiotics indicated -Recommend continued monitoring of the incision site as it continues to heal, monitor for any opening of the incision.  Recommend iodine over the small eschars that were lightly debrided with 10 blade at this visit.  Recommend large adhesive square bandage to monitor for any signs of drainage.  And protection.  Return in about 4 weeks (around 03/28/2022) for Fourth postop visit right foot fibroma excision.         Everitt Amber, DPM [facility] [place] [facility]

## 2022-03-09 ENCOUNTER — Other Ambulatory Visit: Payer: Self-pay | Admitting: Nurse Practitioner

## 2022-03-09 DIAGNOSIS — E118 Type 2 diabetes mellitus with unspecified complications: Secondary | ICD-10-CM

## 2022-03-09 DIAGNOSIS — E1142 Type 2 diabetes mellitus with diabetic polyneuropathy: Secondary | ICD-10-CM

## 2022-03-10 ENCOUNTER — Other Ambulatory Visit: Payer: Self-pay | Admitting: Nurse Practitioner

## 2022-03-10 DIAGNOSIS — F419 Anxiety disorder, unspecified: Secondary | ICD-10-CM

## 2022-03-11 NOTE — Telephone Encounter (Signed)
Rx was sent to pharmacy on 01/18/22 #135/0.   Requested Prescriptions  Pending Prescriptions Disp Refills   glucose blood (ONETOUCH ULTRA) test strip [Pharmacy Med Name: OneTouch Ultra Blue In Vitro Strip] 200 each 0    Sig: USE AS DIRECTED TWICE DAILY     Endocrinology: Diabetes - Testing Supplies Passed - 03/09/2022  3:15 PM      Passed - Valid encounter within last 12 months    Recent Outpatient Visits           1 week ago Controlled type 2 diabetes mellitus with complication, with long-term current use of insulin ([place])   [place] Amber, [place] W, NP   1 month ago Controlled type 2 diabetes mellitus with complication, with long-term current use of insulin [facility])   [place], [place] L, RPH-CPP   2 months ago Controlled type 2 diabetes mellitus with complication, with long-term current use of insulin [facility])   [place], [place] L, RPH-CPP   3 months ago Controlled type 2 diabetes mellitus with complication, with long-term current use of insulin [facility])   [place], Vernia Buff, NP   6 months ago Encounter for annual physical exam   [place], Vernia Buff, NP       Future Appointments             In 2 months Gildardo Pounds, NP [place]            Refused Prescriptions Disp Refills   gabapentin (NEURONTIN) 600 MG tablet [Pharmacy Med Name: Gabapentin 600 MG Oral Tablet] 135 tablet 0    Sig: TAKE 1/2 (ONE-HALF) TABLET BY MOUTH THREE TIMES DAILY     Neurology: Anticonvulsants - gabapentin Failed - 03/09/2022  3:15 PM      Failed - Cr in normal range and within 360 days    Creat  Date Value Ref Range Status  08/09/2019 0.77 0.70 - 1.25 mg/dL Final    Comment:    For patients >89 years of age, the reference limit for  Creatinine is approximately 13% higher for people identified as African-American. .    Creatinine, Ser  Date Value Ref Range Status  02/26/2022 0.66 (L) 0.76 - 1.27 mg/dL Final         Passed - Completed PHQ-2 or PHQ-9 in the last 360 days      Passed - Valid encounter within last 12 months    Recent Outpatient Visits           1 week ago Controlled type 2 diabetes mellitus with complication, with long-term current use of insulin [facility])   [place], [place] W, NP   1 month ago Controlled type 2 diabetes mellitus with complication, with long-term current use of insulin [facility])   [place], [place] L, RPH-CPP   2 months ago Controlled type 2 diabetes mellitus with complication, with long-term current use of insulin [facility])   [place], [place] L, RPH-CPP   3 months ago Controlled type 2 diabetes mellitus with complication, with long-term current use of insulin [facility])   [place], Vernia Buff, NP   6 months ago Encounter for  annual physical exam   [facility] Gildardo Pounds, NP       Future Appointments             In 2 months Gildardo Pounds, NP [place]

## 2022-03-11 NOTE — Telephone Encounter (Signed)
busPIRone (BUSPAR) 15 MG tablet 60 tablet 3 02/26/2022    Sig - Route: Take 1 tablet (15 mg total) by mouth 2 (two) times daily. FOR ANXIETY - Oral   Sent to pharmacy as: busPIRone (BUSPAR) 15 MG tablet   E-Prescribing Status: Receipt confirmed by pharmacy (02/26/2022 12:10 PM EST)    Requested Prescriptions  Refused Prescriptions Disp Refills   busPIRone (BUSPAR) 5 MG tablet [Pharmacy Med Name: busPIRone HCl 5 MG Oral Tablet] 60 tablet 0    Sig: TAKE 1 TABLET BY MOUTH TWICE DAILY FOR ANXIETY     Psychiatry: Anxiolytics/Hypnotics - Non-controlled Passed - 03/10/2022  7:51 AM      Passed - Valid encounter within last 12 months    Recent Outpatient Visits           1 week ago Controlled type 2 diabetes mellitus with complication, with long-term current use of insulin [facility]   [place], [place] W, NP   1 month ago Controlled type 2 diabetes mellitus with complication, with long-term current use of insulin [facility])   [place], [place] L, RPH-CPP   2 months ago Controlled type 2 diabetes mellitus with complication, with long-term current use of insulin [facility])   [place], [place] L, RPH-CPP   3 months ago Controlled type 2 diabetes mellitus with complication, with long-term current use of insulin [facility])   [place], Vernia Buff, NP   6 months ago Encounter for annual physical exam   [place], Vernia Buff, NP       Future Appointments             In 2 months Gildardo Pounds, NP [place]

## 2022-03-12 ENCOUNTER — Encounter: Payer: Self-pay | Admitting: Nurse Practitioner

## 2022-03-12 ENCOUNTER — Other Ambulatory Visit: Payer: Self-pay | Admitting: Nurse Practitioner

## 2022-03-12 DIAGNOSIS — E1142 Type 2 diabetes mellitus with diabetic polyneuropathy: Secondary | ICD-10-CM

## 2022-03-12 MED ORDER — GABAPENTIN 600 MG PO TABS: 600.0000 mg | ORAL_TABLET | Freq: Three times a day (TID) | ORAL | 1 refills | Status: DC

## 2022-03-18 ENCOUNTER — Other Ambulatory Visit: Payer: Self-pay | Admitting: Family Medicine

## 2022-03-18 DIAGNOSIS — R42 Dizziness and giddiness: Secondary | ICD-10-CM

## 2022-03-25 ENCOUNTER — Other Ambulatory Visit: Payer: Self-pay | Admitting: Nurse Practitioner

## 2022-03-25 DIAGNOSIS — I10 Essential (primary) hypertension: Secondary | ICD-10-CM

## 2022-03-26 ENCOUNTER — Ambulatory Visit: Payer: Self-pay | Admitting: *Deleted

## 2022-03-26 ENCOUNTER — Other Ambulatory Visit: Payer: Self-pay | Admitting: Nurse Practitioner

## 2022-03-26 ENCOUNTER — Ambulatory Visit
Admission: RE | Admit: 2022-03-26 | Discharge: 2022-03-26 | Disposition: A | Discharge status: Home / Self Care | Payer: Medicare Other | Source: Ambulatory Visit | Attending: Nurse Practitioner | Admitting: Nurse Practitioner

## 2022-03-26 DIAGNOSIS — B182 Chronic viral hepatitis C: Secondary | ICD-10-CM

## 2022-03-26 DIAGNOSIS — B1920 Unspecified viral hepatitis C without hepatic coma: Secondary | ICD-10-CM | POA: Diagnosis not present

## 2022-03-26 DIAGNOSIS — R7989 Other specified abnormal findings of blood chemistry: Secondary | ICD-10-CM

## 2022-03-26 DIAGNOSIS — R945 Abnormal results of liver function studies: Secondary | ICD-10-CM | POA: Diagnosis not present

## 2022-03-26 NOTE — Telephone Encounter (Signed)
Ruby with [facility] Radiology called to report US abdomen results . 1. Echogenic plaque like material in the dependent portion of the gallbladder measuring 3.5 cm in length. This could represent a large polyp or adherent sludge. Given the significant interval difference since February 02, 2021, recommend a follow-up ultrasound in 3 months. If the finding persists, recommend surgical consultation. 2. Diffuse increased heterogeneous echogenicity in the liver is nonspecific but could be due to reported cirrhosis or hepatic steatosis.

## 2022-03-28 ENCOUNTER — Ambulatory Visit (INDEPENDENT_AMBULATORY_CARE_PROVIDER_SITE_OTHER): Payer: Medicare Other | Admitting: Podiatry

## 2022-03-28 DIAGNOSIS — Z9889 Other specified postprocedural states: Secondary | ICD-10-CM

## 2022-03-28 DIAGNOSIS — D2121 Benign neoplasm of connective and other soft tissue of right lower limb, including hip: Secondary | ICD-10-CM

## 2022-03-28 DIAGNOSIS — Z794 Long term (current) use of insulin: Secondary | ICD-10-CM

## 2022-03-28 DIAGNOSIS — E118 Type 2 diabetes mellitus with unspecified complications: Secondary | ICD-10-CM

## 2022-03-28 NOTE — Progress Notes (Signed)
  Subjective:  Patient ID: Phillip Paul, male    DOB: [birth year],  MRN: [id number]  Chief Complaint  Patient presents with   Follow-up    Patient states that his foot still hurts. Feels like he has a bump in his incision     DOS: 01/16/2022 Procedure: Excision of plantar fibroma right foot  72 y.o. male returns for fourth post-op check.  Patient reports his 4th postoperative check after surgery on the right foot approximately 10 Weeks ago.  Surgery involved excision of plantar fibroma in the right foot.  He is still having some pain in the left foot near the incision site.  He thinks there is a small bump along the incision.  Not sure if it is a recurrence or related to the scar.  Denies any drainage or opening from the incision.  Review of Systems: Negative except as noted in the HPI. Denies N/V/F/Ch.   Objective:   There were no vitals filed for this visit.  There is no height or weight on file to calculate BMI. Constitutional Well developed. Well nourished.  Vascular Foot warm and well perfused. Capillary refill normal to all digits.  Calf is soft and supple, no posterior calf or knee pain, negative Homans' sign  Neurologic Normal speech. Oriented to person, place, and time. Epicritic sensation to light touch grossly present bilaterally.  Dermatologic Incision well healed.  No dehiscence it is now well-healed.  There is mild scar tissue at the proximal aspect of the incision.  This is palpable however I do not feel it represents a recurrence of plantar fibroma underlying  Orthopedic: Decreased tenderness to palpation noted about the surgical site.   Multiple view plain film radiographs: Deferred at this visit as the patient had soft tissue procedure only. Assessment:   1. Post-operative state   2. Fibroma of right foot   3. Controlled type 2 diabetes mellitus with complication, with long-term current use of insulin ([place])       Plan:  Patient was evaluated and  treated and all questions answered.  S/p foot surgery right foot plantar fibroma excision -Progressing as expected post-operatively.   -Patient states that his foot is still progressing he does still have some pain but I encouraged him that hopefully this is just related to scar tissue formation now recurrence of the plantar fibroma however that is a possibility though unlikely this soon postoperatively -Recommend massage to the scar with vitamin E lotion on a daily basis -XR: Deferred as soft tissue procedure -WB Status: Weightbearing as tolerated in regular shoes at this time -Medications: Pain control as needed no antibiotics indicated -Recommend continued monitoring of pain as he gets further out from surgery and continue to get try to get back to his regular activities as desired  Return in about 8 weeks (around 05/23/2022) for 5th POV R foot fibroma excision.         Everitt Amber, DPM [facility]

## 2022-03-30 ENCOUNTER — Emergency Department (HOSPITAL_COMMUNITY): Payer: Medicare Other

## 2022-03-30 ENCOUNTER — Encounter (HOSPITAL_COMMUNITY): Payer: Self-pay

## 2022-03-30 ENCOUNTER — Emergency Department (HOSPITAL_COMMUNITY)
Admission: EM | Admit: 2022-03-30 | Discharge: 2022-03-30 | Disposition: A | Discharge status: Home / Self Care | Payer: Medicare Other | Attending: Emergency Medicine | Admitting: Emergency Medicine

## 2022-03-30 ENCOUNTER — Other Ambulatory Visit: Payer: Self-pay

## 2022-03-30 DIAGNOSIS — R109 Unspecified abdominal pain: Secondary | ICD-10-CM | POA: Diagnosis not present

## 2022-03-30 DIAGNOSIS — K429 Umbilical hernia without obstruction or gangrene: Secondary | ICD-10-CM | POA: Diagnosis not present

## 2022-03-30 DIAGNOSIS — K5900 Constipation, unspecified: Secondary | ICD-10-CM | POA: Diagnosis not present

## 2022-03-30 DIAGNOSIS — R1084 Generalized abdominal pain: Secondary | ICD-10-CM | POA: Diagnosis present

## 2022-03-30 DIAGNOSIS — K5909 Other constipation: Secondary | ICD-10-CM

## 2022-03-30 DIAGNOSIS — Z7982 Long term (current) use of aspirin: Secondary | ICD-10-CM | POA: Diagnosis not present

## 2022-03-30 DIAGNOSIS — I10 Essential (primary) hypertension: Secondary | ICD-10-CM | POA: Diagnosis not present

## 2022-03-30 DIAGNOSIS — N200 Calculus of kidney: Secondary | ICD-10-CM | POA: Diagnosis not present

## 2022-03-30 DIAGNOSIS — I700 Atherosclerosis of aorta: Secondary | ICD-10-CM | POA: Diagnosis not present

## 2022-03-30 DIAGNOSIS — K529 Noninfective gastroenteritis and colitis, unspecified: Secondary | ICD-10-CM | POA: Insufficient documentation

## 2022-03-30 HISTORY — DX: Unspecified cirrhosis of liver: B19.10

## 2022-03-30 HISTORY — DX: Unspecified viral hepatitis B without hepatic coma: K74.60

## 2022-03-30 LAB — CBC
HCT: 46.9 % (ref 39.0–52.0)
Hemoglobin: 15.3 g/dL (ref 13.0–17.0)
MCH: 28.6 pg (ref 26.0–34.0)
MCHC: 32.6 g/dL (ref 30.0–36.0)
MCV: 87.7 fL (ref 80.0–100.0)
Platelets: 228 10*3/uL (ref 150–400)
RBC: 5.35 MIL/uL (ref 4.22–5.81)
RDW: 14.7 % (ref 11.5–15.5)
WBC: 9 10*3/uL (ref 4.0–10.5)
nRBC: 0 % (ref 0.0–0.2)

## 2022-03-30 LAB — URINALYSIS, ROUTINE W REFLEX MICROSCOPIC
Bacteria, UA: NONE SEEN
Bilirubin Urine: NEGATIVE
Glucose, UA: >=500 mg/dL — AB
Hgb urine dipstick: NEGATIVE
Ketones, ur: 5 mg/dL — AB
Leukocytes,Ua: NEGATIVE
Nitrite: NEGATIVE
Protein, ur: 30 mg/dL — AB
Specific Gravity, Urine: 1.018 (ref 1.005–1.030)
pH: 7 (ref 5.0–8.0)

## 2022-03-30 LAB — COMPREHENSIVE METABOLIC PANEL
ALT: 22 U/L (ref 0–44)
AST: 23 U/L (ref 15–41)
Albumin: 4.8 g/dL (ref 3.5–5.0)
Alkaline Phosphatase: 61 U/L (ref 38–126)
Anion gap: 10 (ref 5–15)
BUN: 15 mg/dL (ref 8–23)
CO2: 25 mmol/L (ref 22–32)
Calcium: 9.6 mg/dL (ref 8.9–10.3)
Chloride: 104 mmol/L (ref 98–111)
Creatinine, Ser: 0.72 mg/dL (ref 0.61–1.24)
GFR, Estimated: >60 mL/min (ref >60)
Glucose, Bld: 149 mg/dL — ABNORMAL HIGH (ref 70–99)
Potassium: 3.5 mmol/L (ref 3.5–5.1)
Sodium: 139 mmol/L (ref 135–145)
Total Bilirubin: 0.8 mg/dL (ref 0.3–1.2)
Total Protein: 8.7 g/dL — ABNORMAL HIGH (ref 6.5–8.1)

## 2022-03-30 LAB — I-STAT CHEM 8, ED
BUN: 13 mg/dL (ref 8–23)
Calcium, Ion: 1.17 mmol/L (ref 1.15–1.40)
Chloride: 104 mmol/L (ref 98–111)
Creatinine, Ser: 0.5 mg/dL — ABNORMAL LOW (ref 0.61–1.24)
Glucose, Bld: 140 mg/dL — ABNORMAL HIGH (ref 70–99)
HCT: 42 % (ref 39.0–52.0)
Hemoglobin: 14.3 g/dL (ref 13.0–17.0)
Potassium: 3.6 mmol/L (ref 3.5–5.1)
Sodium: 141 mmol/L (ref 135–145)
TCO2: 24 mmol/L (ref 22–32)

## 2022-03-30 LAB — LIPASE, BLOOD: Lipase: 28 U/L (ref 11–51)

## 2022-03-30 MED ORDER — AMOXICILLIN-POT CLAVULANATE 875-125 MG PO TABS: 1.0000 | ORAL_TABLET | Freq: Two times a day (BID) | ORAL | 0 refills | Status: DC

## 2022-03-30 MED ORDER — SODIUM CHLORIDE 0.9 % IV BOLUS
1000.0000 mL | Freq: Once | INTRAVENOUS | Status: AC
  Administered 2022-03-30: 1000 mL via INTRAVENOUS

## 2022-03-30 MED ORDER — BISACODYL 10 MG RE SUPP
10.0000 mg | Freq: Once | RECTAL | Status: AC
  Administered 2022-03-30: 10 mg via RECTAL
  Filled 2022-03-30: qty 1

## 2022-03-30 MED ORDER — AMOXICILLIN-POT CLAVULANATE 875-125 MG PO TABS
1.0000 | ORAL_TABLET | Freq: Once | ORAL | Status: AC
  Administered 2022-03-30: 1 via ORAL
  Filled 2022-03-30: qty 1

## 2022-03-30 MED ORDER — HYDROMORPHONE HCL 1 MG/ML IJ SOLN
0.5000 mg | Freq: Once | INTRAMUSCULAR | Status: AC
  Administered 2022-03-30: 0.5 mg via INTRAVENOUS
  Filled 2022-03-30: qty 1

## 2022-03-30 MED ORDER — ONDANSETRON HCL 4 MG/2ML IJ SOLN
4.0000 mg | Freq: Once | INTRAMUSCULAR | Status: AC
  Administered 2022-03-30: 4 mg via INTRAVENOUS
  Filled 2022-03-30: qty 2

## 2022-03-30 MED ORDER — IOHEXOL 300 MG/ML  SOLN
100.0000 mL | Freq: Once | INTRAMUSCULAR | Status: AC | PRN
  Administered 2022-03-30: 100 mL via INTRAVENOUS

## 2022-03-30 NOTE — Discharge Instructions (Addendum)
It was our pleasure to provide your ER care today - we hope that you feel better.  If feeling constipated, make sure to drink plenty of fluids/stay well hydrated. Get adequate fiber in diet. Take colace (stool softener) 2x/day, and take miralax (laxative) once per day as need.   For hernia, follow up with general surgeon in the next couple weeks - call office Monday to arrange follow up appointment.   Your ct also made note of colitis - take antibiotic as prescribed.  For your high blood pressure, take your meds as prescribed, follow heart health eating plan, and follow up with your doctor in 1-2 weeks.   Return to ER right away if worse, new symptoms, high fevers, new or worsening or severe abdominal pain, persistent vomiting, chest pain, trouble breathing, or other emergency concern.

## 2022-03-30 NOTE — ED Notes (Signed)
Sent urine and culture

## 2022-03-30 NOTE — ED Provider Notes (Signed)
[place] EMERGENCY DEPARTMENT AT [facility] Provider Note   CSN: [id number] Arrival date & time: 03/30/22  1713     History  Chief Complaint  Patient presents with   Abdominal Pain    Phillip Paul is a 72 y.o. male.  Pt c/o abd pain and nausea/vomiting. Symptoms acute onset today, moderate/sev, persistent, generalized, without specific exacerbating or alleviating factors. Emesis color of recently ingested food/fluids, not bloody. Abd feels distended. Last normal bm was 3 days ago. No dysuria or hematuria. No scrotal or testicular pain or swelling. Remote hx hernia repair, denies other surgery. Hx hepatitis c. No fever, chills or sweats.   The history is provided by the patient, medical records and a friend.  Abdominal Pain Associated symptoms: nausea and vomiting   Associated symptoms: no chest pain, no chills, no cough, no dysuria, no fever, no shortness of breath and no sore throat        Home Medications Prior to Admission medications   Medication Sig Start Date End Date Taking? Authorizing Provider  albuterol (VENTOLIN HFA) 108 (90 Base) MCG/ACT inhaler Inhale 2 puffs into the lungs every 6 (six) hours as needed for wheezing or shortness of breath. 02/26/22   Gildardo Pounds, NP  amLODipine (NORVASC) 10 MG tablet Take 1 tablet (10 mg total) by mouth daily. 11/26/21   Gildardo Pounds, NP  aspirin EC 81 MG tablet Take 1 tablet (81 mg total) by mouth daily. Swallow whole. 05/30/21   Jerline Pain, MD  Blood Glucose Monitoring Suppl (ONE TOUCH ULTRA 2) w/Device KIT Check blood sugar by fingerstick 2x a day. ICD10 E11.8 and E11.65 06/02/18   Charlott Rakes, MD  busPIRone (BUSPAR) 15 MG tablet Take 1 tablet (15 mg total) by mouth 2 (two) times daily. FOR ANXIETY 02/26/22   Gildardo Pounds, NP  cholecalciferol (VITAMIN D) 1000 units tablet Take 1,000 Units by mouth daily.    [provider]  ciclopirox (LOPROX) 0.77 % cream Apply topically 2 (two)  times daily. 09/13/21   Standiford, Nena Alexander, DPM  ciclopirox (PENLAC) 8 % solution Apply topically at bedtime. Apply over nail and surrounding skin. Apply daily over previous coat. After seven (7) days, may remove with alcohol and continue cycle. 12/20/21   Standiford, Nena Alexander, DPM  ciclopirox (PENLAC) 8 % solution Apply over nail and surrounding skin at bedtime. Apply daily over previous coat. After seven (7) days, may remove with alcohol and continue cycle. 01/24/22   Standiford, Nena Alexander, DPM  COENZYME Q10 PO Take by mouth.    [provider]  dapagliflozin propanediol (FARXIGA) 10 MG TABS tablet Take 1 tablet (10 mg total) by mouth daily. 11/26/21   Gildardo Pounds, NP  Dulaglutide (TRULICITY) 1.5 0000000 SOPN Inject 1.5 mg into the skin once a week. 02/05/22   Charlott Rakes, MD  fish oil-omega-3 fatty acids 1000 MG capsule Take 1 g by mouth daily.    [provider]  fluticasone (FLONASE) 50 MCG/ACT nasal spray Use 2 spray(s) in each nostril once daily 03/19/22   Charlott Rakes, MD  gabapentin (NEURONTIN) 600 MG tablet Take 1 tablet (600 mg total) by mouth 3 (three) times daily. 03/12/22   Gildardo Pounds, NP  glucose blood [facility] ULTRA) test strip USE AS DIRECTED TWICE DAILY 03/11/22   Gildardo Pounds, NP  Insulin Glargine [facility]) 100 UNIT/ML Inject 10 Units into the skin daily. 02/05/22   Charlott Rakes, MD  Insulin Pen Needle 31G  X 5 MM MISC Use as instructed. 05/02/20   Gildardo Pounds, NP  losartan (COZAAR) 100 MG tablet Take 1 tablet (100 mg total) by mouth daily. 03/25/22   Charlott Rakes, MD  metFORMIN (GLUCOPHAGE) 500 MG tablet Take 1 tablet (500 mg total) by mouth 2 (two) times daily with a meal. 08/22/21   Gildardo Pounds, NP  [facility]  Anti-fungal (nail)-#1    [provider]  omeprazole (PRILOSEC) 40 MG capsule Take 1 capsule by mouth once daily 01/25/22   Gildardo Pounds, NP  ondansetron (ZOFRAN) 4 MG  tablet Take 1 tablet (4 mg total) by mouth every 8 (eight) hours as needed for nausea or vomiting. 02/26/22   Gildardo Pounds, NP  oxyCODONE (ROXICODONE) 5 MG immediate release tablet Take 1 tablet (5 mg total) by mouth every 4 (four) hours as needed for severe pain. 01/16/22   Standiford, Nena Alexander, DPM  rosuvastatin (CRESTOR) 20 MG tablet Take 1 tablet (20 mg total) by mouth daily. 06/22/21   Jerline Pain, MD  tobramycin-dexamethasone [facility]) ophthalmic solution Instill one drop OD QID x 5 days 12/20/21   [provider]  TRUEPLUS LANCETS 28G MISC Use as instructed 05/07/17   Gildardo Pounds, NP      Allergies    Tylenol [acetaminophen]    Review of Systems   Review of Systems  Constitutional:  Negative for chills and fever.  HENT:  Negative for sore throat.   Eyes:  Negative for redness.  Respiratory:  Negative for cough and shortness of breath.   Cardiovascular:  Negative for chest pain.  Gastrointestinal:  Positive for abdominal pain, nausea and vomiting.  Genitourinary:  Negative for dysuria, flank pain, scrotal swelling and testicular pain.  Musculoskeletal:  Negative for back pain and neck pain.  Skin:  Negative for rash.  Neurological:  Negative for headaches.  Hematological:  Does not bruise/bleed easily.  Psychiatric/Behavioral:  Negative for confusion.     Physical Exam Updated Vital Signs BP (!) 146/102   Pulse (!) 101   Temp 97.7 F (36.5 C) (Oral)   Resp 20   Ht 1.803 m (5\' 11" )   Wt 94.3 kg   SpO2 93%   BMI 29.01 kg/m  Physical Exam Vitals and nursing note reviewed.  Constitutional:      Appearance: Normal appearance. He is well-developed.  HENT:     Head: Atraumatic.     Nose: Nose normal.     Mouth/Throat:     Mouth: Mucous membranes are moist.     Pharynx: Oropharynx is clear.  Eyes:     General: No scleral icterus.    Conjunctiva/sclera: Conjunctivae normal.  Neck:     Trachea: No tracheal deviation.  Cardiovascular:     Rate and  Rhythm: Normal rate and regular rhythm.     Pulses: Normal pulses.     Heart sounds: Normal heart sounds. No murmur heard.    No friction rub. No gallop.  Pulmonary:     Effort: Pulmonary effort is normal. No accessory muscle usage or respiratory distress.     Breath sounds: Normal breath sounds.  Abdominal:     General: Bowel sounds are normal. There is distension.     Palpations: Abdomen is soft. There is no mass.     Tenderness: There is abdominal tenderness. There is no guarding or rebound.     Comments: Abd distended and tender. No incarcerated hernia felt - there is a small, not  painful, non tender, umbilical hernia that does not appear acutely incarcerated.   Genitourinary:    Comments: No cva tenderness. Normal external gu exam - no scrotal or testicular pain, swelling or tenderness.  Musculoskeletal:        General: No swelling or tenderness.     Cervical back: Normal range of motion and neck supple. No rigidity.  Skin:    General: Skin is warm and dry.     Findings: No rash.  Neurological:     Mental Status: He is alert.     Comments: Alert, speech clear.   Psychiatric:        Mood and Affect: Mood normal.     ED Results / Procedures / Treatments   Labs (all labs ordered are listed, but only abnormal results are displayed) Results for orders placed or performed during the hospital encounter of 03/30/22  Comprehensive metabolic panel  Result Value Ref Range   Sodium 139 135 - 145 mmol/L   Potassium 3.5 3.5 - 5.1 mmol/L   Chloride 104 98 - 111 mmol/L   CO2 25 22 - 32 mmol/L   Glucose, Bld 149 (H) 70 - 99 mg/dL   BUN 15 8 - 23 mg/dL   Creatinine, Ser 0.72 0.61 - 1.24 mg/dL   Calcium 9.6 8.9 - 10.3 mg/dL   Total Protein 8.7 (H) 6.5 - 8.1 g/dL   Albumin 4.8 3.5 - 5.0 g/dL   AST 23 15 - 41 U/L   ALT 22 0 - 44 U/L   Alkaline Phosphatase 61 38 - 126 U/L   Total Bilirubin 0.8 0.3 - 1.2 mg/dL   GFR, Estimated >60 >60 mL/min   Anion gap 10 5 - 15  CBC  Result Value  Ref Range   WBC 9.0 4.0 - 10.5 K/uL   RBC 5.35 4.22 - 5.81 MIL/uL   Hemoglobin 15.3 13.0 - 17.0 g/dL   HCT 46.9 39.0 - 52.0 %   MCV 87.7 80.0 - 100.0 fL   MCH 28.6 26.0 - 34.0 pg   MCHC 32.6 30.0 - 36.0 g/dL   RDW 14.7 11.5 - 15.5 %   Platelets 228 150 - 400 K/uL   nRBC 0.0 0.0 - 0.2 %  Lipase, blood  Result Value Ref Range   Lipase 28 11 - 51 U/L  Urinalysis, Routine w reflex microscopic -Urine, Clean Catch  Result Value Ref Range   Color, Urine YELLOW YELLOW   APPearance CLEAR CLEAR   Specific Gravity, Urine 1.018 1.005 - 1.030   pH 7.0 5.0 - 8.0   Glucose, UA >=500 (A) NEGATIVE mg/dL   Hgb urine dipstick NEGATIVE NEGATIVE   Bilirubin Urine NEGATIVE NEGATIVE   Ketones, ur 5 (A) NEGATIVE mg/dL   Protein, ur 30 (A) NEGATIVE mg/dL   Nitrite NEGATIVE NEGATIVE   Leukocytes,Ua NEGATIVE NEGATIVE   RBC / HPF 0-5 0 - 5 RBC/hpf   WBC, UA 0-5 0 - 5 WBC/hpf   Bacteria, UA NONE SEEN NONE SEEN   Squamous Epithelial / HPF 0-5 0 - 5 /HPF  I-stat chem 8, ED  Result Value Ref Range   Sodium 141 135 - 145 mmol/L   Potassium 3.6 3.5 - 5.1 mmol/L   Chloride 104 98 - 111 mmol/L   BUN 13 8 - 23 mg/dL   Creatinine, Ser 0.50 (L) 0.61 - 1.24 mg/dL   Glucose, Bld 140 (H) 70 - 99 mg/dL   Calcium, Ion 1.17 1.15 - 1.40 mmol/L   TCO2 24 22 -  32 mmol/L   Hemoglobin 14.3 13.0 - 17.0 g/dL   HCT 42.0 39.0 - 52.0 %   CT Abdomen Pelvis W Contrast  Result Date: 03/30/2022 CLINICAL DATA:  Abdominal pain, constipation. EXAM: CT ABDOMEN AND PELVIS WITH CONTRAST TECHNIQUE: Multidetector CT imaging of the abdomen and pelvis was performed using the standard protocol following bolus administration of intravenous contrast. RADIATION DOSE REDUCTION: This exam was performed according to the departmental dose-optimization program which includes automated exposure control, adjustment of the mA and/or kV according to patient size and/or use of iterative reconstruction technique. CONTRAST:  170mL OMNIPAQUE IOHEXOL 300  MG/ML  SOLN COMPARISON:  Abdominal ultrasound dated 03/26/2022. FINDINGS: Lower chest: There is mild bibasilar atelectasis. Hepatobiliary: No focal liver abnormality is seen. No gallstones, gallbladder wall thickening, or biliary dilatation. Pancreas: Unremarkable. No pancreatic ductal dilatation or surrounding inflammatory changes. 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 Adrenals/Urinary Tract: Adrenal glands are unremarkable. A nonobstructive left renal calculus measures 5 mm. There is no renal calculus on the right. No hydronephrosis on either side. Bladder is unremarkable. Stomach/Bowel: Stomach is within normal limits. Appendix appears normal. There is minimal fat stranding and edema surrounding the descending colon with mild bowel wall thickening. No evidence of bowel obstruction. Vascular/Lymphatic: Aortic atherosclerosis. No enlarged abdominal or pelvic lymph nodes. Reproductive: Prostate is unremarkable. Other: There is an umbilical hernia containing a single wall of small bowel (Richter hernia). A small amount of fluid within the hernia sac may reflect ischemia. No free intraperitoneal fluid. Musculoskeletal: Degenerative changes are seen in the spine. IMPRESSION: 1. Mild wall thickening and fat stranding surrounding the descending colon likely reflects an infectious/inflammatory colitis. 2. Umbilical hernia containing a single wall of small bowel (Richter hernia). A small amount of fluid within the hernia sac may reflect ischemia. No bowel obstruction. Aortic Atherosclerosis (ICD10-I70.0). Electronically Signed   By: Zerita Boers M.D.   On: 03/30/2022 20:02   US Abdomen Limited RUQ (LIVER/GB)  Result Date: 03/26/2022 CLINICAL DATA:  Elevated LFTs.  Hepatitis C. EXAM: ULTRASOUND ABDOMEN LIMITED RIGHT UPPER QUADRANT COMPARISON:  None Available. FINDINGS: Gallbladder: Echogenic plaque like material is identified in the dependent portion of the gallbladder measuring 3.5 cm in length. Two  suspected polyps were identified on the previous study but only this single area is seen today. The gallbladder is otherwise normal. Common bile duct: Diameter: 4.5 mm Liver: Diffuse increased heterogeneous echogenicity. No focal mass. Portal vein is patent on color Doppler imaging with normal direction of blood flow towards the liver. Other: None. IMPRESSION: 1. Echogenic plaque like material in the dependent portion of the gallbladder measuring 3.5 cm in length. This could represent a large polyp or adherent sludge. Given the significant interval difference since February 02, 2021, recommend a follow-up ultrasound in 3 months. If the finding persists, recommend surgical consultation. 2. Diffuse increased heterogeneous echogenicity in the liver is nonspecific but could be due to reported cirrhosis or hepatic steatosis. These results will be called to the ordering clinician or representative by the Radiologist Assistant, and communication documented in the PACS or [employer]. Electronically Signed   By: Dorise Bullion III M.D.   On: 03/26/2022 12:04     EKG None  Radiology CT Abdomen Pelvis W Contrast  Result Date: 03/30/2022 CLINICAL DATA:  Abdominal pain, constipation. EXAM: CT ABDOMEN AND PELVIS WITH CONTRAST TECHNIQUE: Multidetector CT imaging of the abdomen and pelvis was performed using the standard protocol following bolus administration of intravenous contrast. RADIATION DOSE REDUCTION: This  exam was performed according to the departmental dose-optimization program which includes automated exposure control, adjustment of the mA and/or kV according to patient size and/or use of iterative reconstruction technique. CONTRAST:  124mL OMNIPAQUE IOHEXOL 300 MG/ML  SOLN COMPARISON:  Abdominal ultrasound dated 03/26/2022. FINDINGS: Lower chest: There is mild bibasilar atelectasis. Hepatobiliary: No focal liver abnormality is seen. No gallstones, gallbladder wall thickening, or biliary dilatation.  Pancreas: Unremarkable. No pancreatic ductal dilatation or surrounding inflammatory changes. 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 Adrenals/Urinary Tract: Adrenal glands are unremarkable. A nonobstructive left renal calculus measures 5 mm. There is no renal calculus on the right. No hydronephrosis on either side. Bladder is unremarkable. Stomach/Bowel: Stomach is within normal limits. Appendix appears normal. There is minimal fat stranding and edema surrounding the descending colon with mild bowel wall thickening. No evidence of bowel obstruction. Vascular/Lymphatic: Aortic atherosclerosis. No enlarged abdominal or pelvic lymph nodes. Reproductive: Prostate is unremarkable. Other: There is an umbilical hernia containing a single wall of small bowel (Richter hernia). A small amount of fluid within the hernia sac may reflect ischemia. No free intraperitoneal fluid. Musculoskeletal: Degenerative changes are seen in the spine. IMPRESSION: 1. Mild wall thickening and fat stranding surrounding the descending colon likely reflects an infectious/inflammatory colitis. 2. Umbilical hernia containing a single wall of small bowel (Richter hernia). A small amount of fluid within the hernia sac may reflect ischemia. No bowel obstruction. Aortic Atherosclerosis (ICD10-I70.0). Electronically Signed   By: Zerita Boers M.D.   On: 03/30/2022 20:02    Procedures Procedures    Medications Ordered in ED Medications  sodium chloride 0.9 % bolus 1,000 mL (0 mLs Intravenous Stopped 03/30/22 1956)  ondansetron (ZOFRAN) injection 4 mg (4 mg Intravenous Given 03/30/22 1817)  HYDROmorphone (DILAUDID) injection 0.5 mg (0.5 mg Intravenous Given 03/30/22 1817)  iohexol (OMNIPAQUE) 300 MG/ML solution 100 mL (100 mLs Intravenous Contrast Given 03/30/22 1936)    ED Course/ Medical Decision Making/ A&P                             Medical Decision Making Problems Addressed: Abdominal pain, generalized: acute illness or  injury with systemic symptoms that poses a threat to life or bodily functions Colitis: acute illness or injury with systemic symptoms that poses a threat to life or bodily functions Essential hypertension: chronic illness or injury that poses a threat to life or bodily functions Other constipation: acute illness or injury with systemic symptoms Umbilical hernia without obstruction and without gangrene: chronic illness or injury  Amount and/or Complexity of Data Reviewed Independent Historian: friend External Data Reviewed: notes. Labs: ordered. Decision-making details documented in ED Course. Radiology: ordered and independent interpretation performed. Decision-making details documented in ED Course. Discussion of management or test interpretation with external provider(s): Gen surg  Risk Prescription drug management. Parenteral controlled substances. Decision regarding hospitalization.   Iv ns. Continuous pulse ox and cardiac monitoring. Labs ordered/sent. Imaging ordered.   Differential diagnosis includes sbo, diverticulitis, acute abd, etc . Dispo decision including potential need for admission considered - will get labs and imaging and reassess.   Reviewed nursing notes and prior charts for additional history. External reports reviewed. Additional history from: spouse/fam.   Cardiac monitor: sinus rhythm, rate 98.  Dilaudid iv, zofran iv, ivf bolus. Ct pending.   Labs reviewed/interpreted by me - wbc normal.   CT reviewed/interpreted by me - small umbilical hernia (?Richters per radiology read), ?mild colitis.  Gen surgery consulted given rad ct read - discussed pt and w Dr Marcello Moores who reviewed ct  - she indicates nothing emergent to do as relates umbilical hernia, can f/u as outpatient.   After ct pt reports going to bathroom, passing gas/bm, and feeling improved from prior. Mild left abd tenderness. No peritoneal signs.   Rx augmentin. If constipation rec colace/miralax  prn.  Recheck, tolerating po. No nausea/vomiting. Symptoms improved.  Abd soft non tender. Pt requests d/c, indicates does not feel he needs to stay in hospital. Denies persistent nausea. No vomiting. Denies chest pain. No sob. Denies increased cough or uri symptoms. No fever or chills.   Pt currently appear stable for d/c.   Rec pcp f/u.  Return precautions provided.           Final Clinical Impression(s) / ED Diagnoses Final diagnoses:  None    Rx / DC Orders ED Discharge Orders     None         Lajean Saver, MD 03/30/22 2105

## 2022-03-30 NOTE — ED Triage Notes (Signed)
States that his belly pain has gotten worse of the past couple of days, states now he is nauseous, and unable to keep anything down. Also states that his belly has become hard.

## 2022-03-30 NOTE — ED Notes (Signed)
Patient transported to CT 

## 2022-03-30 NOTE — ED Notes (Signed)
2L of O2 applied to the patient.

## 2022-04-24 DIAGNOSIS — H5203 Hypermetropia, bilateral: Secondary | ICD-10-CM | POA: Diagnosis not present

## 2022-04-24 DIAGNOSIS — H524 Presbyopia: Secondary | ICD-10-CM | POA: Diagnosis not present

## 2022-04-24 DIAGNOSIS — H52203 Unspecified astigmatism, bilateral: Secondary | ICD-10-CM | POA: Diagnosis not present

## 2022-04-26 ENCOUNTER — Encounter: Payer: Self-pay | Admitting: Nurse Practitioner

## 2022-04-27 ENCOUNTER — Other Ambulatory Visit: Payer: Self-pay | Admitting: Nurse Practitioner

## 2022-04-27 DIAGNOSIS — B9689 Other specified bacterial agents as the cause of diseases classified elsewhere: Secondary | ICD-10-CM

## 2022-04-27 DIAGNOSIS — E118 Type 2 diabetes mellitus with unspecified complications: Secondary | ICD-10-CM

## 2022-04-27 MED ORDER — MUPIROCIN 2 % EX OINT: 1.0000 | TOPICAL_OINTMENT | Freq: Two times a day (BID) | CUTANEOUS | 0 refills | Status: DC

## 2022-05-04 ENCOUNTER — Other Ambulatory Visit: Payer: Self-pay | Admitting: Nurse Practitioner

## 2022-05-04 DIAGNOSIS — K219 Gastro-esophageal reflux disease without esophagitis: Secondary | ICD-10-CM

## 2022-05-06 NOTE — Telephone Encounter (Signed)
Requested Prescriptions  Pending Prescriptions Disp Refills   omeprazole (PRILOSEC) 40 MG capsule [Pharmacy Med Name: Omeprazole 40 MG Oral Capsule Delayed Release] 30 capsule 1    Sig: Take 1 capsule by mouth once daily     Gastroenterology: Proton Pump Inhibitors Passed - 05/04/2022  6:54 AM      Passed - Valid encounter within last 12 months    Recent Outpatient Visits           2 months ago Controlled type 2 diabetes mellitus with complication, with long-term current use of insulin [facility])   [facility] [place], [place] W, NP   3 months ago Controlled type 2 diabetes mellitus with complication, with long-term current use of insulin [facility])   [facility] [place], [place] L, RPH-CPP   4 months ago Controlled type 2 diabetes mellitus with complication, with long-term current use of insulin [facility])   [place] [facility] [place], [place] L, RPH-CPP   5 months ago Controlled type 2 diabetes mellitus with complication, with long-term current use of insulin [facility])   [facility] [place], Shea Stakes, NP   8 months ago Encounter for annual physical exam   [facility] [place], Shea Stakes, NP       Future Appointments             In 3 weeks Claiborne Rigg, NP [facility]

## 2022-05-18 ENCOUNTER — Other Ambulatory Visit: Payer: Self-pay | Admitting: Nurse Practitioner

## 2022-05-18 DIAGNOSIS — I10 Essential (primary) hypertension: Secondary | ICD-10-CM

## 2022-05-27 ENCOUNTER — Encounter: Payer: Self-pay | Admitting: Nurse Practitioner

## 2022-05-27 ENCOUNTER — Ambulatory Visit: Payer: Medicare Other | Attending: Nurse Practitioner | Admitting: Nurse Practitioner

## 2022-05-27 VITALS — BP 141/71 | HR 77 | Ht 70.0 in | Wt 211.0 lb

## 2022-05-27 DIAGNOSIS — Z794 Long term (current) use of insulin: Secondary | ICD-10-CM | POA: Diagnosis not present

## 2022-05-27 DIAGNOSIS — Z8249 Family history of ischemic heart disease and other diseases of the circulatory system: Secondary | ICD-10-CM | POA: Insufficient documentation

## 2022-05-27 DIAGNOSIS — Z23 Encounter for immunization: Secondary | ICD-10-CM

## 2022-05-27 DIAGNOSIS — I10 Essential (primary) hypertension: Secondary | ICD-10-CM | POA: Diagnosis not present

## 2022-05-27 DIAGNOSIS — R1084 Generalized abdominal pain: Secondary | ICD-10-CM

## 2022-05-27 DIAGNOSIS — E118 Type 2 diabetes mellitus with unspecified complications: Secondary | ICD-10-CM | POA: Diagnosis not present

## 2022-05-27 DIAGNOSIS — D2121 Benign neoplasm of connective and other soft tissue of right lower limb, including hip: Secondary | ICD-10-CM | POA: Diagnosis not present

## 2022-05-27 DIAGNOSIS — E119 Type 2 diabetes mellitus without complications: Secondary | ICD-10-CM | POA: Diagnosis present

## 2022-05-27 LAB — POCT GLYCOSYLATED HEMOGLOBIN (HGB A1C): HbA1c, POC (controlled diabetic range): 6.6 % (ref 0.0–7.0)

## 2022-05-27 MED ORDER — ZOSTER VAC RECOMB ADJUVANTED 50 MCG/0.5ML IM SUSR: 0.5000 mL | Freq: Once | INTRAMUSCULAR | 0 refills | Status: AC

## 2022-05-27 NOTE — Progress Notes (Signed)
Assessment & Plan:  Phillip Paul was seen today for diabetes and hypertension.  Diagnoses and all orders for this visit:  Controlled type 2 diabetes mellitus with complication, with long-term current use of insulin (HCC) -     POCT glycosylated hemoglobin (Hb A1C)  Primary hypertension Check blood pressure at home 2-3 times a week and bring blood pressure log at the next office visit.  Continue all medications as prescribed.  Need for shingles vaccine -     Zoster Vaccine Adjuvanted [facility]) injection; Inject 0.5 mLs into the muscle once for 1 dose.  Fibroma of right foot Request second opinion -     Ambulatory referral to Podiatry  Generalized abdominal pain -     H. pylori breath test; Future    Patient has been counseled on age-appropriate routine health concerns for screening and prevention. These are reviewed and up-to-date. Referrals have been placed accordingly. Immunizations are up-to-date or declined.    Subjective:   Chief Complaint  Patient presents with   Diabetes   Hypertension   HPI Phillip Paul 72 y.o. male presents to office today for follow up to DM and HTN   DM 2 Diabetes is well-controlled. He stopped taking metformin due to reported side effects of weakness.  At this time he is only taking [place] and [address]  He has started started taking an OTC supplement called sugar defender.  States he was also instructed after an emergency room visit a few months ago for abdominal pain to stop taking Trulicity.  The emergency room visit was on March 30, 2022 and the discharge diagnosis was colitis.  He did not stop taking Trulicity but would like my advice today as he continues with mild abdominal pain. Lab Results  Component Value Date   HGBA1C 6.6 05/27/2022     HTN Blood pressure is elevated today.  He does have a blood pressure monitor at home and reports lower readings at home compared to today's office reading.  He endorses adherence  taking amlodipine 10 mg daily and losartan 100 mg daily. BP Readings from Last 3 Encounters:  05/27/22 (!) 141/71  03/30/22 (!) 146/102  02/26/22 (!) 148/82     He is currently being followed by podiatry for history of right foot fibroma.  States that since surgical procedure he has been experiencing lower extremity swelling and pain has not improved in the plantar aspect of the right foot.   Review of Systems  Constitutional:  Negative for fever, malaise/fatigue and weight loss.  HENT: Negative.  Negative for nosebleeds.   Eyes: Negative.  Negative for blurred vision, double vision and photophobia.  Respiratory: Negative.  Negative for cough and shortness of breath.   Cardiovascular:  Positive for leg swelling (Right lower extremity). Negative for chest pain and palpitations.  Gastrointestinal:  Positive for abdominal pain. Negative for blood in stool, constipation, diarrhea, heartburn, melena, nausea and vomiting.  Musculoskeletal:  Negative for myalgias.       Right foot pain  Neurological: Negative.  Negative for dizziness, focal weakness, seizures and headaches.  Psychiatric/Behavioral: Negative.  Negative for suicidal ideas.     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)    Diabetes mellitus without complication (HCC)    Dizzy    Hypertension    Kidney stones    Racing heart beat     Past  Surgical History:  Procedure Laterality Date   CYSTOSCOPY     LEFT URETEROSCOPIC STONE MANIPULATION AND REMOVAL; PLACEMENT OF  LEFT DOUBLE-J URETERAL STENT   CYSTOSCOPY W/ URETERAL STENT PLACEMENT     LEFT DOUBLE-J URETERAL STENT   ORIF ANKLE FRACTURE Left 08/17/2012   Procedure: OPEN REDUCTION INTERNAL FIXATION (ORIF) ANKLE FRACTURE/Left;  Surgeon: Toni Arthurs, MD;  Location: [facility] OR;  Service: Orthopedics;  Laterality: Left;   TIBIA IM NAIL INSERTION Left 08/17/2012   Procedure: INTRAMEDULLARY (IM) NAIL  TIBIAL/Left;  Surgeon: Toni Arthurs, MD;  Location: [facility] OR;  Service: Orthopedics;  Laterality: Left;    Family History  Problem Relation Age of Onset   Hypertension Father        lived to 29   Other Brother        MVA   Kidney disease Sister    Other Brother        Stomach problems    Social History Reviewed with no changes to be made today.   Outpatient Medications Prior to Visit  Medication Sig Dispense Refill   albuterol (VENTOLIN HFA) 108 (90 Base) MCG/ACT inhaler Inhale 2 puffs into the lungs every 6 (six) hours as needed for wheezing or shortness of breath. 8 g 2   amLODipine (NORVASC) 10 MG tablet Take 1 tablet by mouth once daily 90 tablet 0   aspirin EC 81 MG tablet Take 1 tablet (81 mg total) by mouth daily. Swallow whole. 90 tablet 3   Blood Glucose Monitoring Suppl (ONE TOUCH ULTRA 2) w/Device KIT Check blood sugar by fingerstick 2x a day. ICD10 E11.8 and E11.65 1 each 0   busPIRone (BUSPAR) 15 MG tablet Take 1 tablet (15 mg total) by mouth 2 (two) times daily. FOR ANXIETY 60 tablet 3   cholecalciferol (VITAMIN D) 1000 units tablet Take 1,000 Units by mouth daily.     COENZYME Q10 PO Take by mouth.     dapagliflozin propanediol (FARXIGA) 10 MG TABS tablet Take 1 tablet (10 mg total) by mouth daily. 90 tablet 1   fish oil-omega-3 fatty acids 1000 MG capsule Take 1 g by mouth daily.     fluticasone (FLONASE) 50 MCG/ACT nasal spray Use 2 spray(s) in each nostril once daily 48 g 0   gabapentin (NEURONTIN) 600 MG tablet Take 1 tablet (600 mg total) by mouth 3 (three) times daily. 270 tablet 1   glucose blood (ONETOUCH ULTRA) test strip USE AS DIRECTED TWICE DAILY 200 each 1   Insulin Glargine (BASAGLAR KWIKPEN) 100 UNIT/ML Inject 10 Units into the skin daily. 15 mL 2   Insulin Pen Needle 31G X 5 MM MISC Use as instructed. 100 each 12   losartan (COZAAR) 100 MG tablet Take 1 tablet (100 mg total) by mouth daily. 90 tablet 1   mupirocin ointment (BACTROBAN) 2 % Apply 1  Application topically 2 (two) times daily. 30 g 0   NON FORMULARY [facility]  Anti-fungal (nail)-#1     omeprazole (PRILOSEC) 40 MG capsule Take 1 capsule by mouth once daily 30 capsule 1   ondansetron (ZOFRAN) 4 MG tablet Take 1 tablet (4 mg total) by mouth every 8 (eight) hours as needed for nausea or vomiting. 30 tablet 1   rosuvastatin (CRESTOR) 20 MG tablet Take 1 tablet (20 mg total) by mouth daily. 90 tablet 3   tobramycin-dexamethasone (TOBRADEX) ophthalmic solution Instill one drop OD QID x 5 days     TRUEPLUS LANCETS 28G MISC Use as instructed 100 each  3   Dulaglutide (TRULICITY) 1.5 MG/0.5ML SOPN Inject 1.5 mg into the skin once a week. 6 mL 1   amoxicillin-clavulanate (AUGMENTIN) 875-125 MG tablet Take 1 tablet by mouth every 12 (twelve) hours. (Patient not taking: Reported on 05/27/2022) 14 tablet 0   ciclopirox (LOPROX) 0.77 % cream Apply topically 2 (two) times daily. (Patient not taking: Reported on 05/27/2022) 15 g 0   ciclopirox (PENLAC) 8 % solution Apply topically at bedtime. Apply over nail and surrounding skin. Apply daily over previous coat. After seven (7) days, may remove with alcohol and continue cycle. (Patient not taking: Reported on 05/27/2022) 6.6 mL 0   ciclopirox (PENLAC) 8 % solution Apply over nail and surrounding skin at bedtime. Apply daily over previous coat. After seven (7) days, may remove with alcohol and continue cycle. (Patient not taking: Reported on 05/27/2022) 6.6 mL 0   metFORMIN (GLUCOPHAGE) 500 MG tablet Take 1 tablet (500 mg total) by mouth 2 (two) times daily with a meal. (Patient not taking: Reported on 05/27/2022) 180 tablet 1   oxyCODONE (ROXICODONE) 5 MG immediate release tablet Take 1 tablet (5 mg total) by mouth every 4 (four) hours as needed for severe pain. (Patient not taking: Reported on 05/27/2022) 30 tablet 0   No facility-administered medications prior to visit.    Allergies  Allergen Reactions   Tylenol [Acetaminophen] Other  (See Comments)    ELEVATED LFTs       Objective:    BP (!) 141/71   Pulse 77   Ht 5\' 10"  (1.778 m)   Wt 211 lb (95.7 kg)   SpO2 95%   BMI 30.28 kg/m  Wt Readings from Last 3 Encounters:  05/27/22 211 lb (95.7 kg)  03/30/22 208 lb (94.3 kg)  02/26/22 217 lb 6.4 oz (98.6 kg)    Physical Exam Vitals and nursing note reviewed.  Constitutional:      Appearance: He is well-developed.  HENT:     Head: Normocephalic and atraumatic.  Cardiovascular:     Rate and Rhythm: Normal rate and regular rhythm.     Heart sounds: Normal heart sounds. No murmur heard.    No friction rub. No gallop.  Pulmonary:     Effort: Pulmonary effort is normal. No tachypnea or respiratory distress.     Breath sounds: Normal breath sounds. No decreased breath sounds, wheezing, rhonchi or rales.  Chest:     Chest wall: No tenderness.  Abdominal:     General: Bowel sounds are normal. There is distension.     Palpations: Abdomen is soft.  Musculoskeletal:        General: Normal range of motion.     Cervical back: Normal range of motion.       Feet:  Skin:    General: Skin is warm and dry.  Neurological:     Mental Status: He is alert and oriented to person, place, and time.     Coordination: Coordination normal.  Psychiatric:        Behavior: Behavior normal. Behavior is cooperative.        Thought Content: Thought content normal.        Judgment: Judgment normal.          Patient has been counseled extensively about nutrition and exercise as well as the importance of adherence with medications and regular follow-up. The patient was given clear instructions to go to ER or return to medical center if symptoms don't improve, worsen or new problems develop. The patient  verbalized understanding.   Follow-up: Return in about 3 months (around 08/27/2022).   Claiborne Rigg, FNP-BC [facility] [place], [place] [phone]   05/27/2022, 1:34 PM

## 2022-05-30 ENCOUNTER — Ambulatory Visit (INDEPENDENT_AMBULATORY_CARE_PROVIDER_SITE_OTHER): Payer: Medicare Other | Admitting: Podiatry

## 2022-05-30 DIAGNOSIS — Z794 Long term (current) use of insulin: Secondary | ICD-10-CM | POA: Diagnosis not present

## 2022-05-30 DIAGNOSIS — E118 Type 2 diabetes mellitus with unspecified complications: Secondary | ICD-10-CM

## 2022-05-30 DIAGNOSIS — D2121 Benign neoplasm of connective and other soft tissue of right lower limb, including hip: Secondary | ICD-10-CM

## 2022-05-30 DIAGNOSIS — Z9889 Other specified postprocedural states: Secondary | ICD-10-CM

## 2022-05-30 MED ORDER — KETOCONAZOLE 2 % EX CREA: 1.0000 | TOPICAL_CREAM | Freq: Every day | CUTANEOUS | 0 refills | Status: DC

## 2022-05-30 MED ORDER — PREGABALIN 75 MG PO CAPS: 75.0000 mg | ORAL_CAPSULE | Freq: Two times a day (BID) | ORAL | 2 refills | Status: DC

## 2022-05-30 MED ORDER — CICLOPIROX 8 % EX SOLN: Freq: Every day | CUTANEOUS | 0 refills | Status: DC

## 2022-05-30 NOTE — Progress Notes (Signed)
  Subjective:  Patient ID: Phillip Paul, male    DOB: 1950/06/04,  MRN: [id number]  Chief Complaint  Patient presents with   Routine Post Op    5th postop visit right foot fibroma excision.    DOS: 01/16/2022 Procedure: Excision of plantar fibroma right foot  72 y.o. male returns for post-op check.  Patient here for postoperative check after surgery on the right foot approximately 14 Weeks ago.  Surgery involved excision of plantar fibroma in the right foot.  He reports he is doing better still having some pain at the surgical site due to scar tissue there however the wound is fully healed and there is no recurrence of the mass.  He is noticing some burning tingling pains in both feet both in the top of the foot as well as the ankle area.  Does have a history of neuropathy.  Also has a history of type 2 diabetes with insulin use.  Review of Systems: Negative except as noted in the HPI. Denies N/V/F/Ch.   Objective:   There were no vitals filed for this visit.  There is no height or weight on file to calculate BMI. Constitutional Well developed. Well nourished.  Vascular Foot warm and well perfused. Capillary refill normal to all digits.  Calf is soft and supple, no posterior calf or knee pain, negative Homans' sign  Neurologic Normal speech. Oriented to person, place, and time. Epicritic sensation to light touch grossly present bilaterally.  Dermatologic Incision well healed.  No dehiscence it is now well-healed.  There is mild scar tissue at the incision however decreased from prior.  Minimal to no tenderness with palpation at the surgical site and there is no firm mass present no indication of recurrence of the fibroma.  Orthopedic: Decreased tenderness to palpation noted about the surgical site.   Multiple view plain film radiographs: Deferred at this visit as the patient had soft tissue procedure only. Assessment:   1. Post-operative state   2. Fibroma of right foot    3. Controlled type 2 diabetes mellitus with complication, with long-term current use of insulin (HCC)        Plan:  Patient was evaluated and treated and all questions answered.  S/p foot surgery right foot plantar fibroma excision -Fully healed postoperatively in regards to the plantar fibroma surgery site on the right foot -I recommend continued massage with vitamin E lotion to breakdown scar tissue present at the surgical site -Patient states his pain is continued to decrease over time and I believe will continue to do so  # Neuropathic pain in the bilateral foot -E Rx for Lyrica 75 mg take twice daily as needed for neuropathic pain sent to the patient's pharmacy. -Discussed the risk benefits and side effects associate with Lyrica.  He should discontinue gabapentin when he begins taking this  # Onychomycosis -E Rx for ketoconazole 2% cream as well as Penlac 8% topical lotion sent to the patient's pharmacy use as needed  Return in about 3 months (around 08/30/2022).         Corinna Gab, DPM [facility]

## 2022-05-31 ENCOUNTER — Encounter: Payer: Self-pay | Admitting: Nurse Practitioner

## 2022-06-01 ENCOUNTER — Other Ambulatory Visit: Payer: Self-pay | Admitting: Nurse Practitioner

## 2022-06-01 DIAGNOSIS — Z794 Long term (current) use of insulin: Secondary | ICD-10-CM

## 2022-06-01 DIAGNOSIS — E118 Type 2 diabetes mellitus with unspecified complications: Secondary | ICD-10-CM

## 2022-06-03 NOTE — Telephone Encounter (Signed)
Requested Prescriptions  Pending Prescriptions Disp Refills   dapagliflozin propanediol (FARXIGA) 10 MG TABS tablet [Pharmacy Med Name: Dapagliflozin Propanediol 10 MG Oral Tablet] 90 tablet 0    Sig: Take 1 tablet by mouth once daily     Endocrinology:  Diabetes - SGLT2 Inhibitors Failed - 06/01/2022  6:54 AM      Failed - Cr in normal range and within 360 days    Creat  Date Value Ref Range Status  08/09/2019 0.77 0.70 - 1.25 mg/dL Final    Comment:    For patients >30 years of age, the reference limit for Creatinine is approximately 13% higher for people identified as African-American. .    Creatinine, Ser  Date Value Ref Range Status  03/30/2022 0.50 (L) 0.61 - 1.24 mg/dL Final         Passed - HBA1C is between 0 and 7.9 and within 180 days    HbA1c, POC (controlled diabetic range)  Date Value Ref Range Status  05/27/2022 6.6 0.0 - 7.0 % Final         Passed - eGFR in normal range and within 360 days    GFR, Est African American  Date Value Ref Range Status  06/22/2013 >89 mL/min Final   GFR calc Af Amer  Date Value Ref Range Status  02/23/2020 109 >59 mL/min/1.73 Final    Comment:    **In accordance with recommendations from the NKF-ASN Task force,**   [facility] is in the process of updating its eGFR calculation to the   2021 CKD-EPI creatinine equation that estimates kidney function   without a race variable.    GFR, Est Non African American  Date Value Ref Range Status  06/22/2013 >89 mL/min Final    Comment:      The estimated GFR is a calculation valid for adults (>=37 years old) that uses the CKD-EPI algorithm to adjust for age and sex. It is   not to be used for children, pregnant women, hospitalized patients,    patients on dialysis, or with rapidly changing kidney function. According to the NKDEP, eGFR >89 is normal, 60-89 shows mild impairment, 30-59 shows moderate impairment, 15-29 shows severe impairment and <15 is ESRD.     GFR, Estimated  Date  Value Ref Range Status  03/30/2022 >60 >60 mL/min Final    Comment:    (NOTE) Calculated using the CKD-EPI Creatinine Equation (2021)    GFR  Date Value Ref Range Status  03/28/2017 115.83 >60.00 mL/min Final   eGFR  Date Value Ref Range Status  02/26/2022 100 >59 mL/min/1.73 Final         Passed - Valid encounter within last 6 months    Recent Outpatient Visits           1 week ago Controlled type 2 diabetes mellitus with complication, with long-term current use of insulin [facility])   [place] [place], [place] W, NP   3 months ago Controlled type 2 diabetes mellitus with complication, with long-term current use of insulin [facility])   [place] [facility] [place], [place] W, NP   3 months ago Controlled type 2 diabetes mellitus with complication, with long-term current use of insulin [facility])   [place] [facility] [place], [place] L, RPH-CPP   5 months ago Controlled type 2 diabetes mellitus with complication, with long-term current use of insulin [facility])    [facility] [place]  Allyson Sabal, RPH-CPP   6 months ago Controlled type 2 diabetes mellitus with complication, with long-term current use of insulin [facility])   [place] [facility] Claiborne Rigg, NP       Future Appointments             In 2 months Claiborne Rigg, NP [facility]

## 2022-06-16 ENCOUNTER — Encounter: Payer: Self-pay | Admitting: Nurse Practitioner

## 2022-06-17 ENCOUNTER — Ambulatory Visit: Payer: Medicare Other | Attending: Family Medicine

## 2022-06-17 ENCOUNTER — Other Ambulatory Visit: Payer: Self-pay | Admitting: Nurse Practitioner

## 2022-06-17 DIAGNOSIS — R109 Unspecified abdominal pain: Secondary | ICD-10-CM

## 2022-06-18 ENCOUNTER — Telehealth: Payer: Self-pay

## 2022-06-18 NOTE — Telephone Encounter (Unsigned)
Copied from CRM [id number]. Topic: Appointment Scheduling - Scheduling Inquiry for Clinic >> Jun 18, 2022  2:46 PM Phillip Paul wrote: Please cancel my appointment at [facility] and schedule a sooner appointment at [facility] Thank you

## 2022-06-18 NOTE — Progress Notes (Signed)
Patient identified by name and date of birth.  Aware of appt scheduled.

## 2022-06-20 ENCOUNTER — Other Ambulatory Visit: Payer: Self-pay | Admitting: Nurse Practitioner

## 2022-06-20 DIAGNOSIS — F419 Anxiety disorder, unspecified: Secondary | ICD-10-CM

## 2022-06-20 NOTE — Telephone Encounter (Signed)
Requested Prescriptions  Pending Prescriptions Disp Refills   busPIRone (BUSPAR) 15 MG tablet [Pharmacy Med Name: busPIRone HCl 15 MG Oral Tablet] 180 tablet 0    Sig: TAKE 1 TABLET BY MOUTH TWICE DAILY FOR ANXIETY     Psychiatry: Anxiolytics/Hypnotics - Non-controlled Passed - 06/20/2022  6:51 AM      Passed - Valid encounter within last 12 months    Recent Outpatient Visits           3 weeks ago Controlled type 2 diabetes mellitus with complication, with long-term current use of insulin [facility])   [place] [facility] [place], [place] W, NP   3 months ago Controlled type 2 diabetes mellitus with complication, with long-term current use of insulin [facility])   [place] [facility] [place], [place] W, NP   4 months ago Controlled type 2 diabetes mellitus with complication, with long-term current use of insulin [facility])   [place] [facility] [place], [place] L, RPH-CPP   5 months ago Controlled type 2 diabetes mellitus with complication, with long-term current use of insulin [facility])   [place] [place], [place] L, RPH-CPP   6 months ago Controlled type 2 diabetes mellitus with complication, with long-term current use of insulin [facility])   [place] [facility] [place], Shea Stakes, NP       Future Appointments             In 2 months Claiborne Rigg, NP [facility]

## 2022-06-21 ENCOUNTER — Other Ambulatory Visit: Payer: Self-pay | Admitting: Cardiology

## 2022-06-24 ENCOUNTER — Ambulatory Visit
Admission: RE | Admit: 2022-06-24 | Discharge: 2022-06-24 | Disposition: A | Discharge status: Home / Self Care | Payer: Medicare Other | Source: Ambulatory Visit | Attending: Nurse Practitioner | Admitting: Nurse Practitioner

## 2022-06-24 ENCOUNTER — Other Ambulatory Visit: Payer: Self-pay | Admitting: Podiatry

## 2022-06-24 ENCOUNTER — Other Ambulatory Visit: Payer: Self-pay | Admitting: Nurse Practitioner

## 2022-06-24 ENCOUNTER — Other Ambulatory Visit: Payer: Self-pay | Admitting: Family Medicine

## 2022-06-24 DIAGNOSIS — R42 Dizziness and giddiness: Secondary | ICD-10-CM

## 2022-06-24 DIAGNOSIS — R932 Abnormal findings on diagnostic imaging of liver and biliary tract: Secondary | ICD-10-CM | POA: Diagnosis not present

## 2022-06-24 DIAGNOSIS — R7989 Other specified abnormal findings of blood chemistry: Secondary | ICD-10-CM

## 2022-06-24 DIAGNOSIS — R1901 Right upper quadrant abdominal swelling, mass and lump: Secondary | ICD-10-CM

## 2022-06-30 ENCOUNTER — Other Ambulatory Visit: Payer: Self-pay | Admitting: Nurse Practitioner

## 2022-06-30 DIAGNOSIS — K219 Gastro-esophageal reflux disease without esophagitis: Secondary | ICD-10-CM

## 2022-07-01 NOTE — Telephone Encounter (Signed)
Requested Prescriptions  Pending Prescriptions Disp Refills   omeprazole (PRILOSEC) 40 MG capsule [Pharmacy Med Name: Omeprazole 40 MG Oral Capsule Delayed Release] 30 capsule 0    Sig: Take 1 capsule by mouth once daily     Gastroenterology: Proton Pump Inhibitors Passed - 06/30/2022  7:52 AM      Passed - Valid encounter within last 12 months    Recent Outpatient Visits           1 month ago Controlled type 2 diabetes mellitus with complication, with long-term current use of insulin [facility])   [place] [place], [place] W, NP   4 months ago Controlled type 2 diabetes mellitus with complication, with long-term current use of insulin [facility])   [place] [facility] [place], [place] W, NP   4 months ago Controlled type 2 diabetes mellitus with complication, with long-term current use of insulin [facility])   [place] [facility] [place], [place] L, RPH-CPP   6 months ago Controlled type 2 diabetes mellitus with complication, with long-term current use of insulin [facility])   [place] [facility] [place], [place] L, RPH-CPP   7 months ago Controlled type 2 diabetes mellitus with complication, with long-term current use of insulin [facility])   [place] [facility] [place], Shea Stakes, NP       Future Appointments             In 1 month Claiborne Rigg, NP [facility]

## 2022-07-09 ENCOUNTER — Ambulatory Visit: Payer: Self-pay | Admitting: Surgery

## 2022-07-09 ENCOUNTER — Telehealth: Payer: Self-pay | Admitting: *Deleted

## 2022-07-09 DIAGNOSIS — K828 Other specified diseases of gallbladder: Secondary | ICD-10-CM | POA: Diagnosis not present

## 2022-07-09 DIAGNOSIS — R1011 Right upper quadrant pain: Secondary | ICD-10-CM | POA: Diagnosis not present

## 2022-07-09 NOTE — Telephone Encounter (Signed)
   Pre-operative Risk Assessment    Patient Name: Phillip Paul  DOB: [birth year] MRN: [id number]      Request for Surgical Clearance    Procedure:   LAPAROSCOPIC CHOLECYSTECTOMY  Date of Surgery:  Clearance TBD                                 Surgeon:  Sophronia Simas, MD Surgeon's Group or Practice Name:  [facility] Phone number:  [phone] Fax number:  [phone]   Type of Clearance Requested:   - Medical  - Pharmacy:  Hold Aspirin NOT INDICATED HOW LONG   Type of Anesthesia:  General    Additional requests/questions:    [name]   07/09/2022, 10:18 AM

## 2022-07-09 NOTE — H&P (Signed)
History of Present Illness: Phillip Paul is a 72 y.o. male who is seen today for follow up of a gallbladder polyp. 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 Continued surveillance with a follow up US in 6 months was recommended, however he was lost to further follow up. He more recently had a RUQ [place] on 3/12, which showed a new 3.69m lesion in the gallbladder, interpreted as a polyp vs adherent sludge. He was seen in the ED on 3/16 with generalized abdominal pain, at which time a CT showed a small umbilical hernia but no acute findings related to the gallbladder and no visible masses. He had a follow up US on 6/10, which showed a persistent 3cm mass lesion in the gallbladder. He was referred to discuss surgery.   Today he says he has having frequent abdominal pain, which is worsening. He says it migrates around the right side of his abdomen and flank, and up to the right chest. It happens every day, often at night. It does not seem related to eating.   He saw a cardiologist a year ago for evaluation of chest pain.  He had a coronary CTA, which showed moderate CAD with LAD stenosis. His statin dose was increased.   He has not had any prior abdominal surgeries.         Review of Systems: A complete review of systems was obtained from the patient.  I have reviewed this information and discussed as appropriate with the patient.  See HPI as well for other ROS.     Medical History: Past Medical History Past Medical History: Diagnosis Date  Diabetes mellitus without complication (CMS/HHS-HCC)    GERD (gastroesophageal reflux disease)    Hyperlipidemia    Hypertension        Problem List Patient Active Problem List Diagnosis  Pyogenic granuloma of skin and subcutaneous tissue  Prolapsed internal hemorrhoids, grade 2      Past Surgical History History  reviewed. No pertinent surgical history.     Allergies Allergies Allergen Reactions  Acetaminophen Other (See Comments)     ELEVATED LFTs      Medications Ordered Prior to Encounter Current Outpatient Medications on File Prior to Visit Medication Sig Dispense Refill  amLODIPine (NORVASC) 10 MG tablet Take 10 mg by mouth once daily      blood glucose diagnostic (ONETOUCH ULTRA TEST) test strip Use as instructed. Check blood glucose level by fingerstick twice per day.  E11.65      LEVEMIR FLEXTOUCH U-100 INSULN pen injector (concentration 100 units/mL) INJECT 5 UNITS SUBCUTANEOUSLY AT BEDTIME      losartan (COZAAR) 100 MG tablet Take 100 mg by mouth once daily      metFORMIN (GLUCOPHAGE) 500 MG tablet Take 500 mg by mouth once daily (Patient not taking: Reported on 07/09/2022)      oxyCODONE (ROXICODONE) 5 MG immediate release tablet Take 1-2 tablets (5-10 mg total) by mouth every 6 (six) hours as needed for Pain (Patient not taking: Reported on 07/09/2022) 30 tablet 0    No current facility-administered medications on file prior to visit.      Family History Family History Family history unknown: Yes      Tobacco Use History Social History    Tobacco Use Smoking Status Every Day  Types: Cigarettes Smokeless Tobacco Current      Social History  Social History    Socioeconomic History  Marital status: Radiation protection practitioner Tobacco Use  Smoking status: Every Day     Types: Cigarettes  Smokeless tobacco: Current Substance and Sexual Activity  Alcohol use: Never  Drug use: Never Social History Narrative   Family originally from [place].      Objective:     Vitals:   07/09/22 0942 BP: 136/80 Pulse: 96 Temp: 36.9 C (98.5 F) SpO2: 95% Weight: 97.9 kg (215 lb 12.8 oz)   Body mass index is 30.1 kg/m.   Physical Exam Vitals reviewed.  Constitutional:      General: He is not in acute distress.    Appearance: Normal appearance.  HENT:     Head: Normocephalic  and atraumatic.  Eyes:     General: No scleral icterus.    Conjunctiva/sclera: Conjunctivae normal.  Cardiovascular:     Rate and Rhythm: Normal rate and regular rhythm.  Pulmonary:     Effort: Pulmonary effort is normal. No respiratory distress.     Breath sounds: Normal breath sounds. No wheezing.  Abdominal:     General: There is no distension.     Palpations: Abdomen is soft.     Tenderness: There is no abdominal tenderness.     Comments: No surgical scars.  Musculoskeletal:        General: Normal range of motion.  Skin:    General: Skin is warm and dry.     Coloration: Skin is not jaundiced.  Neurological:     General: No focal deficit present.     Mental Status: He is alert and oriented to person, place, and time.              Labs, Imaging and Diagnostic Testing: RUQ [place] 06/24/22: IMPRESSION: 1. The 2.6 x 3.0 cm masslike region dependently near the fundus in the gallbladder persists. The finding measure 3.5 cm previously. The finding was not identified prior to the March 26, 2022 study but there was a polyp in this general location on prior studies. The masslike region may still represent tumefactive debris. However, a growing polyp or mass is not excluded. Recommend surgical consultation. An MRI of the abdomen with contrast and MRCP sequences could better evaluate this region. 2. Diffuse increased echogenicity throughout the liver is nonspecific and may be seen with hepatic steatosis or other underlying intrinsic liver disease.       Assessment and Plan:   Assessment Diagnoses and all orders for this visit:   Gallbladder mass       Mr. Kenyon is a 72 yo male presenting for follow up of a gallbladder mass. It is unclear from his imaging if this represents rapid and significant enlargement of his previous polyp, or dependent sludge. He is having some RUQ which is consistent with biliary colic, although I think some of his symptoms are multifactorial. I  discussed an MRI to further evaluate the mass lesion in the gallbladder, vs proceeding with cholecystectomy to both obtain a diagnosis and hopefully relieve his symptoms. Given worsening abdominal pain and gallbladder abnormalities on imaging, I would favor proceeding with surgery and the patient is agreeable to this. He will need cardiac risk stratification prior to surgery given his known CAD, which is currently being medically managed. Once clearance is obtained, he will be scheduled for surgery. I reviewed the details of laparoscopic cholecystectomy with the patient, including the benefits and risks. Will also plan to primarily repair his umbilical hernia at the time of surgery. He expressed  understanding and agrees to proceed with surgery.  Sophronia Simas, MD [facility], Hepatobiliary and Pancreatic Surgery 07/09/22 10:21 AM

## 2022-07-09 NOTE — Telephone Encounter (Signed)
I have attempted to contact this patient by phone by patient VM mail has not be set up to leave a message. I will continue to try later

## 2022-07-09 NOTE — Telephone Encounter (Signed)
    Primary Cardiologist:Mark Anne Fu, MD  Chart reviewed as part of pre-operative protocol coverage. Because of Phillip Paul's past medical history and time since last visit, he/she will require a follow-up visit in order to better assess preoperative cardiovascular risk.  Pre-op covering staff: - Please schedule Office appointment and call patient to inform them. - Please contact requesting surgeon's office via preferred method (i.e, phone, fax) to inform them of need for appointment prior to surgery.  If applicable, this message will also be routed to pharmacy pool and/or primary cardiologist for input on holding anticoagulant/antiplatelet agent as requested below so that this information is available at time of patient's appointment.   Ronney Asters, NP  07/09/2022, 10:58 AM

## 2022-07-12 NOTE — Telephone Encounter (Signed)
Follow up appointment has been scheduled. Patient agreeable and voiced understanding.

## 2022-07-16 ENCOUNTER — Other Ambulatory Visit: Payer: Self-pay | Admitting: Cardiology

## 2022-07-16 ENCOUNTER — Other Ambulatory Visit: Payer: Medicare Other

## 2022-07-28 ENCOUNTER — Other Ambulatory Visit: Payer: Self-pay | Admitting: Family Medicine

## 2022-07-28 ENCOUNTER — Other Ambulatory Visit: Payer: Self-pay | Admitting: Nurse Practitioner

## 2022-07-28 DIAGNOSIS — K219 Gastro-esophageal reflux disease without esophagitis: Secondary | ICD-10-CM

## 2022-07-29 NOTE — Telephone Encounter (Signed)
Requested Prescriptions  Refused Prescriptions Disp Refills   TRULICITY 1.5 MG/0.5ML SOPN [Pharmacy Med Name: Trulicity 1.5 MG/0.5ML Subcutaneous Solution Pen-injector] 12 mL 0    Sig: INJECT 1.5 MG SUBCUTANEOUSLY ONCE A WEEK     Endocrinology:  Diabetes - GLP-1 Receptor Agonists Passed - 07/28/2022  7:53 AM      Passed - HBA1C is between 0 and 7.9 and within 180 days    HbA1c, POC (controlled diabetic range)  Date Value Ref Range Status  05/27/2022 6.6 0.0 - 7.0 % Final         Passed - Valid encounter within last 6 months    Recent Outpatient Visits           2 months ago Controlled type 2 diabetes mellitus with complication, with long-term current use of insulin [facility])   [place] [facility] [place], [place] W, NP   5 months ago Controlled type 2 diabetes mellitus with complication, with long-term current use of insulin [facility])   [place] [facility] [place], [place] W, NP   5 months ago Controlled type 2 diabetes mellitus with complication, with long-term current use of insulin [facility])   [place] [facility] [place], [place] L, RPH-CPP   6 months ago Controlled type 2 diabetes mellitus with complication, with long-term current use of insulin [facility])   [place] [facility] [place], [place] L, RPH-CPP   8 months ago Controlled type 2 diabetes mellitus with complication, with long-term current use of insulin [facility])   [place] [facility] [place], Shea Stakes, NP       Future Appointments             In 3 weeks Alben Spittle, Evern Bio, PA-C [place] [facility], Administrator   In 4 weeks Claiborne Rigg, NP [facility]

## 2022-07-29 NOTE — Telephone Encounter (Signed)
Requested Prescriptions  Pending Prescriptions Disp Refills   omeprazole (PRILOSEC) 40 MG capsule [Pharmacy Med Name: Omeprazole 40 MG Oral Capsule Delayed Release] 90 capsule 3    Sig: Take 1 capsule by mouth once daily     Gastroenterology: Proton Pump Inhibitors Passed - 07/28/2022  7:53 AM      Passed - Valid encounter within last 12 months    Recent Outpatient Visits           2 months ago Controlled type 2 diabetes mellitus with complication, with long-term current use of insulin [facility])   [place] [place], [place] W, NP   5 months ago Controlled type 2 diabetes mellitus with complication, with long-term current use of insulin [facility])   [place] [facility] [place], [place] W, NP   5 months ago Controlled type 2 diabetes mellitus with complication, with long-term current use of insulin [facility])   [place] [facility] [place], [place] L, RPH-CPP   6 months ago Controlled type 2 diabetes mellitus with complication, with long-term current use of insulin [facility])   [place] [facility] [place], [place] L, RPH-CPP   8 months ago Controlled type 2 diabetes mellitus with complication, with long-term current use of insulin [facility])    [place] [facility] [place], Shea Stakes, NP       Future Appointments             In 3 weeks Alben Spittle, Evern Bio, PA-C  [facility], Administrator   In 4 weeks Claiborne Rigg, NP [facility]

## 2022-08-01 ENCOUNTER — Encounter: Payer: Self-pay | Admitting: Nurse Practitioner

## 2022-08-02 ENCOUNTER — Encounter: Payer: Self-pay | Admitting: Podiatry

## 2022-08-06 ENCOUNTER — Other Ambulatory Visit: Payer: Self-pay | Admitting: Podiatry

## 2022-08-06 MED ORDER — PREGABALIN 100 MG PO CAPS: 100.0000 mg | ORAL_CAPSULE | Freq: Two times a day (BID) | ORAL | 2 refills | Status: DC

## 2022-08-13 ENCOUNTER — Other Ambulatory Visit: Payer: Self-pay | Admitting: Family Medicine

## 2022-08-13 DIAGNOSIS — I10 Essential (primary) hypertension: Secondary | ICD-10-CM

## 2022-08-13 NOTE — Telephone Encounter (Signed)
Requested Prescriptions  Pending Prescriptions Disp Refills   amLODipine (NORVASC) 10 MG tablet [Pharmacy Med Name: amLODIPine Besylate 10 MG Oral Tablet] 90 tablet 0    Sig: Take 1 tablet by mouth once daily     Cardiovascular: Calcium Channel Blockers 2 Failed - 08/13/2022  6:53 AM      Failed - Last BP in normal range    BP Readings from Last 1 Encounters:  05/27/22 (!) 141/71         Passed - Last Heart Rate in normal range    Pulse Readings from Last 1 Encounters:  05/27/22 77         Passed - Valid encounter within last 6 months    Recent Outpatient Visits           2 months ago Controlled type 2 diabetes mellitus with complication, with long-term current use of insulin (HCC)   [place] [facility] [place], [place] W, NP   5 months ago Controlled type 2 diabetes mellitus with complication, with long-term current use of insulin [facility])   [place] [facility] [place], [place] W, NP   6 months ago Controlled type 2 diabetes mellitus with complication, with long-term current use of insulin [facility])   [place] [facility] [place], [place] L, RPH-CPP   7 months ago Controlled type 2 diabetes mellitus with complication, with long-term current use of insulin [facility])   [place] [facility] [place], [place] L, RPH-CPP   8 months ago Controlled type 2 diabetes mellitus with complication, with long-term current use of insulin [facility])   [place] [facility] [place], Shea Stakes, NP       Future Appointments             In 1 week Alben Spittle, Evern Bio, PA-C [place] [facility], [facility]   In 2 weeks Claiborne Rigg, NP [facility]

## 2022-08-14 ENCOUNTER — Other Ambulatory Visit: Payer: Self-pay | Admitting: Nurse Practitioner

## 2022-08-14 DIAGNOSIS — E118 Type 2 diabetes mellitus with unspecified complications: Secondary | ICD-10-CM

## 2022-08-16 ENCOUNTER — Other Ambulatory Visit: Payer: Self-pay | Admitting: Cardiology

## 2022-08-18 ENCOUNTER — Encounter: Payer: Self-pay | Admitting: Physician Assistant

## 2022-08-18 DIAGNOSIS — I2510 Atherosclerotic heart disease of native coronary artery without angina pectoris: Secondary | ICD-10-CM | POA: Insufficient documentation

## 2022-08-18 DIAGNOSIS — Z01810 Encounter for preprocedural cardiovascular examination: Secondary | ICD-10-CM | POA: Insufficient documentation

## 2022-08-18 HISTORY — DX: Atherosclerotic heart disease of native coronary artery without angina pectoris: I25.10
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
## 2022-08-20 ENCOUNTER — Other Ambulatory Visit: Payer: Self-pay | Admitting: Physician Assistant

## 2022-08-20 ENCOUNTER — Encounter: Payer: Self-pay | Admitting: Physician Assistant

## 2022-08-20 ENCOUNTER — Ambulatory Visit (HOSPITAL_COMMUNITY)
Admission: RE | Admit: 2022-08-20 | Discharge: 2022-08-20 | Disposition: A | Discharge status: Home / Self Care | Payer: Medicare Other | Source: Ambulatory Visit | Attending: Internal Medicine | Admitting: Internal Medicine

## 2022-08-20 ENCOUNTER — Ambulatory Visit (INDEPENDENT_AMBULATORY_CARE_PROVIDER_SITE_OTHER): Payer: Medicare Other | Admitting: Physician Assistant

## 2022-08-20 VITALS — BP 121/60 | HR 78 | Ht 71.0 in | Wt 212.4 lb

## 2022-08-20 DIAGNOSIS — Z01810 Encounter for preprocedural cardiovascular examination: Secondary | ICD-10-CM

## 2022-08-20 DIAGNOSIS — R600 Localized edema: Secondary | ICD-10-CM | POA: Diagnosis not present

## 2022-08-20 DIAGNOSIS — E782 Mixed hyperlipidemia: Secondary | ICD-10-CM

## 2022-08-20 DIAGNOSIS — I77810 Thoracic aortic ectasia: Secondary | ICD-10-CM

## 2022-08-20 DIAGNOSIS — I10 Essential (primary) hypertension: Secondary | ICD-10-CM | POA: Diagnosis not present

## 2022-08-20 DIAGNOSIS — I2510 Atherosclerotic heart disease of native coronary artery without angina pectoris: Secondary | ICD-10-CM | POA: Diagnosis not present

## 2022-08-20 DIAGNOSIS — R0602 Shortness of breath: Secondary | ICD-10-CM

## 2022-08-20 DIAGNOSIS — R0609 Other forms of dyspnea: Secondary | ICD-10-CM | POA: Diagnosis not present

## 2022-08-20 HISTORY — DX: Thoracic aortic ectasia: I77.810

## 2022-08-20 NOTE — Assessment & Plan Note (Signed)
Obtain follow up echocardiogram to recheck size.
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
## 2022-08-20 NOTE — Assessment & Plan Note (Signed)
Moderate nonobstructive CAD with high calcium burden. He has had fairly chronic left lower chest pain that seems to be related to stress. No recent changes in chest pain.  -Continue aspirin 81mg  and rosuvastatin 20mg  daily. -Consider increasing rosuvastatin if LDL remains above 55 on future labs.

## 2022-08-20 NOTE — Patient Instructions (Signed)
Medication Instructions:  Your physician recommends that you continue on your current medications as directed. Please refer to the Current Medication list given to you today.  *If you need a refill on your cardiac medications before your next appointment, please call your pharmacy*  Testing/Procedures: Your physician has requested that you have an echocardiogram. Echocardiography is a painless test that uses sound waves to create images of your heart. It provides your doctor with information about the size and shape of your heart and how well your heart's chambers and valves are working. This procedure takes approximately one hour. There are no restrictions for this procedure. Please do NOT wear cologne, perfume, aftershave, or lotions (deodorant is allowed). Please arrive 15 minutes prior to your appointment time.  Your physician has requested that you have a lower extremity venous duplex. This test is an ultrasound of the veins in the legs. It looks at venous blood flow that carries blood from the heart to the legs. Allow one hour for a Lower Venous exam. There are no restrictions or special instructions.  Follow-Up: At [facility], you and your health needs are our priority.  As part of our continuing mission to provide you with exceptional heart care, we have created designated Provider Care Teams.  These Care Teams include your primary Cardiologist (physician) and Advanced Practice Providers (APPs -  Physician Assistants and Nurse Practitioners) who all work together to provide you with the care you need, when you need it.  Your next appointment:   6 month(s)  Provider:   Donato Schultz, MD

## 2022-08-20 NOTE — Assessment & Plan Note (Addendum)
Phillip Paul's perioperative risk of a major cardiac event is 11% according to the Revised Cardiac Risk Index (RCRI).  Therefore, he is at high risk for perioperative complications.  However, his functional capacity is good at 4.74 METs according to the [facility] Activity Status Index (DASI). He does have some intermittent shortness of breath as well as a systolic murmur on exam.  Recommendations: The patient requires an echocardiogram before a disposition can be made regarding surgical risk.                Antiplatelet and/or Anticoagulation Recommendations: Aspirin can be held for 7 days prior to his surgery.  Please resume Aspirin post operatively when it is felt to be safe from a bleeding standpoint.

## 2022-08-20 NOTE — Assessment & Plan Note (Signed)
Symptoms of shortness of breath are intermittent. He was given an inhaler by primary care but has not noticed any change with this. He has a murmur on exam as well and needs surgical clearance. -Obtain echocardiogram

## 2022-08-20 NOTE — Assessment & Plan Note (Signed)
LDL was 61 in November 2023. Currently on rosuvastatin 20mg  daily. -Continue rosuvastatin 20mg  daily. -Consider increasing dose if LDL remains above 55 on future labs.

## 2022-08-20 NOTE — Assessment & Plan Note (Signed)
Controlled. -Continue amlodipine 10mg  and losartan 100mg  daily.

## 2022-08-27 ENCOUNTER — Ambulatory Visit: Payer: Medicare Other | Attending: Nurse Practitioner | Admitting: Nurse Practitioner

## 2022-08-27 ENCOUNTER — Other Ambulatory Visit: Payer: Self-pay | Admitting: Nurse Practitioner

## 2022-08-27 ENCOUNTER — Ambulatory Visit
Admission: RE | Admit: 2022-08-27 | Discharge: 2022-08-27 | Disposition: A | Discharge status: Home / Self Care | Payer: Medicare Other | Source: Ambulatory Visit | Attending: Nurse Practitioner | Admitting: Nurse Practitioner

## 2022-08-27 ENCOUNTER — Encounter: Payer: Self-pay | Admitting: Nurse Practitioner

## 2022-08-27 VITALS — BP 147/82 | HR 87 | Ht 71.0 in | Wt 211.6 lb

## 2022-08-27 DIAGNOSIS — R109 Unspecified abdominal pain: Secondary | ICD-10-CM

## 2022-08-27 DIAGNOSIS — F419 Anxiety disorder, unspecified: Secondary | ICD-10-CM

## 2022-08-27 DIAGNOSIS — M7989 Other specified soft tissue disorders: Secondary | ICD-10-CM

## 2022-08-27 DIAGNOSIS — Z794 Long term (current) use of insulin: Secondary | ICD-10-CM | POA: Diagnosis not present

## 2022-08-27 DIAGNOSIS — I10 Essential (primary) hypertension: Secondary | ICD-10-CM | POA: Diagnosis not present

## 2022-08-27 DIAGNOSIS — K824 Cholesterolosis of gallbladder: Secondary | ICD-10-CM

## 2022-08-27 DIAGNOSIS — J984 Other disorders of lung: Secondary | ICD-10-CM | POA: Diagnosis not present

## 2022-08-27 DIAGNOSIS — K219 Gastro-esophageal reflux disease without esophagitis: Secondary | ICD-10-CM | POA: Diagnosis not present

## 2022-08-27 DIAGNOSIS — N069 Isolated proteinuria with unspecified morphologic lesion: Secondary | ICD-10-CM | POA: Diagnosis not present

## 2022-08-27 DIAGNOSIS — E118 Type 2 diabetes mellitus with unspecified complications: Secondary | ICD-10-CM

## 2022-08-27 DIAGNOSIS — J45909 Unspecified asthma, uncomplicated: Secondary | ICD-10-CM | POA: Insufficient documentation

## 2022-08-27 DIAGNOSIS — N200 Calculus of kidney: Secondary | ICD-10-CM | POA: Diagnosis not present

## 2022-08-27 DIAGNOSIS — I2510 Atherosclerotic heart disease of native coronary artery without angina pectoris: Secondary | ICD-10-CM | POA: Diagnosis not present

## 2022-08-27 DIAGNOSIS — R1031 Right lower quadrant pain: Secondary | ICD-10-CM | POA: Diagnosis not present

## 2022-08-27 MED ORDER — OMEPRAZOLE 40 MG PO CPDR
40.0000 mg | DELAYED_RELEASE_CAPSULE | Freq: Every day | ORAL | 3 refills | Status: DC
Start: 2022-08-27 — End: 2023-10-01

## 2022-08-27 MED ORDER — ONETOUCH ULTRA TEST VI STRP
ORAL_STRIP | 6 refills | Status: DC
Start: 2022-08-27 — End: 2022-09-06

## 2022-08-27 MED ORDER — BASAGLAR KWIKPEN 100 UNIT/ML ~~LOC~~ SOPN
10.0000 [IU] | PEN_INJECTOR | Freq: Every day | SUBCUTANEOUS | 2 refills | Status: DC
Start: 2022-08-27 — End: 2023-10-24

## 2022-08-27 MED ORDER — LOSARTAN POTASSIUM 100 MG PO TABS
100.0000 mg | ORAL_TABLET | Freq: Every day | ORAL | 1 refills | Status: DC
Start: 2022-08-27 — End: 2023-03-12

## 2022-08-27 MED ORDER — DAPAGLIFLOZIN PROPANEDIOL 10 MG PO TABS
10.0000 mg | ORAL_TABLET | Freq: Every day | ORAL | 1 refills | Status: DC
Start: 2022-08-27 — End: 2023-02-20

## 2022-08-27 MED ORDER — AMLODIPINE BESYLATE 10 MG PO TABS
10.0000 mg | ORAL_TABLET | Freq: Every day | ORAL | 1 refills | Status: DC
Start: 2022-08-27 — End: 2023-05-05

## 2022-08-27 MED ORDER — ROSUVASTATIN CALCIUM 20 MG PO TABS
20.0000 mg | ORAL_TABLET | Freq: Every day | ORAL | 3 refills | Status: DC
Start: 2022-08-27 — End: 2023-08-27

## 2022-08-27 MED ORDER — ALBUTEROL SULFATE HFA 108 (90 BASE) MCG/ACT IN AERS
2.0000 | INHALATION_SPRAY | Freq: Four times a day (QID) | RESPIRATORY_TRACT | 2 refills | Status: DC | PRN
Start: 2022-08-27 — End: 2023-03-04

## 2022-08-27 MED ORDER — BUSPIRONE HCL 15 MG PO TABS
15.0000 mg | ORAL_TABLET | Freq: Two times a day (BID) | ORAL | 1 refills | Status: DC
Start: 2022-08-27 — End: 2023-04-04

## 2022-08-27 MED ORDER — SPACER/AERO-HOLDING CHAMBERS DEVI: 0 refills | Status: AC

## 2022-08-27 NOTE — Progress Notes (Signed)
Assessment & Plan:  Phillip Paul "Phillip Paul" was seen today for medical management of chronic issues.  Diagnoses and all orders for this visit:  Primary hypertension -     amLODipine (NORVASC) 10 MG tablet; Take 1 tablet (10 mg total) by mouth daily. -     losartan (COZAAR) 100 MG tablet; Take 1 tablet (100 mg total) by mouth daily. -     CMP14+EGFR  Gall bladder polyp -     US Abdomen Limited RUQ (LIVER/GB); Future  Left leg swelling -     DG Tibia/Fibula Left; Future -     Ambulatory referral to Orthopedics  Coronary artery disease involving native coronary artery of native heart without angina pectoris -     rosuvastatin (CRESTOR) 20 MG tablet; Take 1 tablet (20 mg total) by mouth daily. -     Lipid panel  Controlled type 2 diabetes mellitus with complication, with long-term current use of insulin (HCC) -     dapagliflozin propanediol (FARXIGA) 10 MG TABS tablet; Take 1 tablet (10 mg total) by mouth daily. -     Insulin Glargine (BASAGLAR KWIKPEN) 100 UNIT/ML; Inject 10 Units into the skin daily. -     Hemoglobin A1c  Restrictive airway disease -     albuterol (VENTOLIN HFA) 108 (90 Base) MCG/ACT inhaler; Inhale 2 puffs into the lungs every 6 (six) hours as needed for wheezing or shortness of breath. -     Spacer/Aero-Holding Rudean Curt; Administer insurance approved spacer. ICD 10 J42  Anxiety -     busPIRone (BUSPAR) 15 MG tablet; Take 1 tablet (15 mg total) by mouth 2 (two) times daily.  GERD without esophagitis -     omeprazole (PRILOSEC) 40 MG capsule; Take 1 capsule (40 mg total) by mouth daily.  Isolated proteinuria with morphologic lesion -     Urinalysis, Complete    Patient has been counseled on age-appropriate routine health concerns for screening and prevention. These are reviewed and up-to-date. Referrals have been placed accordingly. Immunizations are up-to-date or declined.    Subjective:   Chief Complaint  Patient presents with   Medical Management  of Chronic Issues   HPI Phillip Paul 72 y.o. male presents to office today for follow up to HTN  He has a past medical history of Abnormal EKG, Anxiety, Chest pain, DM2, diabetic peripheral neuropathy,  Hypertension, Kidney stones, and Racing heart beat.    He has been drinking milk thistle and triggering room tea for his GI issues.  Requesting to repeat ultrasound to see if these natural herbs have decreased the gallbladder mass.   HTN Blood pressure slightly elevated today. He does have a blood pressure monitor at home and reports lower readings at home compared to today's office reading. He endorses adherence taking amlodipine 10 mg daily and losartan 100 mg daily.  BP Readings from Last 3 Encounters:  08/27/22 (!) 147/82  08/20/22 121/60  05/27/22 (!) 141/71     Reports leg leg swelling occurring intermittently. He would like to be referred back to the orthopedic surgeon as he believes the swelling is due to the hardware placed in his leg a few years ago. Most recent venous doppler was negative for DVT (08-20-2022)  Echocardiogram is pending.  No signs of cellulitis on exam today.    Review of Systems  Constitutional:  Negative for fever, malaise/fatigue and weight loss.  HENT: Negative.  Negative for nosebleeds.   Eyes: Negative.  Negative for blurred vision, double vision  and photophobia.  Respiratory: Negative.  Negative for cough and shortness of breath.   Cardiovascular:  Positive for leg swelling. Negative for chest pain and palpitations.  Gastrointestinal: Negative.  Negative for abdominal pain, blood in stool, constipation, diarrhea, heartburn, nausea and vomiting.  Musculoskeletal: Negative.  Negative for myalgias.  Neurological: Negative.  Negative for dizziness, focal weakness, seizures and headaches.  Psychiatric/Behavioral: Negative.  Negative for suicidal ideas.     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)    Diabetes mellitus without complication (HCC)    Dizzy    Hypertension    Kidney stones    Racing heart beat     Past Surgical History:  Procedure Laterality Date   CYSTOSCOPY     LEFT URETEROSCOPIC STONE MANIPULATION AND REMOVAL; PLACEMENT OF  LEFT DOUBLE-J URETERAL STENT   CYSTOSCOPY W/ URETERAL STENT PLACEMENT     LEFT DOUBLE-J URETERAL STENT   ORIF ANKLE FRACTURE Left 08/17/2012   Procedure: OPEN REDUCTION INTERNAL FIXATION (ORIF) ANKLE FRACTURE/Left;  Surgeon: Toni Arthurs, MD;  Location: [facility] OR;  Service: Orthopedics;  Laterality: Left;   TIBIA IM NAIL INSERTION Left 08/17/2012   Procedure: INTRAMEDULLARY (IM) NAIL TIBIAL/Left;  Surgeon: Toni Arthurs, MD;  Location: [facility] OR;  Service: Orthopedics;  Laterality: Left;    Family History  Problem Relation Age of Onset   Hypertension Father        lived to 55   Other Brother        MVA   Kidney disease Sister    Other Brother        Stomach problems    Social History Reviewed with no changes to be made today.   Outpatient Medications Prior to Visit  Medication Sig Dispense Refill   aspirin EC 81 MG tablet Take 1 tablet (81 mg total) by mouth daily. Swallow whole. 90 tablet 3   Blood Glucose Monitoring Suppl (ONE TOUCH ULTRA 2) w/Device KIT Check blood sugar by fingerstick 2x a day. ICD10 E11.8 and E11.65 1 each 0   cholecalciferol (VITAMIN D) 1000 units tablet Take 1,000 Units by mouth daily.     COENZYME Q10 PO Take by mouth.     fish oil-omega-3 fatty acids 1000 MG capsule Take 1 g by mouth daily.     fluticasone (FLONASE) 50 MCG/ACT nasal spray Use 2 spray(s) in each nostril once daily 48 g 0   Insulin Pen Needle 31G X 5 MM MISC Use as instructed. 100  each 12   NON FORMULARY [facility]  Anti-fungal (nail)-#1     ONETOUCH ULTRA TEST test strip USE AS DIRECTED TWICE DAILY 200 each 0   pregabalin (LYRICA) 100 MG capsule Take 1 capsule (100 mg total) by mouth 2 (two) times daily. 60 capsule 2   TRUEPLUS LANCETS 28G MISC Use as instructed 100 each 3   albuterol (VENTOLIN HFA) 108 (90 Base) MCG/ACT inhaler Inhale 2 puffs into the lungs every 6 (six) hours as needed for wheezing or shortness of breath. 8 g 2   amLODipine (NORVASC) 10 MG tablet Take 1 tablet by mouth once daily 90 tablet 0  busPIRone (BUSPAR) 15 MG tablet TAKE 1 TABLET BY MOUTH TWICE DAILY FOR ANXIETY 180 tablet 0   dapagliflozin propanediol (FARXIGA) 10 MG TABS tablet Take 1 tablet by mouth once daily 90 tablet 0   Insulin Glargine (BASAGLAR KWIKPEN) 100 UNIT/ML Inject 10 Units into the skin daily. 15 mL 2   losartan (COZAAR) 100 MG tablet Take 1 tablet (100 mg total) by mouth daily. 90 tablet 1   omeprazole (PRILOSEC) 40 MG capsule Take 1 capsule by mouth once daily 90 capsule 3   rosuvastatin (CRESTOR) 20 MG tablet TAKE 1 TABLET BY MOUTH ONCE DAILY . APPOINTMENT REQUIRED FOR FUTURE REFILLS 15 tablet 0   No facility-administered medications prior to visit.    Allergies  Allergen Reactions   Tylenol [Acetaminophen] Other (See Comments)    ELEVATED LFTs       Objective:    BP (!) 147/82 (BP Location: Left Arm, Patient Position: Sitting, Cuff Size: Normal)   Pulse 87   Ht 5\' 11"  (1.803 m)   Wt 211 lb 9.6 oz (96 kg)   SpO2 96%   BMI 29.51 kg/m  Wt Readings from Last 3 Encounters:  08/27/22 211 lb 9.6 oz (96 kg)  08/20/22 212 lb 6.4 oz (96.3 kg)  05/27/22 211 lb (95.7 kg)    Physical Exam Vitals and nursing note reviewed.  Constitutional:      Appearance: He is well-developed.  HENT:     Head: Normocephalic and atraumatic.  Cardiovascular:     Rate and Rhythm: Normal rate and regular rhythm.     Heart sounds: Normal heart sounds. No murmur heard.     No friction rub. No gallop.  Pulmonary:     Effort: Pulmonary effort is normal. No tachypnea or respiratory distress.     Breath sounds: Normal breath sounds. No decreased breath sounds, wheezing, rhonchi or rales.  Chest:     Chest wall: No tenderness.  Abdominal:     General: Bowel sounds are normal.     Palpations: Abdomen is soft.  Musculoskeletal:        General: Normal range of motion.     Cervical back: Normal range of motion.     Left lower leg: Swelling present. No deformity, lacerations, tenderness or bony tenderness. No edema.  Skin:    General: Skin is warm and dry.  Neurological:     Mental Status: He is alert and oriented to person, place, and time.     Coordination: Coordination normal.  Psychiatric:        Behavior: Behavior normal. Behavior is cooperative.        Thought Content: Thought content normal.        Judgment: Judgment normal.          Patient has been counseled extensively about nutrition and exercise as well as the importance of adherence with medications and regular follow-up. The patient was given clear instructions to go to ER or return to medical center if symptoms don't improve, worsen or new problems develop. The patient verbalized understanding.   Follow-up: Return in about 3 months (around 11/27/2022).   Claiborne Rigg, FNP-BC [facility] and [facility] [place], [place] [phone]   08/27/2022, 10:25 AM

## 2022-08-27 NOTE — Patient Instructions (Addendum)
Phillip Paul. Doran Durand, MD Address: [address] [place], [place], [place] [address] Phone: [phone]

## 2022-08-29 ENCOUNTER — Ambulatory Visit: Payer: Medicare Other | Admitting: Podiatry

## 2022-09-03 ENCOUNTER — Other Ambulatory Visit: Payer: Self-pay | Admitting: Nurse Practitioner

## 2022-09-03 DIAGNOSIS — R109 Unspecified abdominal pain: Secondary | ICD-10-CM

## 2022-09-05 ENCOUNTER — Encounter: Payer: Self-pay | Admitting: Nurse Practitioner

## 2022-09-05 ENCOUNTER — Ambulatory Visit (INDEPENDENT_AMBULATORY_CARE_PROVIDER_SITE_OTHER): Payer: Medicare Other | Admitting: Podiatry

## 2022-09-05 ENCOUNTER — Ambulatory Visit (HOSPITAL_COMMUNITY): Payer: Medicare Other | Attending: Physician Assistant

## 2022-09-05 ENCOUNTER — Encounter: Payer: Self-pay | Admitting: Podiatry

## 2022-09-05 DIAGNOSIS — I351 Nonrheumatic aortic (valve) insufficiency: Secondary | ICD-10-CM | POA: Insufficient documentation

## 2022-09-05 DIAGNOSIS — L03116 Cellulitis of left lower limb: Secondary | ICD-10-CM

## 2022-09-05 DIAGNOSIS — Z9889 Other specified postprocedural states: Secondary | ICD-10-CM | POA: Diagnosis not present

## 2022-09-05 DIAGNOSIS — Z794 Long term (current) use of insulin: Secondary | ICD-10-CM

## 2022-09-05 DIAGNOSIS — E118 Type 2 diabetes mellitus with unspecified complications: Secondary | ICD-10-CM | POA: Diagnosis not present

## 2022-09-05 DIAGNOSIS — Z01810 Encounter for preprocedural cardiovascular examination: Secondary | ICD-10-CM | POA: Insufficient documentation

## 2022-09-05 DIAGNOSIS — D2121 Benign neoplasm of connective and other soft tissue of right lower limb, including hip: Secondary | ICD-10-CM | POA: Diagnosis not present

## 2022-09-05 DIAGNOSIS — I2510 Atherosclerotic heart disease of native coronary artery without angina pectoris: Secondary | ICD-10-CM | POA: Diagnosis not present

## 2022-09-05 DIAGNOSIS — R600 Localized edema: Secondary | ICD-10-CM

## 2022-09-05 LAB — ECHOCARDIOGRAM COMPLETE
Area-P 1/2: 3.39 cm2
P 1/2 time: 773 msec
S' Lateral: 2.8 cm

## 2022-09-05 MED ORDER — PREGABALIN 100 MG PO CAPS: 100.0000 mg | ORAL_CAPSULE | Freq: Two times a day (BID) | ORAL | 2 refills | Status: DC

## 2022-09-05 MED ORDER — DOXYCYCLINE HYCLATE 100 MG PO TABS: 100.0000 mg | ORAL_TABLET | Freq: Two times a day (BID) | ORAL | 0 refills | Status: AC

## 2022-09-05 NOTE — Progress Notes (Signed)
Subjective:  Patient ID: Phillip Paul, male    DOB: [birth year],  MRN: [id number]  Chief Complaint  Patient presents with   Routine Post Op    DOS 01/16/22: Plantar Fibroma Left "It's doing better.  It swells a lot.  I'm Diabetic.  I think I may have some nerve damage from where he did the surgery."    DOS: 01/16/2022 Procedure: Excision of plantar fibroma right foot  72 y.o. male returns for post-op check.  Patient here for postoperative check after surgery on the right foot approximately 8 mo ago.  Patient states he is doing very well in regards to the right foot plantar fibroma that was excised.  He does have occasional burning and shooting pains around the area where the incision has however it does not bother him all the time.  He says he is taking Lyrica 100 mg twice daily which does help a lot with his nerve pain.  Says that is manageable and he is back to doing a lot of walking.  He does note that recently he had a lot of swelling in both legs also has some redness in the left leg.  He was sent for an ultrasound to evaluate for blood clot and it was negative for DVT.  Review of Systems: Negative except as noted in the HPI. Denies N/V/F/Ch.   Objective:   There were no vitals filed for this visit.  There is no height or weight on file to calculate BMI. Constitutional Well developed. Well nourished.  Vascular Foot warm and well perfused. Capillary refill normal to all digits.  Left calf is slightly firm with palpation and side-to-side compression.  There is redness of the left lower extremity.  Edema is noted bilateral lower extremity from the knee down.  Neurologic Normal speech. Oriented to person, place, and time. Epicritic sensation to light touch grossly present bilaterally.  Dermatologic Incision well healed.  No dehiscence it is now well-healed.  There is mild scar tissue at the incision however decreased from prior.  Minimal to no tenderness with palpation at the  surgical site and there is no firm mass present no indication of recurrence of the fibroma.  Orthopedic: Decreased tenderness to palpation noted about the surgical site.   Multiple view plain film radiographs: Deferred at this visit as the patient had soft tissue procedure only. Assessment:   1. Cellulitis of left lower extremity without foot   2. Fibroma of right foot   3. Controlled type 2 diabetes mellitus with complication, with long-term current use of insulin (HCC)   4. Post-operative state      Plan:  Patient was evaluated and treated and all questions answered.  # Cellulitis of left lower extremity, bilateral lower extremity edema -Patient does have a history of prior tibial nail in the left leg.  Has an appointment with Dr. Victorino Dike next week to further evaluate this edema and cellulitis -In the meantime I recommend doxycycline 100 mg twice daily for mild redness and edema noted on the left lower extremity. -Patient has had ultrasound to rule out DVT of the left lower extremity which was negative -Will place referral to vein center at [facility] for further evaluation of bilateral lower extremity edema  S/p foot surgery right foot plantar fibroma excision -Fully healed postoperatively in regards to the plantar  -Overall patient is continuing to improve there is no evidence of recurrence of plantar fibroma in the right foot -Any residual pain is likely nerve pain treated as below  #  Neuropathic pain in the bilateral foot -E Rx for Lyrica 100 mg take twice daily as needed for neuropathic pain sent to the patient's pharmacy. -Discussed the risk benefits and side effects associate with Lyrica.  He should discontinue gabapentin when he begins taking this     No follow-ups on file.         Corinna Gab, DPM [facility] / [facility]

## 2022-09-06 ENCOUNTER — Ambulatory Visit
Admission: RE | Admit: 2022-09-06 | Discharge: 2022-09-06 | Disposition: A | Discharge status: Home / Self Care | Payer: Medicare Other | Source: Ambulatory Visit | Attending: Nurse Practitioner | Admitting: Nurse Practitioner

## 2022-09-06 ENCOUNTER — Encounter: Payer: Self-pay | Admitting: Physician Assistant

## 2022-09-06 ENCOUNTER — Other Ambulatory Visit: Payer: Self-pay | Admitting: Pharmacist

## 2022-09-06 ENCOUNTER — Other Ambulatory Visit: Payer: Self-pay | Admitting: Nurse Practitioner

## 2022-09-06 DIAGNOSIS — I351 Nonrheumatic aortic (valve) insufficiency: Secondary | ICD-10-CM | POA: Insufficient documentation

## 2022-09-06 DIAGNOSIS — K829 Disease of gallbladder, unspecified: Secondary | ICD-10-CM | POA: Diagnosis not present

## 2022-09-06 DIAGNOSIS — Z794 Long term (current) use of insulin: Secondary | ICD-10-CM

## 2022-09-06 DIAGNOSIS — K824 Cholesterolosis of gallbladder: Secondary | ICD-10-CM

## 2022-09-06 HISTORY — DX: Nonrheumatic aortic (valve) insufficiency: I35.1

## 2022-09-06 MED ORDER — ONETOUCH DELICA LANCETS 33G MISC
6 refills | Status: DC
Start: 2022-09-06 — End: 2022-09-09

## 2022-09-06 MED ORDER — ONETOUCH VERIO VI STRP
ORAL_STRIP | 6 refills | Status: AC
Start: 2022-09-06 — End: ?

## 2022-09-06 MED ORDER — ONETOUCH VERIO W/DEVICE KIT
PACK | 0 refills | Status: DC
Start: 2022-09-06 — End: 2022-09-09

## 2022-09-06 NOTE — Telephone Encounter (Signed)
Can someone assist with this?

## 2022-09-07 ENCOUNTER — Other Ambulatory Visit: Payer: Self-pay | Admitting: Family Medicine

## 2022-09-07 DIAGNOSIS — E118 Type 2 diabetes mellitus with unspecified complications: Secondary | ICD-10-CM

## 2022-09-07 DIAGNOSIS — E785 Hyperlipidemia, unspecified: Secondary | ICD-10-CM

## 2022-09-08 NOTE — Telephone Encounter (Signed)
THANKS LUKE!!

## 2022-09-09 ENCOUNTER — Telehealth: Payer: Self-pay | Admitting: Cardiology

## 2022-09-09 MED ORDER — ACCU-CHEK SOFTCLIX LANCETS MISC
6 refills | Status: AC
Start: 2022-09-09 — End: ?

## 2022-09-09 MED ORDER — ACCU-CHEK GUIDE VI STRP
ORAL_STRIP | 6 refills | Status: AC
Start: 2022-09-09 — End: ?

## 2022-09-09 MED ORDER — ACCU-CHEK GUIDE W/DEVICE KIT
PACK | 0 refills | Status: DC
Start: 2022-09-09 — End: 2022-10-30

## 2022-09-09 NOTE — Telephone Encounter (Signed)
Will forward to pre op APP to review if pt has been cleared.

## 2022-09-09 NOTE — Telephone Encounter (Signed)
Caller Vernona Rieger) called to follow-up on status of patient's clearance.  Caller stated can fax clearance to fax# [phone].

## 2022-09-10 NOTE — Telephone Encounter (Signed)
   Name: Maziah Glaspell Agerton  DOB: October 25, 1950  MRN: [id number]   Primary Cardiologist: Donato Schultz, MD  Chart reviewed as part of pre-operative protocol coverage. Aslzadeh Masood Buelow was last seen on 08/20/2022 by Tereso Newcomer, PA-C.  A cardiac murmur was heard at that time. He underwent echo which showed mild to moderate aortic regurgitation. He was felt to be an acceptable risk for surgery.  Therefore, based on ACC/AHA guidelines, the patient would be at acceptable risk for the planned procedure without further cardiovascular testing.   Ideally aspirin should be continued without interruption, however if the bleeding risk is too great, aspirin may be held for 5-7 days prior to surgery. Please resume aspirin post operatively when it is felt to be safe from a bleeding standpoint.    I will route this recommendation to the requesting party via Epic fax function and remove from pre-op pool. Please call with questions.  Carlos Levering, NP 09/10/2022, 11:35 AM

## 2022-09-11 DIAGNOSIS — M79672 Pain in left foot: Secondary | ICD-10-CM | POA: Diagnosis not present

## 2022-09-11 DIAGNOSIS — R2242 Localized swelling, mass and lump, left lower limb: Secondary | ICD-10-CM | POA: Diagnosis not present

## 2022-09-13 ENCOUNTER — Encounter: Payer: Self-pay | Admitting: Nurse Practitioner

## 2022-09-18 DIAGNOSIS — F1120 Opioid dependence, uncomplicated: Secondary | ICD-10-CM | POA: Diagnosis not present

## 2022-09-19 ENCOUNTER — Ambulatory Visit
Admission: RE | Admit: 2022-09-19 | Discharge: 2022-09-19 | Disposition: A | Discharge status: Home / Self Care | Payer: Medicare Other | Source: Ambulatory Visit | Attending: Nurse Practitioner | Admitting: Nurse Practitioner

## 2022-09-19 DIAGNOSIS — R109 Unspecified abdominal pain: Secondary | ICD-10-CM

## 2022-09-19 DIAGNOSIS — K429 Umbilical hernia without obstruction or gangrene: Secondary | ICD-10-CM | POA: Diagnosis not present

## 2022-09-19 DIAGNOSIS — I700 Atherosclerosis of aorta: Secondary | ICD-10-CM | POA: Diagnosis not present

## 2022-09-19 DIAGNOSIS — N200 Calculus of kidney: Secondary | ICD-10-CM | POA: Diagnosis not present

## 2022-09-20 ENCOUNTER — Other Ambulatory Visit: Payer: Self-pay | Admitting: Nurse Practitioner

## 2022-09-20 DIAGNOSIS — R42 Dizziness and giddiness: Secondary | ICD-10-CM

## 2022-09-22 DIAGNOSIS — F1120 Opioid dependence, uncomplicated: Secondary | ICD-10-CM | POA: Diagnosis not present

## 2022-10-02 ENCOUNTER — Encounter: Payer: Self-pay | Admitting: Podiatry

## 2022-10-03 ENCOUNTER — Other Ambulatory Visit (INDEPENDENT_AMBULATORY_CARE_PROVIDER_SITE_OTHER): Payer: Medicare Other | Admitting: Podiatry

## 2022-10-03 ENCOUNTER — Telehealth: Payer: Self-pay | Admitting: *Deleted

## 2022-10-03 DIAGNOSIS — G5790 Unspecified mononeuropathy of unspecified lower limb: Secondary | ICD-10-CM

## 2022-10-03 NOTE — Telephone Encounter (Signed)
Pt given CT results per notes of Z. Meredeth Ide, NP from 10/03/22 on 10/03/22. Pt verbalized understanding and reports he has been taking a "herb" for his liver, kidney , and gallbladder and it is helping . Pain is better and disappearing for now. Patient wants to tell PCP "hi" and will see her next appt.

## 2022-10-03 NOTE — Progress Notes (Signed)
Referral to neurology placed.

## 2022-10-06 ENCOUNTER — Other Ambulatory Visit: Payer: Self-pay | Admitting: Nurse Practitioner

## 2022-10-06 DIAGNOSIS — F1120 Opioid dependence, uncomplicated: Secondary | ICD-10-CM | POA: Diagnosis not present

## 2022-10-06 DIAGNOSIS — R42 Dizziness and giddiness: Secondary | ICD-10-CM

## 2022-10-20 DIAGNOSIS — F1120 Opioid dependence, uncomplicated: Secondary | ICD-10-CM | POA: Diagnosis not present

## 2022-10-24 DIAGNOSIS — F1120 Opioid dependence, uncomplicated: Secondary | ICD-10-CM | POA: Diagnosis not present

## 2022-10-30 ENCOUNTER — Other Ambulatory Visit: Payer: Self-pay | Admitting: Family Medicine

## 2022-10-30 DIAGNOSIS — E1169 Type 2 diabetes mellitus with other specified complication: Secondary | ICD-10-CM

## 2022-10-30 DIAGNOSIS — R42 Dizziness and giddiness: Secondary | ICD-10-CM

## 2022-11-03 DIAGNOSIS — F1120 Opioid dependence, uncomplicated: Secondary | ICD-10-CM | POA: Diagnosis not present

## 2022-11-05 ENCOUNTER — Telehealth: Payer: Self-pay | Admitting: Cardiology

## 2022-11-05 NOTE — Telephone Encounter (Signed)
Pt c/o of Chest Pain: STAT if active CP, including tightness, pressure, jaw pain, radiating pain to shoulder/upper arm/back, CP unrelieved by Nitro. Symptoms reported of SOB, nausea, vomiting, sweating.  1. Are you having CP right now? No     2. Are you experiencing any other symptoms (ex. SOB, nausea, vomiting, sweating)? SOB last couple of months especially when walking up a hill.   3. Is your CP continuous or coming and going? comes and goes and have been seen at ED in past   4. Have you taken Nitroglycerin? No    5. How long have you been experiencing CP? Since last seen in ED    6. If NO CP at time of call then end call with telling Pt to call back or call 911 if Chest pain returns prior to return call from triage team. .   Had Echo Cardiogram a few months ago. Not had stress test. They say it is stress or anxiety related.    Answered received via pt schedules

## 2022-11-05 NOTE — Telephone Encounter (Signed)
Attempted to call patient back regarding his report of chronic chest pain. Per message he was seen in ED 03/30/22, was last seen in our office by Tereso Newcomer for pre-op clearance on 08/20/22. He was ordered an echo and a BLE venous doppler. Both results were acceptable for him to proceed with surgery. Lipids were also run that month and levels were normal per PCP.   Called to patient on both  #'s listed in epic. I was unable to leave a VM on his mobile #, also placed a call to Jarvis Morgan who is listed as "friend" as well as to  # that is listed as home #. I left a message with no identifiers asking recipient to call our office #.

## 2022-11-17 DIAGNOSIS — F1120 Opioid dependence, uncomplicated: Secondary | ICD-10-CM | POA: Diagnosis not present

## 2022-11-21 DIAGNOSIS — F1120 Opioid dependence, uncomplicated: Secondary | ICD-10-CM | POA: Diagnosis not present

## 2022-11-25 NOTE — Telephone Encounter (Signed)
Pt has not returned calls.  Will close this encounter for now and await a return call.

## 2022-11-27 ENCOUNTER — Encounter: Payer: Self-pay | Admitting: Nurse Practitioner

## 2022-11-27 ENCOUNTER — Ambulatory Visit: Payer: Medicare HMO | Attending: Nurse Practitioner | Admitting: Nurse Practitioner

## 2022-11-27 VITALS — BP 126/69 | HR 94 | Ht 71.0 in | Wt 213.4 lb

## 2022-11-27 DIAGNOSIS — R42 Dizziness and giddiness: Secondary | ICD-10-CM | POA: Diagnosis not present

## 2022-11-27 DIAGNOSIS — Z7984 Long term (current) use of oral hypoglycemic drugs: Secondary | ICD-10-CM

## 2022-11-27 DIAGNOSIS — Z23 Encounter for immunization: Secondary | ICD-10-CM

## 2022-11-27 DIAGNOSIS — E119 Type 2 diabetes mellitus without complications: Secondary | ICD-10-CM | POA: Diagnosis not present

## 2022-11-27 DIAGNOSIS — Z794 Long term (current) use of insulin: Secondary | ICD-10-CM | POA: Diagnosis not present

## 2022-11-27 DIAGNOSIS — E118 Type 2 diabetes mellitus with unspecified complications: Secondary | ICD-10-CM | POA: Diagnosis not present

## 2022-11-27 LAB — POCT GLYCOSYLATED HEMOGLOBIN (HGB A1C): Hemoglobin A1C: 7.7 % — AB (ref 4.0–5.6)

## 2022-11-27 MED ORDER — FLUTICASONE PROPIONATE 50 MCG/ACT NA SUSP
2.0000 | Freq: Every day | NASAL | 1 refills | Status: DC
Start: 2022-11-27 — End: 2023-06-02

## 2022-11-27 MED ORDER — OZEMPIC (0.25 OR 0.5 MG/DOSE) 2 MG/3ML ~~LOC~~ SOPN
0.2500 mg | PEN_INJECTOR | SUBCUTANEOUS | 1 refills | Status: DC
Start: 2022-11-27 — End: 2023-01-18

## 2022-11-27 NOTE — Progress Notes (Signed)
Assessment & Plan:  Phillip Masood "Max" was seen today for medical management of chronic issues.  Diagnoses and all orders for this visit:  Diabetes mellitus treated with oral medication (HCC) A1c not at goal. Adding Ozempic today.  -     POCT glycosylated hemoglobin (Hb A1C) -     Urine Albumin/Creatinine with ratio (send out) [LAB689] -     CMP14+EGFR -     Semaglutide,0.25 or 0.5MG /DOS, (OZEMPIC, 0.25 OR 0.5 MG/DOSE,) 2 MG/3ML SOPN; Inject 0.25 mg into the skin once a week.  Vertigo -     fluticasone (FLONASE) 50 MCG/ACT nasal spray; Place 2 sprays into both nostrils daily.    Patient has been counseled on age-appropriate routine health concerns for screening and prevention. These are reviewed and up-to-date. Referrals have been placed accordingly. Immunizations are up-to-date or declined.    Subjective:   Chief Complaint  Patient presents with   Medical Management of Chronic Issues    Phillip Paul 72 y.o. male presents to office today for follow up to DM and HTN  He has been drinking milk thistle and triggering room tea for his GI issues.  Requesting to repeat ultrasound to see if these natural herbs have decreased the gallbladder mass.    DM 2 A1c not at goal. He is not exercising due to chronic RLE pain. Also not consistently dietary adherent. Taking farxiga 10 mg daily as prescribed. Could not tolerate metformin. LDL at goal  Lab Results  Component Value Date   HGBA1C 7.7 (A) 11/27/2022   Lab Results  Component Value Date   LDLCALC 65 08/27/2022    HTN Blood pressure is well controlled.  He endorses adherence taking amlodipine 10 mg daily and losartan 100 mg daily.  BP Readings from Last 3 Encounters:  11/27/22 126/69  08/27/22 (!) 147/82  08/20/22 121/60        Review of Systems  Constitutional:  Negative for fever, malaise/fatigue and weight loss.  HENT: Negative.  Negative for nosebleeds.   Eyes: Negative.  Negative for blurred vision,  double vision and photophobia.  Respiratory: Negative.  Negative for cough and shortness of breath.   Cardiovascular:  Positive for leg swelling. Negative for chest pain and palpitations.  Gastrointestinal: Negative.  Negative for heartburn, nausea and vomiting.  Musculoskeletal: Negative.  Negative for myalgias.  Neurological: Negative.  Negative for dizziness, focal weakness, seizures and headaches.  Psychiatric/Behavioral: Negative.  Negative for suicidal ideas.     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)    Diabetes mellitus without complication (HCC)    Dizzy    Hypertension    Kidney stones    Racing heart beat     Past Surgical History:  Procedure Laterality Date   CYSTOSCOPY     LEFT URETEROSCOPIC STONE MANIPULATION AND  REMOVAL; PLACEMENT OF  LEFT DOUBLE-J URETERAL STENT   CYSTOSCOPY W/ URETERAL STENT PLACEMENT     LEFT DOUBLE-J URETERAL STENT   ORIF ANKLE FRACTURE Left 08/17/2012   Procedure: OPEN REDUCTION INTERNAL FIXATION (ORIF) ANKLE FRACTURE/Left;  Surgeon: Toni Arthurs, MD;  Location: [facility] OR;  Service: Orthopedics;  Laterality: Left;   TIBIA IM NAIL INSERTION Left 08/17/2012   Procedure: INTRAMEDULLARY (IM) NAIL TIBIAL/Left;  Surgeon: Toni Arthurs, MD;  Location: [facility] OR;  Service: Orthopedics;  Laterality: Left;    Family History  Problem Relation Age of Onset   Hypertension Father        lived to 53   Other Brother        MVA    Kidney disease Sister    Other Brother        Stomach problems    Social History Reviewed with no changes to be made today.   Outpatient Medications Prior to Visit  Medication Sig Dispense Refill   Accu-Chek Softclix Lancets lancets Use to check blood sugar 3 times daily. E11.69 100 each 6   albuterol (VENTOLIN HFA) 108 (90 Base) MCG/ACT inhaler Inhale 2 puffs into the lungs every 6 (six) hours as needed for wheezing or shortness of breath. 8 g 2   amLODipine (NORVASC) 10 MG tablet Take 1 tablet (10 mg total) by mouth daily. 90 tablet 1   aspirin EC 81 MG tablet Take 1 tablet (81 mg total) by mouth daily. Swallow whole. 90 tablet 3   Blood Glucose Monitoring Suppl (ONETOUCH VERIO FLEX SYSTEM) w/Device KIT USE AS DIRECTED THREE TIMES DAILY TO CHECK GLUCOSE 1 kit 0   busPIRone (BUSPAR) 15 MG tablet Take 1 tablet (15 mg total) by mouth 2 (two) times daily. 180 tablet 1   cholecalciferol (VITAMIN D) 1000 units tablet Take 1,000 Units by mouth daily.     COENZYME Q10 PO Take by mouth.     dapagliflozin propanediol (FARXIGA) 10 MG TABS tablet Take 1 tablet (10 mg total) by mouth daily. 90 tablet 1   fish oil-omega-3 fatty acids 1000 MG capsule Take 1 g by mouth daily.     glucose blood (ACCU-CHEK GUIDE) test strip Use to check blood sugar 3 times daily. E11.69 100 each 6   Insulin Glargine (BASAGLAR KWIKPEN) 100 UNIT/ML Inject 10 Units into the skin daily. 15 mL 2   Insulin Pen Needle 31G X 5 MM MISC Use as instructed. 100 each 12   losartan (COZAAR) 100 MG tablet Take 1 tablet (100 mg total) by mouth daily. 90 tablet 1   NON FORMULARY [facility]  Anti-fungal (nail)-#1     omeprazole (PRILOSEC) 40 MG capsule Take 1 capsule (40 mg total) by mouth daily. 90 capsule 3   pregabalin (LYRICA) 100 MG capsule Take 1 capsule (100 mg total) by mouth 2 (two) times daily. 60 capsule 2   pregabalin (LYRICA) 100 MG capsule Take 1 capsule (100 mg total) by mouth 2 (two) times daily. 60 capsule 2    rosuvastatin (CRESTOR) 20 MG tablet Take 1 tablet (20 mg total) by mouth daily. 90 tablet 3   Spacer/Aero-Holding [employer] approved spacer. ICD 10 J42 1 Units 0   fluticasone (FLONASE) 50 MCG/ACT nasal spray Use 2 spray(s) in each nostril once daily 48 g 0   No facility-administered medications prior to visit.    Allergies  Allergen Reactions   Tylenol [Acetaminophen] Other (See Comments)    ELEVATED LFTs  Objective:    BP 126/69 (BP Location: Left Arm, Patient Position: Sitting, Cuff Size: Normal)   Pulse 94   Ht 5\' 11"  (1.803 m)   Wt 213 lb 6.4 oz (96.8 kg)   SpO2 99%   BMI 29.76 kg/m  Wt Readings from Last 3 Encounters:  11/27/22 213 lb 6.4 oz (96.8 kg)  08/27/22 211 lb 9.6 oz (96 kg)  08/20/22 212 lb 6.4 oz (96.3 kg)    Physical Exam Vitals and nursing note reviewed.  Constitutional:      Appearance: He is well-developed.  HENT:     Head: Normocephalic and atraumatic.  Cardiovascular:     Rate and Rhythm: Normal rate and regular rhythm.     Heart sounds: Normal heart sounds. No murmur heard.    No friction rub. No gallop.  Pulmonary:     Effort: Pulmonary effort is normal. No tachypnea or respiratory distress.     Breath sounds: Normal breath sounds. No decreased breath sounds, wheezing, rhonchi or rales.  Chest:     Chest wall: No tenderness.  Abdominal:     General: Bowel sounds are normal.     Palpations: Abdomen is soft.  Musculoskeletal:        General: Normal range of motion.     Cervical back: Normal range of motion.     Right lower leg: Swelling present.  Skin:    General: Skin is warm and dry.  Neurological:     Mental Status: He is alert and oriented to person, place, and time.     Coordination: Coordination normal.  Psychiatric:        Behavior: Behavior normal. Behavior is cooperative.        Thought Content: Thought content normal.        Judgment: Judgment normal.          Patient has been counseled  extensively about nutrition and exercise as well as the importance of adherence with medications and regular follow-up. The patient was given clear instructions to go to ER or return to medical center if symptoms don't improve, worsen or new problems develop. The patient verbalized understanding.   Follow-up: Return in about 3 months (around 02/27/2023).   Claiborne Rigg, FNP-BC [facility] [place], [place] [phone]   11/27/2022, 12:53 PM

## 2022-11-28 LAB — CMP14+EGFR
ALT: 23 [IU]/L (ref 0–44)
AST: 25 [IU]/L (ref 0–40)
Albumin: 4.4 g/dL (ref 3.8–4.8)
Alkaline Phosphatase: 102 [IU]/L (ref 44–121)
BUN/Creatinine Ratio: 22 (ref 10–24)
BUN: 15 mg/dL (ref 8–27)
Bilirubin Total: 0.3 mg/dL (ref 0.0–1.2)
CO2: 21 mmol/L (ref 20–29)
Calcium: 9.4 mg/dL (ref 8.6–10.2)
Chloride: 101 mmol/L (ref 96–106)
Creatinine, Ser: 0.68 mg/dL — ABNORMAL LOW (ref 0.76–1.27)
Globulin, Total: 3.4 g/dL (ref 1.5–4.5)
Glucose: 144 mg/dL — ABNORMAL HIGH (ref 70–99)
Potassium: 4 mmol/L (ref 3.5–5.2)
Sodium: 139 mmol/L (ref 134–144)
Total Protein: 7.8 g/dL (ref 6.0–8.5)
eGFR: 99 mL/min/{1.73_m2} (ref >59)

## 2022-11-28 LAB — MICROALBUMIN / CREATININE URINE RATIO
Creatinine, Urine: 32.2 mg/dL
Microalb/Creat Ratio: 289 mg/g{creat} — ABNORMAL HIGH (ref 0–29)
Microalbumin, Urine: 93.1 ug/mL

## 2022-11-29 DIAGNOSIS — F1120 Opioid dependence, uncomplicated: Secondary | ICD-10-CM | POA: Diagnosis not present

## 2022-12-01 ENCOUNTER — Encounter: Payer: Self-pay | Admitting: Nurse Practitioner

## 2022-12-01 DIAGNOSIS — F1120 Opioid dependence, uncomplicated: Secondary | ICD-10-CM | POA: Diagnosis not present

## 2022-12-04 DIAGNOSIS — Z008 Encounter for other general examination: Secondary | ICD-10-CM | POA: Diagnosis not present

## 2022-12-05 ENCOUNTER — Ambulatory Visit (INDEPENDENT_AMBULATORY_CARE_PROVIDER_SITE_OTHER): Payer: Medicare HMO | Admitting: Podiatry

## 2022-12-05 ENCOUNTER — Encounter: Payer: Self-pay | Admitting: Podiatry

## 2022-12-05 DIAGNOSIS — G5790 Unspecified mononeuropathy of unspecified lower limb: Secondary | ICD-10-CM

## 2022-12-05 DIAGNOSIS — D2121 Benign neoplasm of connective and other soft tissue of right lower limb, including hip: Secondary | ICD-10-CM

## 2022-12-05 NOTE — Progress Notes (Signed)
  Subjective:  Patient ID: Stephens November, male    DOB: 1950-12-17,  MRN: [id number]  Chief Complaint  Patient presents with   Routine Post Op    "It's doing okay, I guess.  I needed to get a 90 day supply of medicine, he gave me a 30 day with 2 refills.  I need a 90 day prescription."    DOS: 01/16/2022 Procedure: Excision of plantar fibroma right foot  72 y.o. male returns for post-op check.   Previously placed on Lyrica 100 mg twice daily.  Patient states he is doing better.  He is having still some burning pain around the incision on the bottom aspect of the right foot overall though doing well.  Going to see neurologist.  Has been taking more natural remedies which seems to be helping with some of his pain and swelling in both legs.  He went to see vascular specialist who evaluated the edema did not feel there was anything serious going on in regards to arterial flow or DVT.  Review of Systems: Negative except as noted in the HPI. Denies N/V/F/Ch.   Objective:   There were no vitals filed for this visit.  There is no height or weight on file to calculate BMI. Constitutional Well developed. Well nourished.  Vascular Foot warm and well perfused. Capillary refill normal to all digits.  Left calf is slightly firm with palpation and side-to-side compression.  There is redness of the left lower extremity.  Edema is noted bilateral lower extremity from the knee down.  Neurologic Normal speech. Oriented to person, place, and time. Epicritic sensation to light touch grossly present bilaterally.  Dermatologic Incision well healed.  No dehiscence it is now well-healed.  There is mild scar tissue at the incision however decreased from prior.  Minimal to no tenderness with palpation at the surgical site and there is no firm mass present no indication of recurrence of the fibroma.  Orthopedic: Decreased tenderness to palpation noted about the surgical site.   Multiple view plain film  radiographs: Deferred at this visit as the patient had soft tissue procedure only. Assessment:   1. Neuritis of lower extremity, unspecified laterality   2. Fibroma of right foot       Plan:  Patient was evaluated and treated and all questions answered.  # Cellulitis of left lower extremity, bilateral lower extremity edema -Slightly improved from prior or stable at least -Continue to monitor  S/p foot surgery right foot plantar fibroma excision -Fully healed postoperatively in regards to the plantar  -Overall patient is continuing to improve there is no evidence of recurrence of plantar fibroma in the right foot -Any residual pain is likely nerve pain treated as below  # Neuropathic pain in the bilateral foot -Continue Lyrica or gabapentin as needed patient states he does not want another refill at this time going to continue with natural remedies which have been helping -Going to see neurologist.  Discussed that he should ask about Qutenza treatments see if this will have any benefit to him if needed we are happy to provide Qutenza application in our office if felt that it would be helpful  Return if symptoms worsen or fail to improve.         Corinna Gab, DPM [facility] / [facility]

## 2022-12-15 DIAGNOSIS — F1120 Opioid dependence, uncomplicated: Secondary | ICD-10-CM | POA: Diagnosis not present

## 2022-12-19 ENCOUNTER — Encounter: Payer: Self-pay | Admitting: Nurse Practitioner

## 2022-12-19 ENCOUNTER — Ambulatory Visit: Payer: Medicare HMO | Attending: Nurse Practitioner

## 2022-12-19 DIAGNOSIS — E782 Mixed hyperlipidemia: Secondary | ICD-10-CM | POA: Diagnosis not present

## 2022-12-19 DIAGNOSIS — I10 Essential (primary) hypertension: Secondary | ICD-10-CM | POA: Diagnosis not present

## 2022-12-19 DIAGNOSIS — F1120 Opioid dependence, uncomplicated: Secondary | ICD-10-CM | POA: Diagnosis not present

## 2022-12-20 LAB — CMP14+EGFR
ALT: 20 [IU]/L (ref 0–44)
AST: 26 [IU]/L (ref 0–40)
Albumin: 4.4 g/dL (ref 3.8–4.8)
Alkaline Phosphatase: 91 [IU]/L (ref 44–121)
BUN/Creatinine Ratio: 25 — ABNORMAL HIGH (ref 10–24)
BUN: 18 mg/dL (ref 8–27)
Bilirubin Total: 0.3 mg/dL (ref 0.0–1.2)
CO2: 23 mmol/L (ref 20–29)
Calcium: 9.4 mg/dL (ref 8.6–10.2)
Chloride: 101 mmol/L (ref 96–106)
Creatinine, Ser: 0.71 mg/dL — ABNORMAL LOW (ref 0.76–1.27)
Globulin, Total: 3.1 g/dL (ref 1.5–4.5)
Glucose: 119 mg/dL — ABNORMAL HIGH (ref 70–99)
Potassium: 4.1 mmol/L (ref 3.5–5.2)
Sodium: 139 mmol/L (ref 134–144)
Total Protein: 7.5 g/dL (ref 6.0–8.5)
eGFR: 97 mL/min/{1.73_m2} (ref >59)

## 2022-12-20 LAB — LIPID PANEL
Chol/HDL Ratio: 3 {ratio} (ref 0.0–5.0)
Cholesterol, Total: 137 mg/dL (ref 100–199)
HDL: 46 mg/dL (ref >39)
LDL Chol Calc (NIH): 72 mg/dL (ref 0–99)
Triglycerides: 101 mg/dL (ref 0–149)
VLDL Cholesterol Cal: 19 mg/dL (ref 5–40)

## 2022-12-21 ENCOUNTER — Other Ambulatory Visit: Payer: Self-pay | Admitting: Nurse Practitioner

## 2022-12-21 DIAGNOSIS — R110 Nausea: Secondary | ICD-10-CM

## 2022-12-21 DIAGNOSIS — E118 Type 2 diabetes mellitus with unspecified complications: Secondary | ICD-10-CM

## 2022-12-21 MED ORDER — INSULIN PEN NEEDLE 31G X 5 MM MISC
12 refills | Status: AC
Start: 2022-12-21 — End: ?

## 2022-12-21 MED ORDER — ONDANSETRON HCL 4 MG PO TABS: 4.0000 mg | ORAL_TABLET | Freq: Three times a day (TID) | ORAL | 1 refills | Status: DC | PRN

## 2022-12-29 DIAGNOSIS — F1120 Opioid dependence, uncomplicated: Secondary | ICD-10-CM | POA: Diagnosis not present

## 2023-01-07 ENCOUNTER — Ambulatory Visit: Payer: Medicare HMO

## 2023-01-10 LAB — HM DIABETES EYE EXAM

## 2023-01-12 DIAGNOSIS — F1120 Opioid dependence, uncomplicated: Secondary | ICD-10-CM | POA: Diagnosis not present

## 2023-01-16 ENCOUNTER — Encounter: Payer: Self-pay | Admitting: Nurse Practitioner

## 2023-01-18 ENCOUNTER — Other Ambulatory Visit: Payer: Self-pay | Admitting: Nurse Practitioner

## 2023-01-18 DIAGNOSIS — E119 Type 2 diabetes mellitus without complications: Secondary | ICD-10-CM

## 2023-01-18 MED ORDER — OZEMPIC (0.25 OR 0.5 MG/DOSE) 2 MG/3ML ~~LOC~~ SOPN
0.5000 mg | PEN_INJECTOR | SUBCUTANEOUS | 1 refills | Status: DC
Start: 2023-01-18 — End: 2023-02-13

## 2023-01-22 ENCOUNTER — Telehealth: Payer: Self-pay | Admitting: Neurology

## 2023-01-22 NOTE — Telephone Encounter (Signed)
 MYC confirmation

## 2023-01-23 ENCOUNTER — Ambulatory Visit: Payer: Medicare HMO | Admitting: Neurology

## 2023-01-23 ENCOUNTER — Encounter: Payer: Self-pay | Admitting: Neurology

## 2023-01-26 DIAGNOSIS — F1120 Opioid dependence, uncomplicated: Secondary | ICD-10-CM | POA: Diagnosis not present

## 2023-01-30 DIAGNOSIS — F1120 Opioid dependence, uncomplicated: Secondary | ICD-10-CM | POA: Diagnosis not present

## 2023-02-07 ENCOUNTER — Other Ambulatory Visit: Payer: Self-pay | Admitting: Nurse Practitioner

## 2023-02-07 DIAGNOSIS — E119 Type 2 diabetes mellitus without complications: Secondary | ICD-10-CM

## 2023-02-09 DIAGNOSIS — F1120 Opioid dependence, uncomplicated: Secondary | ICD-10-CM | POA: Diagnosis not present

## 2023-02-12 ENCOUNTER — Other Ambulatory Visit: Payer: Self-pay | Admitting: Nurse Practitioner

## 2023-02-12 DIAGNOSIS — E119 Type 2 diabetes mellitus without complications: Secondary | ICD-10-CM

## 2023-02-13 NOTE — Telephone Encounter (Signed)
Requested medication (s) are due for refill today: na   Requested medication (s) are on the active medication list: yes   Last refill:  01/18/23 #43ml 1 refill  Future visit scheduled: yes in 2 weeks   Notes to clinic:  do you want to refill Rx or wait until next OV?     Requested Prescriptions  Pending Prescriptions Disp Refills   OZEMPIC, 0.25 OR 0.5 MG/DOSE, 2 MG/3ML SOPN [Pharmacy Med Name: Ozempic (0.25 or 0.5 MG/DOSE) 2 MG/3ML Subcutaneous Solution Pen-injector] 3 mL 0    Sig: INJECT  0.25MG  SUBCUTANEOUSLY ONCE A WEEK     Endocrinology:  Diabetes - GLP-1 Receptor Agonists - semaglutide Failed - 02/13/2023  9:20 AM      Failed - HBA1C in normal range and within 180 days    Hemoglobin A1C  Date Value Ref Range Status  11/27/2022 7.7 (A) 4.0 - 5.6 % Final   HbA1c, POC (controlled diabetic range)  Date Value Ref Range Status  05/27/2022 6.6 0.0 - 7.0 % Final   Hgb A1c MFr Bld  Date Value Ref Range Status  08/27/2022 7.6 (H) 4.8 - 5.6 % Final    Comment:             Prediabetes: 5.7 - 6.4          Diabetes: >6.4          Glycemic control for adults with diabetes: <7.0          Failed - Cr in normal range and within 360 days    Creat  Date Value Ref Range Status  08/09/2019 0.77 0.70 - 1.25 mg/dL Final    Comment:    For patients >20 years of age, the reference limit for Creatinine is approximately 13% higher for people identified as African-American. .    Creatinine, Ser  Date Value Ref Range Status  12/19/2022 0.71 (L) 0.76 - 1.27 mg/dL Final         Passed - Valid encounter within last 6 months    Recent Outpatient Visits           2 months ago Diabetes mellitus treated with oral medication (HCC)   [place] [facility] Dept Of [place]. [facility] Claiborne Rigg, NP   5 months ago Primary hypertension   [place] [facility] [place] [facility] Dept Of [place]. [facility] [place], [place] W, NP   8 months ago Controlled type  2 diabetes mellitus with complication, with long-term current use of insulin (HCC)   [place] [facility] Dept Of [place]. [facility] Claiborne Rigg, NP   11 months ago Controlled type 2 diabetes mellitus with complication, with long-term current use of insulin (HCC)   [place] [facility] - [facility] Dept Of [place]. [facility] [place], [place] W, NP   1 year ago Controlled type 2 diabetes mellitus with complication, with long-term current use of insulin (HCC)   [place] [facility] - A Dept Of [place]. [facility] Drucilla Chalet, RPH-CPP       Future Appointments             In 2 weeks Claiborne Rigg, NP [facility] - A Dept Of . [facility]

## 2023-02-20 ENCOUNTER — Other Ambulatory Visit: Payer: Self-pay | Admitting: Nurse Practitioner

## 2023-02-20 DIAGNOSIS — R110 Nausea: Secondary | ICD-10-CM

## 2023-02-20 DIAGNOSIS — F1120 Opioid dependence, uncomplicated: Secondary | ICD-10-CM | POA: Diagnosis not present

## 2023-02-20 DIAGNOSIS — E118 Type 2 diabetes mellitus with unspecified complications: Secondary | ICD-10-CM

## 2023-02-20 NOTE — Telephone Encounter (Signed)
 Requested by interface surescripts. Future visit in 1 week .  Requested Prescriptions  Pending Prescriptions Disp Refills   FARXIGA  10 MG TABS tablet [Pharmacy Med Name: Farxiga  10 MG Oral Tablet] 90 tablet 0    Sig: Take 1 tablet by mouth once daily     Endocrinology:  Diabetes - SGLT2 Inhibitors Failed - 02/20/2023  2:34 PM      Failed - Cr in normal range and within 360 days    Creat  Date Value Ref Range Status  08/09/2019 0.77 0.70 - 1.25 mg/dL Final    Comment:    For patients >55 years of age, the reference limit for Creatinine is approximately 13% higher for people identified as African-American. .    Creatinine, Ser  Date Value Ref Range Status  12/19/2022 0.71 (L) 0.76 - 1.27 mg/dL Final         Passed - HBA1C is between 0 and 7.9 and within 180 days    Hemoglobin A1C  Date Value Ref Range Status  11/27/2022 7.7 (A) 4.0 - 5.6 % Final   HbA1c, POC (controlled diabetic range)  Date Value Ref Range Status  05/27/2022 6.6 0.0 - 7.0 % Final   Hgb A1c MFr Bld  Date Value Ref Range Status  08/27/2022 7.6 (H) 4.8 - 5.6 % Final    Comment:             Prediabetes: 5.7 - 6.4          Diabetes: >6.4          Glycemic control for adults with diabetes: <7.0          Passed - eGFR in normal range and within 360 days    GFR, Est African American  Date Value Ref Range Status  06/22/2013 >89 mL/min Final   GFR calc Af Amer  Date Value Ref Range Status  02/23/2020 109 >59 mL/min/1.73 Final    Comment:    **In accordance with recommendations from the NKF-ASN Task force,**   [facility] is in the process of updating its eGFR calculation to the   2021 CKD-EPI creatinine equation that estimates kidney function   without a race variable.    GFR, Est Non African American  Date Value Ref Range Status  06/22/2013 >89 mL/min Final    Comment:      The estimated GFR is a calculation valid for adults (>=8 years old) that uses the CKD-EPI algorithm to adjust for age and sex. It  is   not to be used for children, pregnant women, hospitalized patients,    patients on dialysis, or with rapidly changing kidney function. According to the NKDEP, eGFR >89 is normal, 60-89 shows mild impairment, 30-59 shows moderate impairment, 15-29 shows severe impairment and <15 is ESRD.     GFR, Estimated  Date Value Ref Range Status  03/30/2022 >60 >60 mL/min Final    Comment:    (NOTE) Calculated using the CKD-EPI Creatinine Equation (2021)    GFR  Date Value Ref Range Status  03/28/2017 115.83 >60.00 mL/min Final   eGFR  Date Value Ref Range Status  12/19/2022 97 >59 mL/min/1.73 Final         Passed - Valid encounter within last 6 months    Recent Outpatient Visits           2 months ago Diabetes mellitus treated with oral medication (HCC)   [place] [facility] Dept Of [place]. [facility] [place], Zelda  W, NP   5 months ago Primary hypertension   [place] [facility] [place] [facility] Dept Of [place]. [facility] [place], [place] W, NP   8 months ago Controlled type 2 diabetes mellitus with complication, with long-term current use of insulin  [facility])   [place] [facility] - [facility] Dept Of [place]. [facility] Theotis Haze ORN, NP   11 months ago Controlled type 2 diabetes mellitus with complication, with long-term current use of insulin  [facility])   [place] [facility] - A Dept Of [place]. [facility] [place], [place] W, NP   1 year ago Controlled type 2 diabetes mellitus with complication, with long-term current use of insulin  [facility])   [place] [facility] - A Dept Of [place]. [facility] [place] Tonia Garnette LITTIE, RPH-CPP       Future Appointments             In 1 week Theotis Haze ORN, NP [facility] - A Dept Of [place]. [facility]             ondansetron  (ZOFRAN ) 4 MG tablet [Pharmacy Med Name: Ondansetron  HCl 4 MG Oral Tablet] 20 tablet 0     Sig: TAKE 1 TABLET BY MOUTH EVERY 8 HOURS AS NEEDED FOR NAUSEA OR VOMITING     Not Delegated - Gastroenterology: Antiemetics - ondansetron  Failed - 02/20/2023  2:34 PM      Failed - This refill cannot be delegated      Passed - AST in normal range and within 360 days    AST  Date Value Ref Range Status  12/19/2022 26 0 - 40 IU/L Final         Passed - ALT in normal range and within 360 days    ALT  Date Value Ref Range Status  12/19/2022 20 0 - 44 IU/L Final  06/22/2019 84 (H) 9 - 46 U/L Final         Passed - Valid encounter within last 6 months    Recent Outpatient Visits           2 months ago Diabetes mellitus treated with oral medication (HCC)   [place] [facility] Dept Of [place]. [facility] Theotis Haze ORN, NP   5 months ago Primary hypertension   [place] [facility] [place] - A Dept Of [place]. [facility] [place], [place] W, NP   8 months ago Controlled type 2 diabetes mellitus with complication, with long-term current use of insulin  [facility])   [place] [facility] - A Dept Of [place]. [facility] Theotis Haze ORN, NP   11 months ago Controlled type 2 diabetes mellitus with complication, with long-term current use of insulin  [facility])    [facility] - A Dept Of [place]. [facility] [place], [place] W, NP   1 year ago Controlled type 2 diabetes mellitus with complication, with long-term current use of insulin  [facility])    [facility] - A Dept Of [place]. [facility] [place] Tonia Garnette LITTIE, RPH-CPP       Future Appointments             In 1 week Theotis Haze ORN, NP [facility] - A Dept Of [place]. [facility]

## 2023-02-20 NOTE — Telephone Encounter (Signed)
 Requested medication (s) are due for refill today: yes   Requested medication (s) are on the active medication list: yes   Last refill:  12/21/22 #20 1 refills   Future visit scheduled: yes in 1 week   Notes to clinic:  not delegated per protocol . Do you want to refill Rx?     Requested Prescriptions  Pending Prescriptions Disp Refills   ondansetron  (ZOFRAN ) 4 MG tablet [Pharmacy Med Name: Ondansetron  HCl 4 MG Oral Tablet] 20 tablet 0    Sig: TAKE 1 TABLET BY MOUTH EVERY 8 HOURS AS NEEDED FOR NAUSEA OR VOMITING     Not Delegated - Gastroenterology: Antiemetics - ondansetron  Failed - 02/20/2023  2:34 PM      Failed - This refill cannot be delegated      Passed - AST in normal range and within 360 days    AST  Date Value Ref Range Status  12/19/2022 26 0 - 40 IU/L Final         Passed - ALT in normal range and within 360 days    ALT  Date Value Ref Range Status  12/19/2022 20 0 - 44 IU/L Final  06/22/2019 84 (H) 9 - 46 U/L Final         Passed - Valid encounter within last 6 months    Recent Outpatient Visits           2 months ago Diabetes mellitus treated with oral medication (HCC)   [place] [facility] Dept Of [place]. [facility] Theotis Haze ORN, NP   5 months ago Primary hypertension   [place] [facility] [place] [facility] Of [place]. [facility] [place], [place] W, NP   8 months ago Controlled type 2 diabetes mellitus with complication, with long-term current use of insulin  [facility])   [place] [facility] Dept Of [place]. [facility] Theotis Haze ORN, NP   11 months ago Controlled type 2 diabetes mellitus with complication, with long-term current use of insulin  [facility])   [place] [facility] - [facility] Dept Of [place]. [facility] [place], [place] W, NP   1 year ago Controlled type 2 diabetes mellitus with complication, with long-term current use of insulin  [facility])   [place] [facility] - A Dept Of [place]. [facility] [place] Tonia Garnette LITTIE, RPH-CPP       Future Appointments             In 1 week Theotis Haze ORN, NP [facility] - A Dept Of [place]. [facility]            Signed Prescriptions Disp Refills   FARXIGA  10 MG TABS tablet 90 tablet 0    Sig: Take 1 tablet by mouth once daily     Endocrinology:  Diabetes - SGLT2 Inhibitors Failed - 02/20/2023  2:34 PM      Failed - Cr in normal range and within 360 days    Creat  Date Value Ref Range Status  08/09/2019 0.77 0.70 - 1.25 mg/dL Final    Comment:    For patients >46 years of age, the reference limit for Creatinine is approximately 13% higher for people identified as African-American. .    Creatinine, Ser  Date Value Ref Range Status  12/19/2022 0.71 (L) 0.76 - 1.27 mg/dL Final  Passed - HBA1C is between 0 and 7.9 and within 180 days    Hemoglobin A1C  Date Value Ref Range Status  11/27/2022 7.7 (A) 4.0 - 5.6 % Final   HbA1c, POC (controlled diabetic range)  Date Value Ref Range Status  05/27/2022 6.6 0.0 - 7.0 % Final   Hgb A1c MFr Bld  Date Value Ref Range Status  08/27/2022 7.6 (H) 4.8 - 5.6 % Final    Comment:             Prediabetes: 5.7 - 6.4          Diabetes: >6.4          Glycemic control for adults with diabetes: <7.0          Passed - eGFR in normal range and within 360 days    GFR, Est African American  Date Value Ref Range Status  06/22/2013 >89 mL/min Final   GFR calc Af Amer  Date Value Ref Range Status  02/23/2020 109 >59 mL/min/1.73 Final    Comment:    **In accordance with recommendations from the NKF-ASN Task force,**   [facility] is in the process of updating its eGFR calculation to the   2021 CKD-EPI creatinine equation that estimates kidney function   without a race variable.    GFR, Est Non African American  Date Value Ref Range Status  06/22/2013 >89 mL/min Final    Comment:      The estimated  GFR is a calculation valid for adults (>=82 years old) that uses the CKD-EPI algorithm to adjust for age and sex. It is   not to be used for children, pregnant women, hospitalized patients,    patients on dialysis, or with rapidly changing kidney function. According to the NKDEP, eGFR >89 is normal, 60-89 shows mild impairment, 30-59 shows moderate impairment, 15-29 shows severe impairment and <15 is ESRD.     GFR, Estimated  Date Value Ref Range Status  03/30/2022 >60 >60 mL/min Final    Comment:    (NOTE) Calculated using the CKD-EPI Creatinine Equation (2021)    GFR  Date Value Ref Range Status  03/28/2017 115.83 >60.00 mL/min Final   eGFR  Date Value Ref Range Status  12/19/2022 97 >59 mL/min/1.73 Final         Passed - Valid encounter within last 6 months    Recent Outpatient Visits           2 months ago Diabetes mellitus treated with oral medication (HCC)   [place] [facility] Dept Of [place]. [facility] Theotis Haze ORN, NP   5 months ago Primary hypertension   [place] [facility] [place] [facility] Dept Of [place]. [facility] [place], [place] W, NP   8 months ago Controlled type 2 diabetes mellitus with complication, with long-term current use of insulin  [facility])   [place] [facility] - A Dept Of [place]. [facility] Theotis Haze ORN, NP   11 months ago Controlled type 2 diabetes mellitus with complication, with long-term current use of insulin  [facility])   [place] [facility] - A Dept Of [place]. [facility] [place], [place] W, NP   1 year ago Controlled type 2 diabetes mellitus with complication, with long-term current use of insulin  [facility])   [place] [facility] - A Dept Of . [facility] Fle[place] Tonia Garnette LITTIE, RPH-CPP  Future Appointments             In 1 week Theotis Haze ORN, NP [place] [facility] - [facility] Dept Of [place]. [facility]

## 2023-02-23 ENCOUNTER — Other Ambulatory Visit: Payer: Self-pay | Admitting: Podiatry

## 2023-02-23 DIAGNOSIS — F1120 Opioid dependence, uncomplicated: Secondary | ICD-10-CM | POA: Diagnosis not present

## 2023-03-02 ENCOUNTER — Other Ambulatory Visit: Payer: Self-pay | Admitting: Nurse Practitioner

## 2023-03-02 DIAGNOSIS — E119 Type 2 diabetes mellitus without complications: Secondary | ICD-10-CM

## 2023-03-04 ENCOUNTER — Ambulatory Visit: Payer: Medicare HMO | Attending: Nurse Practitioner | Admitting: Nurse Practitioner

## 2023-03-04 ENCOUNTER — Encounter: Payer: Self-pay | Admitting: Nurse Practitioner

## 2023-03-04 VITALS — BP 132/84 | HR 91 | Resp 20 | Ht 71.0 in | Wt 207.8 lb

## 2023-03-04 DIAGNOSIS — Z794 Long term (current) use of insulin: Secondary | ICD-10-CM

## 2023-03-04 DIAGNOSIS — J984 Other disorders of lung: Secondary | ICD-10-CM | POA: Diagnosis not present

## 2023-03-04 DIAGNOSIS — E118 Type 2 diabetes mellitus with unspecified complications: Secondary | ICD-10-CM | POA: Diagnosis not present

## 2023-03-04 DIAGNOSIS — E1142 Type 2 diabetes mellitus with diabetic polyneuropathy: Secondary | ICD-10-CM | POA: Diagnosis not present

## 2023-03-04 DIAGNOSIS — R110 Nausea: Secondary | ICD-10-CM

## 2023-03-04 LAB — POCT GLYCOSYLATED HEMOGLOBIN (HGB A1C): Hemoglobin A1C: 7.1 % — AB (ref 4.0–5.6)

## 2023-03-04 MED ORDER — ONDANSETRON HCL 4 MG PO TABS
4.0000 mg | ORAL_TABLET | Freq: Three times a day (TID) | ORAL | 1 refills | Status: AC | PRN
Start: 2023-03-04 — End: ?

## 2023-03-04 MED ORDER — GABAPENTIN 600 MG PO TABS: 600.0000 mg | ORAL_TABLET | Freq: Three times a day (TID) | ORAL | 3 refills | Status: DC

## 2023-03-04 MED ORDER — ALBUTEROL SULFATE HFA 108 (90 BASE) MCG/ACT IN AERS
2.0000 | INHALATION_SPRAY | Freq: Four times a day (QID) | RESPIRATORY_TRACT | 0 refills | Status: DC | PRN
Start: 2023-03-04 — End: 2023-06-02

## 2023-03-04 NOTE — Progress Notes (Unsigned)
Assessment & Plan:  Phillip Masood "Max" was seen today for medical management of chronic issues.  Diagnoses and all orders for this visit:  Controlled type 2 diabetes mellitus with complication, with long-term current use of insulin (HCC) -     POCT glycosylated hemoglobin (Hb A1C) Continue blood sugar control as discussed in office today, low carbohydrate diet, and regular physical exercise as tolerated, 150 minutes per week (30 min each day, 5 days per week, or 50 min 3 days per week). Keep blood sugar logs with fasting goal of 90-130 mg/dl, post prandial (after you eat) less than 180.  For Hypoglycemia: BS <60 and Hyperglycemia BS >400; contact the clinic ASAP. Annual eye exams and foot exams are recommended.   Diabetic polyneuropathy associated with type 2 diabetes mellitus  DC lyrica. He does not believe it has been helping relieve his neuropathic pain.  -     gabapentin (NEURONTIN) 600 MG tablet; Take 1 tablet (600 mg total) by mouth 3 (three) times daily.  Nausea Notes nausea with taking ozempic.  -     ondansetron (ZOFRAN) 4 MG tablet; Take 1 tablet (4 mg total) by mouth every 8 (eight) hours as needed.  Restrictive airway disease He continues to smoke cigarettes.  -     albuterol (VENTOLIN HFA) 108 (90 Base) MCG/ACT inhaler; Inhale 2 puffs into the lungs every 6 (six) hours as needed for wheezing or shortness of breath.    Patient has been counseled on age-appropriate routine health concerns for screening and prevention. These are reviewed and up-to-date. Referrals have been placed accordingly. Immunizations are up-to-date or declined.    Subjective:   Chief Complaint  Patient presents with   Medical Management of Chronic Issues    Phillip Paul 73 y.o. male presents to office today for follow up to DM and HTN  He has a past medical history of Abnormal EKG, Anxiety, Chest pain, DM2, diabetic peripheral neuropathy,  Hypertension, Kidney stones, and Racing  heart beat.   HTN Blood pressure at goal today. He is taking amlodipine 10 mg daily and losartan 100 mg daily as prescribed.  BP Readings from Last 3 Encounters:  03/04/23 132/84  11/27/22 126/69  08/27/22 (!) 147/82     DM 2 A1c trending down and near goal of <7.0. Currently at 7.1. he has been taking over the counter herbs including milk thistle and chicory root to help lower his blood pressure and blood sugars. Currently prescribed farxiga 10 mg daily, basaglar 10 units daily, ozempic 0.5 mg weekly. Does not feel lyrica is helping to relieve his neuropathy. Wants to revisit taking gabapentin again.  Lab Results  Component Value Date   HGBA1C 7.1 (A) 03/04/2023    Review of Systems  Constitutional:  Negative for fever, malaise/fatigue and weight loss.  HENT: Negative.  Negative for nosebleeds.   Eyes: Negative.  Negative for blurred vision, double vision and photophobia.  Respiratory: Negative.  Negative for cough and shortness of breath.   Cardiovascular: Negative.  Negative for chest pain, palpitations and leg swelling.  Gastrointestinal: Negative.  Negative for heartburn, nausea and vomiting.  Musculoskeletal: Negative.  Negative for myalgias.  Neurological:  Positive for tingling and sensory change. Negative for dizziness, focal weakness, seizures and headaches.  Psychiatric/Behavioral: Negative.  Negative for suicidal ideas.     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)    Diabetes mellitus without complication (HCC)    Dizzy    Hypertension    Kidney stones    Racing heart beat     Past Surgical History:  Procedure Laterality Date   CYSTOSCOPY     LEFT URETEROSCOPIC STONE MANIPULATION AND REMOVAL; PLACEMENT OF  LEFT DOUBLE-J URETERAL STENT   CYSTOSCOPY W/ URETERAL STENT PLACEMENT     LEFT DOUBLE-J URETERAL STENT   ORIF ANKLE FRACTURE Left 08/17/2012   Procedure: OPEN REDUCTION INTERNAL FIXATION (ORIF) ANKLE FRACTURE/Left;  Surgeon: Toni Arthurs, MD;  Location: [facility] OR;  Service: Orthopedics;  Laterality: Left;   TIBIA IM NAIL INSERTION Left 08/17/2012   Procedure: INTRAMEDULLARY (IM) NAIL TIBIAL/Left;  Surgeon: Toni Arthurs, MD;  Location: [facility] OR;  Service: Orthopedics;  Laterality: Left;    Family History  Problem Relation Age of Onset   Hypertension Father        lived to 69   Other Brother        MVA   Kidney disease Sister    Other Brother        Stomach problems    Social History Reviewed with no changes to be made today.   Outpatient Medications Prior to Visit  Medication Sig Dispense Refill   Accu-Chek Softclix Lancets lancets Use to check blood sugar 3 times daily. E11.69 100 each 6   amLODipine (NORVASC) 10 MG tablet Take 1 tablet (10 mg total) by mouth daily. 90 tablet 1   aspirin EC 81 MG tablet Take 1 tablet (81 mg total) by mouth daily. Swallow whole. 90 tablet 3   Blood Glucose Monitoring Suppl (ONETOUCH VERIO FLEX SYSTEM) w/Device KIT USE AS DIRECTED THREE TIMES DAILY TO CHECK GLUCOSE 1 kit 0   busPIRone (BUSPAR) 15 MG tablet Take 1 tablet (15 mg total) by mouth 2 (two) times daily. 180 tablet 1   cholecalciferol (VITAMIN D) 1000 units tablet Take 1,000 Units by mouth daily.     COENZYME Q10 PO Take by mouth.     FARXIGA 10 MG TABS tablet Take 1 tablet by mouth once daily 90 tablet 0   fish oil-omega-3 fatty acids 1000 MG capsule Take 1 g by mouth daily.     fluticasone (FLONASE) 50 MCG/ACT nasal  spray Place 2 sprays into both nostrils daily. 48 g 1   glucose blood (ACCU-CHEK GUIDE) test strip Use to check blood sugar 3 times daily. E11.69 100 each 6   Insulin Glargine (BASAGLAR KWIKPEN) 100 UNIT/ML Inject 10 Units into the skin daily. 15 mL 2   Insulin Pen Needle 31G X 5 MM MISC Use as instructed. 100 each 12   losartan (COZAAR) 100 MG tablet Take 1 tablet (100 mg total) by mouth daily. 90 tablet 1   NON FORMULARY [facility]  Anti-fungal (nail)-#1     omeprazole (PRILOSEC) 40 MG capsule Take 1 capsule (40 mg total) by mouth daily. 90 capsule 3  rosuvastatin (CRESTOR) 20 MG tablet Take 1 tablet (20 mg total) by mouth daily. 90 tablet 3   Semaglutide,0.25 or 0.5MG /DOS, (OZEMPIC, 0.25 OR 0.5 MG/DOSE,) 2 MG/3ML SOPN Inject 0.5 mg into the skin once a week. 3 mL 0   Spacer/Aero-Holding [employer] approved spacer. ICD 10 J42 1 Units 0   albuterol (VENTOLIN HFA) 108 (90 Base) MCG/ACT inhaler Inhale 2 puffs into the lungs every 6 (six) hours as needed for wheezing or shortness of breath. 8 g 2   ondansetron (ZOFRAN) 4 MG tablet TAKE 1 TABLET BY MOUTH EVERY 8 HOURS AS NEEDED FOR NAUSEA OR VOMITING 20 tablet 0   pregabalin (LYRICA) 100 MG capsule Take 1 capsule by mouth twice daily 60 capsule 0   pregabalin (LYRICA) 100 MG capsule Take 1 capsule (100 mg total) by mouth 2 (two) times daily. (Patient not taking: Reported on 03/04/2023) 60 capsule 2   No facility-administered medications prior to visit.    Allergies  Allergen Reactions   Tylenol [Acetaminophen] Other (See Comments)    ELEVATED LFTs       Objective:    BP 132/84 (BP Location: Left Arm, Patient Position: Sitting, Cuff Size: Normal)   Pulse 91   Resp 20   Ht 5\' 11"  (1.803 m)   Wt 207 lb 12.8 oz (94.3 kg)   SpO2 95%   BMI 28.98 kg/m  Wt Readings from Last 3 Encounters:  03/04/23 207 lb 12.8 oz (94.3 kg)  11/27/22 213 lb 6.4 oz (96.8 kg)  08/27/22 211 lb 9.6 oz (96 kg)    Physical  Exam Vitals and nursing note reviewed.  Constitutional:      Appearance: He is well-developed.  HENT:     Head: Normocephalic and atraumatic.  Cardiovascular:     Rate and Rhythm: Normal rate and regular rhythm.     Heart sounds: Normal heart sounds. No murmur heard.    No friction rub. No gallop.  Pulmonary:     Effort: Pulmonary effort is normal. No tachypnea or respiratory distress.     Breath sounds: Normal breath sounds. No decreased breath sounds, wheezing, rhonchi or rales.  Chest:     Chest wall: No tenderness.  Abdominal:     General: Bowel sounds are normal.     Palpations: Abdomen is soft.  Musculoskeletal:        General: Normal range of motion.     Cervical back: Normal range of motion.  Skin:    General: Skin is warm and dry.  Neurological:     Mental Status: He is alert and oriented to person, place, and time.     Coordination: Coordination normal.  Psychiatric:        Behavior: Behavior normal. Behavior is cooperative.        Thought Content: Thought content normal.        Judgment: Judgment normal.          Patient has been counseled extensively about nutrition and exercise as well as the importance of adherence with medications and regular follow-up. The patient was given clear instructions to go to ER or return to medical center if symptoms don't improve, worsen or new problems develop. The patient verbalized understanding.   Follow-up: Return in about 3 months (around 06/01/2023).   Claiborne Rigg, FNP-BC [facility] [place], [place] [phone]   03/06/2023, 6:26 PM

## 2023-03-06 ENCOUNTER — Encounter: Payer: Self-pay | Admitting: Nurse Practitioner

## 2023-03-09 DIAGNOSIS — F1120 Opioid dependence, uncomplicated: Secondary | ICD-10-CM | POA: Diagnosis not present

## 2023-03-12 ENCOUNTER — Other Ambulatory Visit: Payer: Self-pay | Admitting: Nurse Practitioner

## 2023-03-12 DIAGNOSIS — I10 Essential (primary) hypertension: Secondary | ICD-10-CM

## 2023-03-23 DIAGNOSIS — F1120 Opioid dependence, uncomplicated: Secondary | ICD-10-CM | POA: Diagnosis not present

## 2023-03-27 ENCOUNTER — Encounter: Payer: Self-pay | Admitting: Nurse Practitioner

## 2023-03-27 ENCOUNTER — Other Ambulatory Visit: Payer: Self-pay | Admitting: Nurse Practitioner

## 2023-03-27 DIAGNOSIS — E118 Type 2 diabetes mellitus with unspecified complications: Secondary | ICD-10-CM

## 2023-03-27 DIAGNOSIS — F1120 Opioid dependence, uncomplicated: Secondary | ICD-10-CM | POA: Diagnosis not present

## 2023-03-27 MED ORDER — SEMAGLUTIDE (1 MG/DOSE) 4 MG/3ML ~~LOC~~ SOPN: 1.0000 mg | PEN_INJECTOR | SUBCUTANEOUS | 1 refills | Status: DC

## 2023-04-04 ENCOUNTER — Other Ambulatory Visit: Payer: Self-pay | Admitting: Nurse Practitioner

## 2023-04-04 DIAGNOSIS — Z794 Long term (current) use of insulin: Secondary | ICD-10-CM

## 2023-04-04 DIAGNOSIS — F419 Anxiety disorder, unspecified: Secondary | ICD-10-CM

## 2023-04-04 MED ORDER — SEMAGLUTIDE (2 MG/DOSE) 8 MG/3ML ~~LOC~~ SOPN
2.0000 mg | PEN_INJECTOR | SUBCUTANEOUS | 3 refills | Status: DC
Start: 2023-04-04 — End: 2023-07-16

## 2023-04-04 MED ORDER — BUSPIRONE HCL 30 MG PO TABS: 30.0000 mg | ORAL_TABLET | Freq: Two times a day (BID) | ORAL | 3 refills | Status: DC

## 2023-04-06 DIAGNOSIS — F1120 Opioid dependence, uncomplicated: Secondary | ICD-10-CM | POA: Diagnosis not present

## 2023-04-20 DIAGNOSIS — F1120 Opioid dependence, uncomplicated: Secondary | ICD-10-CM | POA: Diagnosis not present

## 2023-04-24 DIAGNOSIS — F1120 Opioid dependence, uncomplicated: Secondary | ICD-10-CM | POA: Diagnosis not present

## 2023-05-04 DIAGNOSIS — F1120 Opioid dependence, uncomplicated: Secondary | ICD-10-CM | POA: Diagnosis not present

## 2023-05-05 ENCOUNTER — Other Ambulatory Visit: Payer: Self-pay | Admitting: Nurse Practitioner

## 2023-05-05 DIAGNOSIS — I10 Essential (primary) hypertension: Secondary | ICD-10-CM

## 2023-05-18 DIAGNOSIS — F1120 Opioid dependence, uncomplicated: Secondary | ICD-10-CM | POA: Diagnosis not present

## 2023-05-19 ENCOUNTER — Other Ambulatory Visit: Payer: Self-pay | Admitting: Nurse Practitioner

## 2023-05-19 DIAGNOSIS — E118 Type 2 diabetes mellitus with unspecified complications: Secondary | ICD-10-CM

## 2023-05-22 DIAGNOSIS — F1120 Opioid dependence, uncomplicated: Secondary | ICD-10-CM | POA: Diagnosis not present

## 2023-06-01 DIAGNOSIS — F1120 Opioid dependence, uncomplicated: Secondary | ICD-10-CM | POA: Diagnosis not present

## 2023-06-02 ENCOUNTER — Encounter: Payer: Self-pay | Admitting: Nurse Practitioner

## 2023-06-02 ENCOUNTER — Ambulatory Visit: Payer: Medicare HMO | Attending: Nurse Practitioner | Admitting: Nurse Practitioner

## 2023-06-02 VITALS — BP 143/75 | HR 74 | Resp 19 | Ht 71.0 in | Wt 195.8 lb

## 2023-06-02 DIAGNOSIS — J984 Other disorders of lung: Secondary | ICD-10-CM | POA: Diagnosis not present

## 2023-06-02 DIAGNOSIS — E118 Type 2 diabetes mellitus with unspecified complications: Secondary | ICD-10-CM | POA: Diagnosis not present

## 2023-06-02 DIAGNOSIS — Z794 Long term (current) use of insulin: Secondary | ICD-10-CM

## 2023-06-02 DIAGNOSIS — I10 Essential (primary) hypertension: Secondary | ICD-10-CM

## 2023-06-02 DIAGNOSIS — R42 Dizziness and giddiness: Secondary | ICD-10-CM | POA: Diagnosis not present

## 2023-06-02 LAB — POCT GLYCOSYLATED HEMOGLOBIN (HGB A1C): HbA1c, POC (controlled diabetic range): 6.6 % (ref 0.0–7.0)

## 2023-06-02 MED ORDER — AMLODIPINE BESYLATE 10 MG PO TABS: 10.0000 mg | ORAL_TABLET | Freq: Every day | ORAL | 1 refills | Status: DC

## 2023-06-02 MED ORDER — FLUTICASONE PROPIONATE 50 MCG/ACT NA SUSP: 2.0000 | Freq: Every day | NASAL | 1 refills | Status: AC

## 2023-06-02 MED ORDER — ALBUTEROL SULFATE HFA 108 (90 BASE) MCG/ACT IN AERS: 2.0000 | INHALATION_SPRAY | Freq: Four times a day (QID) | RESPIRATORY_TRACT | 0 refills | Status: AC | PRN

## 2023-06-02 MED ORDER — FARXIGA 10 MG PO TABS: 10.0000 mg | ORAL_TABLET | Freq: Every day | ORAL | 1 refills | Status: AC

## 2023-06-02 NOTE — Progress Notes (Signed)
 Assessment & Plan:   Phillip Masood "Max" was seen today for diabetes and hypertension.  Diagnoses and all orders for this visit:  Controlled type 2 diabetes mellitus with complication, with long-term current use of insulin   -     POCT glycosylated hemoglobin (Hb A1C) -     FARXIGA  10 MG TABS tablet; Take 1 tablet (10 mg total) by mouth daily. -     CMP14+EGFR  Primary hypertension No changes made with current medication regimen -     amLODipine  (NORVASC ) 10 MG tablet; Take 1 tablet (10 mg total) by mouth daily. -     CMP14+EGFR Continue all antihypertensives as prescribed.  Reminded to bring in blood pressure log for follow  up appointment.  RECOMMENDATIONS: DASH/Mediterranean Diets are healthier choices for HTN.    Restrictive airway disease Symptoms well-controlled -     albuterol  (VENTOLIN  HFA) 108 (90 Base) MCG/ACT inhaler; Inhale 2 puffs into the lungs every 6 (six) hours as needed for wheezing or shortness of breath.  Vertigo Currently resolved -     fluticasone  (FLONASE ) 50 MCG/ACT nasal spray; Place 2 sprays into both nostrils daily.    Patient has been counseled on age-appropriate routine health concerns for screening and prevention. These are reviewed and up-to-date. Referrals have been placed accordingly. Immunizations are up-to-date or declined.    Subjective:   Chief Complaint  Patient presents with   Diabetes   Hypertension    Phillip Paul 73 y.o. male presents to office today for follow-up of hypertension and diabetes.  He has a past medical history of Abnormal EKG, Anxiety, Chest pain, DM2, diabetic peripheral neuropathy,  Hypertension, Kidney stones, and Racing heart beat.   He has a very small umbilical hernia present.  He endorses infrequent mild pain in this area.  Based on exam today this would not require any surgical intervention at this time.    HTN Blood pressure is slightly above goal today.  He is currently prescribed  amlodipine  10 mg daily and losartan  100 mg daily.  He states blood pressure may be up due to him having trouble finding parking prior to arriving for his visit. BP Readings from Last 3 Encounters:  06/02/23 (!) 143/75  03/04/23 132/84  11/27/22 126/69     DM 2 A1c is at goal and down from 7.1.  Weight is trending down nicely as well.  He is lost 12 pounds since his last office visit.  Managing diabetes with semaglutide  2 mg weekly, Farxiga  10 mg daily and Basaglar  10 units daily Lab Results  Component Value Date   HGBA1C 6.6 06/02/2023    His bowels are moving now  Review of Systems  Constitutional:  Negative for fever, malaise/fatigue and weight loss.  HENT:  Positive for congestion. Negative for nosebleeds.   Eyes: Negative.  Negative for blurred vision, double vision and photophobia.  Respiratory: Negative.  Negative for cough and shortness of breath.   Cardiovascular: Negative.  Negative for chest pain, palpitations and leg swelling.  Gastrointestinal: Negative.  Negative for heartburn, nausea and vomiting.  Musculoskeletal: Negative.  Negative for myalgias.  Neurological: Negative.  Negative for dizziness, focal weakness, seizures and headaches.  Psychiatric/Behavioral: Negative.  Negative for suicidal ideas.     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)    Diabetes mellitus without complication (HCC)    Dizzy    Hypertension    Kidney stones     Racing heart beat     Past Surgical History:  Procedure Laterality Date   CYSTOSCOPY     LEFT URETEROSCOPIC STONE MANIPULATION AND REMOVAL; PLACEMENT OF  LEFT DOUBLE-J URETERAL STENT   CYSTOSCOPY W/ URETERAL STENT PLACEMENT     LEFT DOUBLE-J URETERAL STENT   ORIF ANKLE FRACTURE Left 08/17/2012   Procedure: OPEN REDUCTION INTERNAL FIXATION (ORIF) ANKLE FRACTURE/Left;  Surgeon: Amada Backer, MD;  [facility];  Service: Orthopedics;  Laterality: Left;   TIBIA IM NAIL INSERTION Left 08/17/2012   Procedure: INTRAMEDULLARY (IM) NAIL TIBIAL/Left;  Surgeon: Amada Backer, MD;  [facility];  Service: Orthopedics;  Laterality: Left;    Family History  Problem Relation Age of Onset   Hypertension Father        lived to 38   Other Brother        MVA   Kidney disease Sister    Other Brother        Stomach problems    Social History Reviewed with no changes to be made today.   Outpatient Medications Prior to Visit  Medication Sig Dispense Refill   Accu-Chek Softclix Lancets lancets Use to check blood sugar 3 times daily. E11.69 100 each 6   aspirin  EC 81 MG tablet Take 1 tablet (81 mg total) by mouth daily. Swallow whole. 90 tablet 3   Blood Glucose Monitoring Suppl (ONETOUCH VERIO FLEX SYSTEM) w/Device KIT USE AS DIRECTED THREE TIMES DAILY TO CHECK GLUCOSE 1 kit 0   busPIRone  (BUSPAR ) 30 MG tablet Take 1 tablet (30 mg total) by mouth 2 (two) times daily. 60 tablet 3   cholecalciferol (VITAMIN D ) 1000 units tablet Take 1,000 Units by mouth daily.     COENZYME Q10 PO Take by mouth.     gabapentin  (NEURONTIN ) 600 MG tablet Take 1 tablet (600 mg total) by mouth 3 (three) times daily. 90 tablet 3   glucose blood (ACCU-CHEK GUIDE) test strip Use to check blood sugar 3 times daily. E11.69 100 each 6   Insulin  Glargine (BASAGLAR  KWIKPEN) 100 UNIT/ML Inject 10 Units into the skin daily. 15 mL 2   Insulin  Pen Needle 31G X 5 MM MISC Use as instructed. 100 each 12   losartan  (COZAAR ) 100 MG  tablet Take 1 tablet by mouth once daily 90 tablet 1   NON FORMULARY [facility]  Anti-fungal (nail)-#1     omeprazole  (PRILOSEC) 40 MG capsule Take 1 capsule (40 mg total) by mouth daily. 90 capsule 3   ondansetron  (ZOFRAN ) 4 MG tablet Take 1 tablet (4 mg total) by mouth every 8 (eight) hours as needed. 20 tablet 1   rosuvastatin  (CRESTOR ) 20 MG tablet Take 1 tablet (20 mg total) by mouth daily. 90 tablet 3   Semaglutide , 2 MG/DOSE, 8 MG/3ML SOPN Inject 2 mg as directed once a week. 3 mL 3   Spacer/Aero-Holding [employer]  DEVI Administer insurance approved spacer. ICD 10 J42 1 Units 0   albuterol  (VENTOLIN  HFA) 108 (90 Base) MCG/ACT inhaler Inhale 2 puffs into the lungs every 6 (six) hours as needed for wheezing or shortness of breath. 8 g 0   amLODipine  (NORVASC ) 10 MG tablet Take 1 tablet by mouth once daily 30 tablet 0   FARXIGA  10 MG TABS tablet Take 1 tablet by mouth once daily 90 tablet 0   fluticasone  (FLONASE ) 50 MCG/ACT nasal spray Place 2 sprays into both nostrils daily. 48 g 1   fish oil-omega-3 fatty acids 1000 MG capsule Take 1 g by mouth daily. (Patient not taking: Reported on 06/02/2023)     No facility-administered medications prior to visit.    Allergies  Allergen Reactions   Tylenol  [Acetaminophen ] Other (See Comments)    ELEVATED LFTs       Objective:    BP (!) 143/75 (BP Location: Left Arm, Patient Position: Sitting, Cuff Size: Normal)   Pulse 74   Resp 19   Ht 5\' 11"  (1.803 m)   Wt 195 lb 12.8 oz (88.8 kg)   SpO2 100%   BMI 27.31 kg/m  Wt Readings from Last 3 Encounters:  06/02/23 195 lb 12.8 oz (88.8 kg)  03/04/23 207 lb 12.8 oz (94.3 kg)  11/27/22 213 lb 6.4 oz (96.8 kg)    Physical Exam Vitals and nursing note reviewed.  Constitutional:      Appearance: He is well-developed.  HENT:     Head: Normocephalic and atraumatic.  Cardiovascular:     Rate and Rhythm: Normal rate and regular rhythm.     Heart sounds: Normal heart sounds. No murmur  heard.    No friction rub. No gallop.  Pulmonary:     Effort: Pulmonary effort is normal. No tachypnea or respiratory distress.     Breath sounds: Normal breath sounds. No decreased breath sounds, wheezing, rhonchi or rales.  Chest:     Chest wall: No tenderness.  Abdominal:     General: Bowel sounds are normal.     Palpations: Abdomen is soft.  Musculoskeletal:        General: Normal range of motion.     Cervical back: Normal range of motion.  Skin:    General: Skin is warm and dry.  Neurological:     Mental Status: He is alert and oriented to person, place, and time.     Coordination: Coordination normal.  Psychiatric:        Behavior: Behavior normal. Behavior is cooperative.        Thought Content: Thought content normal.        Judgment: Judgment normal.          Patient has been counseled extensively about nutrition and exercise as well as the importance of adherence with medications and regular follow-up. The patient was given clear instructions to go to ER or return to medical center if symptoms don't improve, worsen or new problems develop. The patient verbalized understanding.   Follow-up: Return in about 14 weeks (around 09/08/2023).   Collins Dean, FNP-BC [facility] and [facility] [place], [place] [phone]   06/02/2023, 9:59 AM

## 2023-06-03 LAB — CMP14+EGFR
ALT: 14 IU/L (ref 0–44)
AST: 18 IU/L (ref 0–40)
Albumin: 4.5 g/dL (ref 3.8–4.8)
Alkaline Phosphatase: 89 IU/L (ref 44–121)
BUN/Creatinine Ratio: 19 (ref 10–24)
BUN: 13 mg/dL (ref 8–27)
Bilirubin Total: 0.3 mg/dL (ref 0.0–1.2)
CO2: 21 mmol/L (ref 20–29)
Calcium: 9.5 mg/dL (ref 8.6–10.2)
Chloride: 103 mmol/L (ref 96–106)
Creatinine, Ser: 0.67 mg/dL — ABNORMAL LOW (ref 0.76–1.27)
Globulin, Total: 3 g/dL (ref 1.5–4.5)
Glucose: 119 mg/dL — ABNORMAL HIGH (ref 70–99)
Potassium: 4 mmol/L (ref 3.5–5.2)
Sodium: 141 mmol/L (ref 134–144)
Total Protein: 7.5 g/dL (ref 6.0–8.5)
eGFR: 99 mL/min/{1.73_m2} (ref >59)

## 2023-06-05 IMAGING — CT CT CHEST W/ CM
3 of 8 series · 16 of 36 positions shown, 18 images · IV contrast (APPLIED)
Comparison: Chest radiographs today and 08/17/2012. Abdominal CT
11/01/2003

CLINICAL DATA: Chest pain for 3 days. History of diabetes and
hypertension. Abnormal chest x-ray.

EXAM:
CT CHEST WITH CONTRAST
TECHNIQUE: Multidetector CT imaging of the chest was performed during
intravenous contrast administration.

[Series 2: routine chest with · axial · 0.69mm/px · z∈[+1236,+1452]mm · 8 of 140 slices shown, 10 images]
[im 16/140  mediastinal]
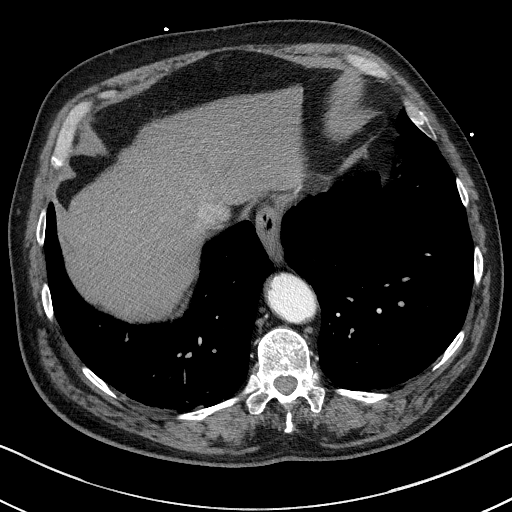
[im 16/140  lung]
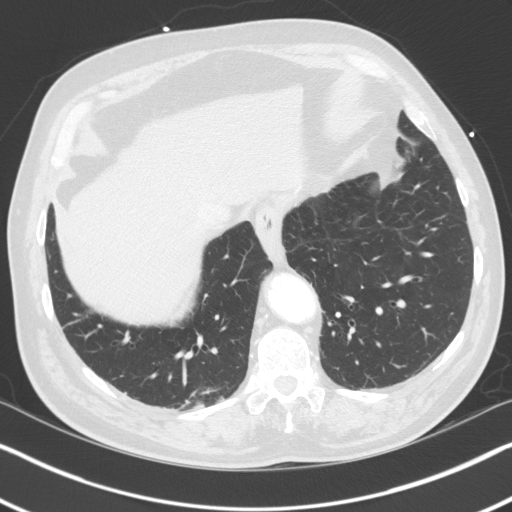
[im 31/140  lung]
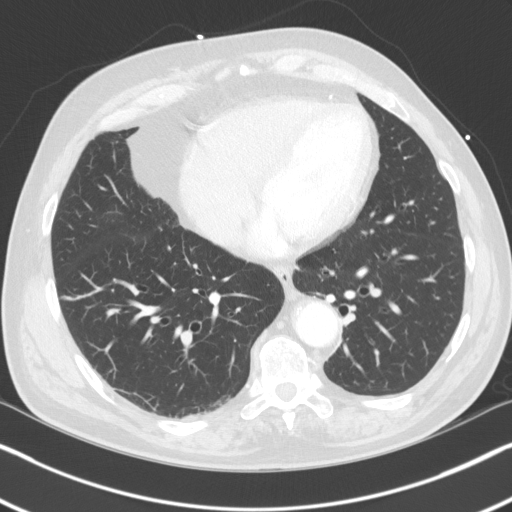
[im 47/140  lung]
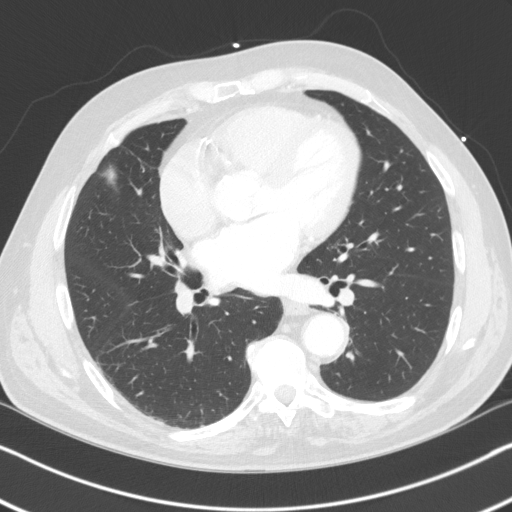
[im 62/140  lung]
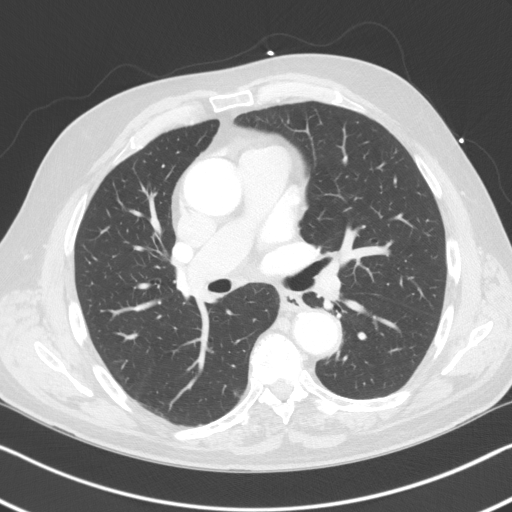
[im 78/140  mediastinal]
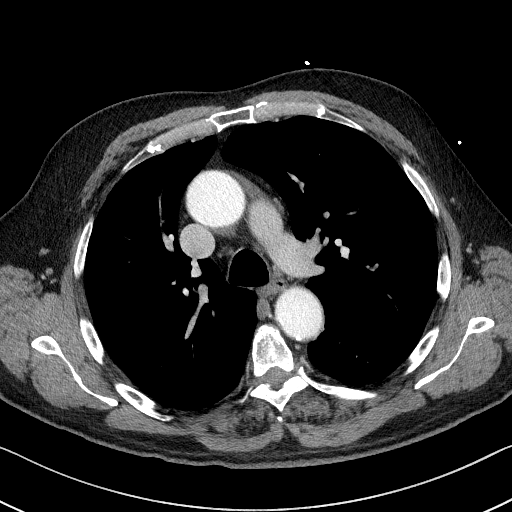
[im 78/140  lung]
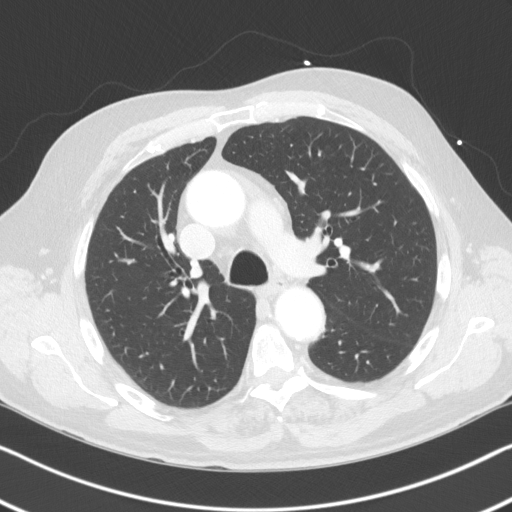
[im 93/140  lung]
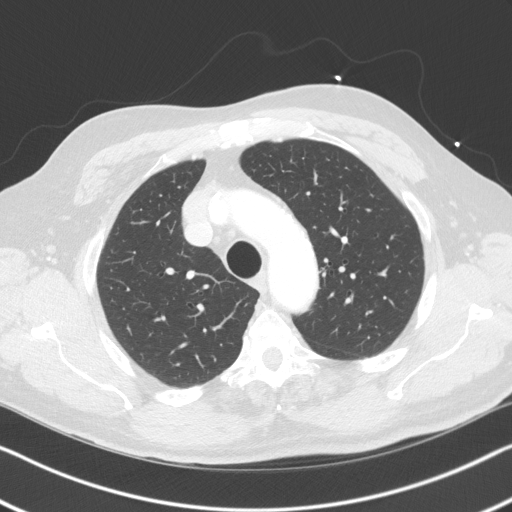
[im 109/140  lung]
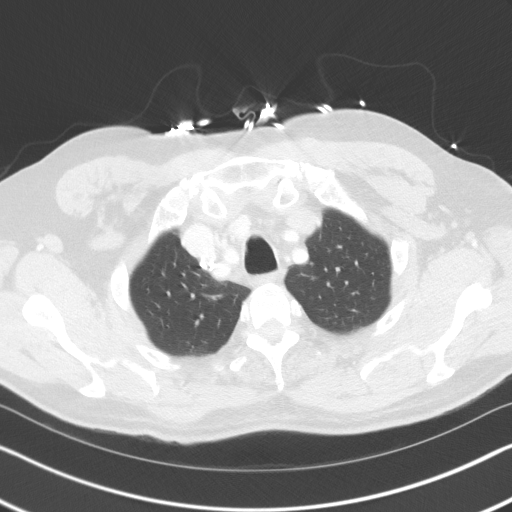
[im 124/140  lung]
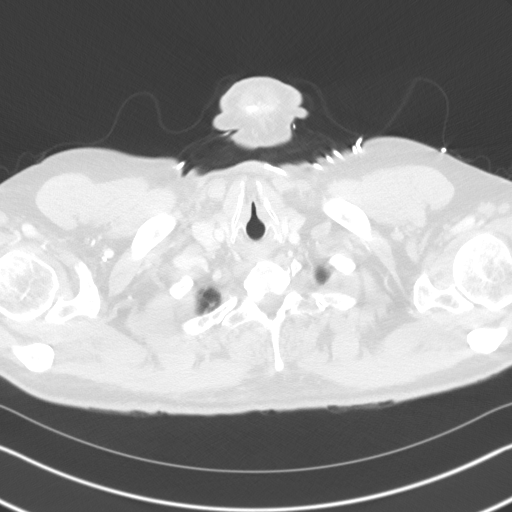

[Series 4: lungs · axial · 0.69mm/px · z∈[+1236,+1422]mm · 7 of 140 slices shown]
[im 16/140  lung]
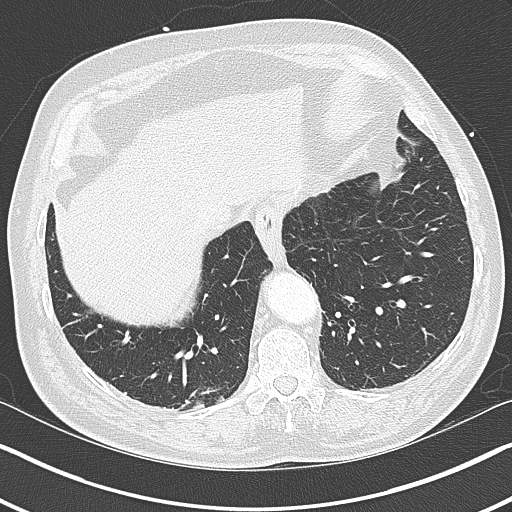
[im 31/140  lung]
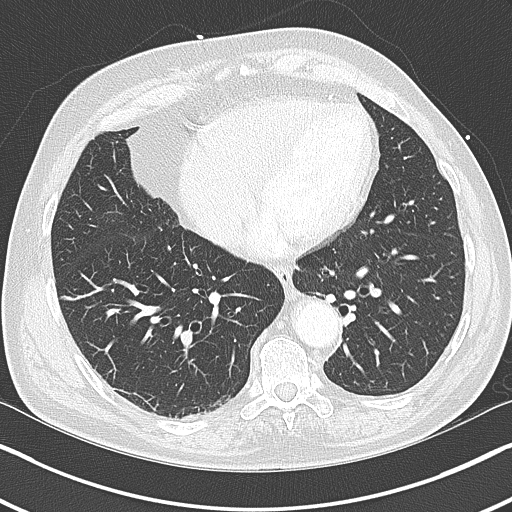
[im 47/140  lung]
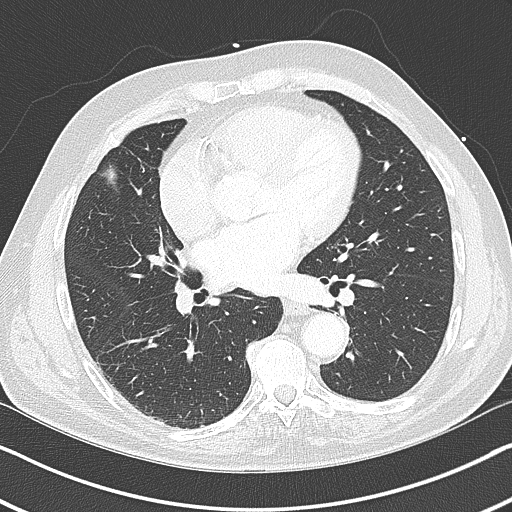
[im 62/140  lung]
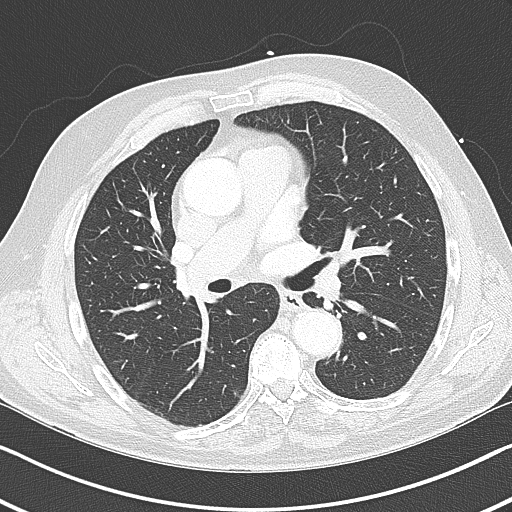
[im 78/140  lung]
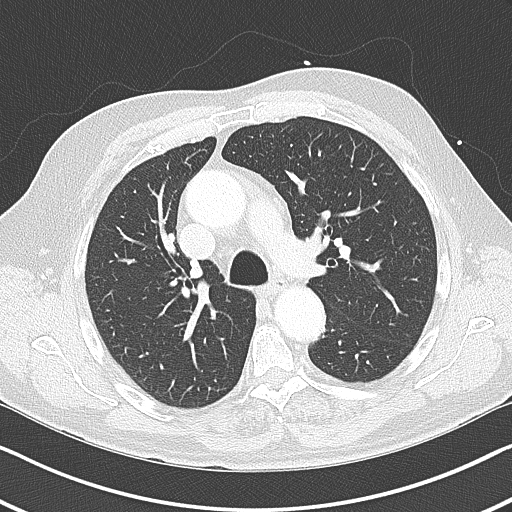
[im 93/140  lung]
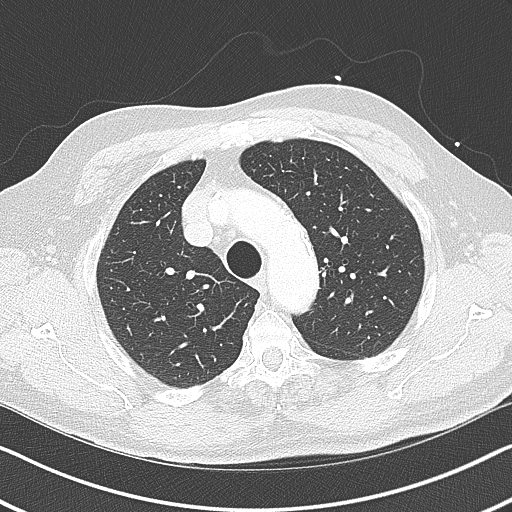
[im 109/140  lung]
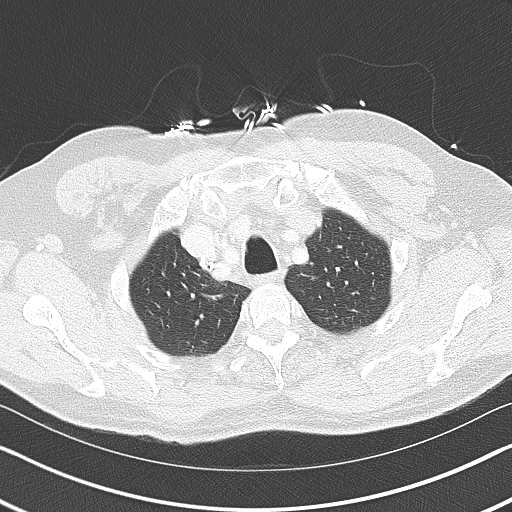

[Series 5: coronal · coronal · 0.54mm/px · 1 of 152 slices shown]
[im 76/152  lung]
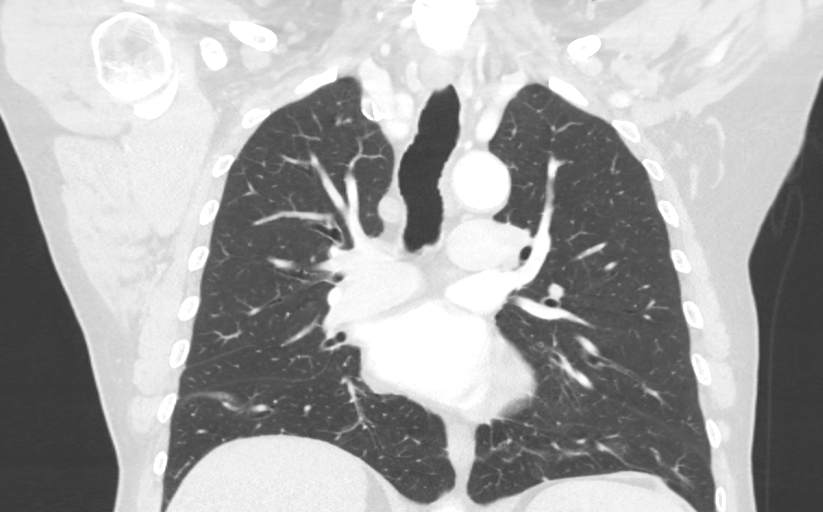

[16 of 36 positions shown; findings below may reference images not displayed]

RADIATION DOSE REDUCTION: This exam was performed according to the
departmental dose-optimization program which includes automated
exposure control, adjustment of the mA and/or kV according to
patient size and/or use of iterative reconstruction technique.

CONTRAST:  75mL OMNIPAQUE IOHEXOL 300 MG/ML  SOLN
FINDINGS: Cardiovascular: No acute vascular findings are seen. There is
moderate atherosclerosis of the aorta, great vessels and coronary
arteries. The heart size is normal. There is no pericardial
effusion.

Mediastinum/Nodes: There are no enlarged mediastinal, hilar or
axillary lymph nodes. The thyroid gland, trachea and esophagus
demonstrate no significant findings.

Lungs/Pleura: No pleural effusion or pneumothorax. Mild subpleural
reticulation and central airway thickening in both lungs with
atelectasis or scarring medially in the right middle lobe. There is
no airspace disease or suspicious pulmonary nodule.

Upper abdomen: No significant findings are seen within the
visualized upper abdomen.

Musculoskeletal/Chest wall: There is no chest wall mass or
suspicious osseous finding. Prominent paraspinal osteophytes on the
right at T[DATE] account for the earlier radiographic finding.
IMPRESSION: 1. No evidence of pulmonary or mediastinal mass. The radiographic
finding may be due to paraspinal osteophytes.
2. Scattered mild pulmonary scarring and central airway thickening.
No acute chest findings.
3. Coronary and Aortic Atherosclerosis ([id number]). No acute
vascular findings.

## 2023-06-07 ENCOUNTER — Other Ambulatory Visit: Payer: Self-pay | Admitting: Family Medicine

## 2023-06-07 DIAGNOSIS — E1169 Type 2 diabetes mellitus with other specified complication: Secondary | ICD-10-CM

## 2023-06-09 ENCOUNTER — Encounter: Payer: Self-pay | Admitting: Nurse Practitioner

## 2023-06-09 ENCOUNTER — Ambulatory Visit: Payer: Self-pay | Admitting: Nurse Practitioner

## 2023-06-09 ENCOUNTER — Other Ambulatory Visit: Payer: Self-pay | Admitting: Nurse Practitioner

## 2023-06-09 DIAGNOSIS — Z794 Long term (current) use of insulin: Secondary | ICD-10-CM

## 2023-06-09 MED ORDER — ONETOUCH VERIO VI STRP
ORAL_STRIP | 12 refills | Status: AC
Start: 2023-06-09 — End: ?

## 2023-06-15 DIAGNOSIS — F1120 Opioid dependence, uncomplicated: Secondary | ICD-10-CM | POA: Diagnosis not present

## 2023-06-23 DIAGNOSIS — H2513 Age-related nuclear cataract, bilateral: Secondary | ICD-10-CM | POA: Diagnosis not present

## 2023-06-23 DIAGNOSIS — H35372 Puckering of macula, left eye: Secondary | ICD-10-CM | POA: Diagnosis not present

## 2023-06-29 DIAGNOSIS — F1120 Opioid dependence, uncomplicated: Secondary | ICD-10-CM | POA: Diagnosis not present

## 2023-07-11 DIAGNOSIS — E1136 Type 2 diabetes mellitus with diabetic cataract: Secondary | ICD-10-CM | POA: Diagnosis not present

## 2023-07-11 DIAGNOSIS — E1140 Type 2 diabetes mellitus with diabetic neuropathy, unspecified: Secondary | ICD-10-CM | POA: Diagnosis not present

## 2023-07-11 DIAGNOSIS — H2512 Age-related nuclear cataract, left eye: Secondary | ICD-10-CM | POA: Diagnosis not present

## 2023-07-16 ENCOUNTER — Other Ambulatory Visit: Payer: Self-pay | Admitting: Nurse Practitioner

## 2023-07-16 DIAGNOSIS — E118 Type 2 diabetes mellitus with unspecified complications: Secondary | ICD-10-CM

## 2023-07-18 ENCOUNTER — Other Ambulatory Visit: Payer: Self-pay | Admitting: Nurse Practitioner

## 2023-07-18 DIAGNOSIS — F419 Anxiety disorder, unspecified: Secondary | ICD-10-CM

## 2023-07-25 DIAGNOSIS — E1140 Type 2 diabetes mellitus with diabetic neuropathy, unspecified: Secondary | ICD-10-CM | POA: Diagnosis not present

## 2023-07-25 DIAGNOSIS — E1136 Type 2 diabetes mellitus with diabetic cataract: Secondary | ICD-10-CM | POA: Diagnosis not present

## 2023-07-25 DIAGNOSIS — H2511 Age-related nuclear cataract, right eye: Secondary | ICD-10-CM | POA: Diagnosis not present

## 2023-08-13 ENCOUNTER — Other Ambulatory Visit: Payer: Self-pay | Admitting: Nurse Practitioner

## 2023-08-13 ENCOUNTER — Other Ambulatory Visit: Payer: Self-pay | Admitting: *Deleted

## 2023-08-13 DIAGNOSIS — I77810 Thoracic aortic ectasia: Secondary | ICD-10-CM

## 2023-08-13 DIAGNOSIS — F419 Anxiety disorder, unspecified: Secondary | ICD-10-CM

## 2023-08-27 ENCOUNTER — Other Ambulatory Visit: Payer: Self-pay | Admitting: Nurse Practitioner

## 2023-08-27 DIAGNOSIS — I2510 Atherosclerotic heart disease of native coronary artery without angina pectoris: Secondary | ICD-10-CM

## 2023-09-02 ENCOUNTER — Ambulatory Visit: Attending: Nurse Practitioner | Admitting: Nurse Practitioner

## 2023-09-02 ENCOUNTER — Encounter: Payer: Self-pay | Admitting: Nurse Practitioner

## 2023-09-02 VITALS — BP 125/79 | HR 79 | Resp 18 | Ht 71.0 in | Wt 187.2 lb

## 2023-09-02 DIAGNOSIS — I10 Essential (primary) hypertension: Secondary | ICD-10-CM | POA: Diagnosis not present

## 2023-09-02 DIAGNOSIS — F419 Anxiety disorder, unspecified: Secondary | ICD-10-CM

## 2023-09-02 MED ORDER — LOSARTAN POTASSIUM 100 MG PO TABS: 100.0000 mg | ORAL_TABLET | Freq: Every day | ORAL | 1 refills | Status: DC

## 2023-09-02 MED ORDER — BUSPIRONE HCL 30 MG PO TABS
30.0000 mg | ORAL_TABLET | Freq: Two times a day (BID) | ORAL | 6 refills | Status: AC
Start: 2023-09-02 — End: ?

## 2023-09-02 NOTE — Progress Notes (Signed)
 Assessment & Plan:  Phillip Paul was seen today for hypertension.  Diagnoses and all orders for this visit:  Primary hypertension -     losartan  (COZAAR ) 100 MG tablet; Take 1 tablet (100 mg total) by mouth daily. FOR BLOOD PRESSURE Continue all antihypertensives as prescribed.  Reminded to bring in blood pressure log for follow  up appointment.  RECOMMENDATIONS: DASH/Mediterranean Diets are healthier choices for HTN.    Anxiety -     busPIRone  (BUSPAR ) 30 MG tablet; Take 1 tablet (30 mg total) by mouth 2 (two) times daily. ANXIETY    Patient has been counseled on age-appropriate routine health concerns for screening and prevention. These are reviewed and up-to-date. Referrals have been placed accordingly. Immunizations are up-to-date or declined.    Subjective:   Chief Complaint  Patient presents with   Hypertension    Phillip Paul 73 y.o. male presents to office today for follow up to HTN  He has a past medical history of Abnormal EKG, Anxiety, Chest pain, DM2, diabetic peripheral neuropathy,  Hypertension, Kidney stones, and Racing heart beat.     He is drinking corn silk tea which is helping to relieve his neuropathy.    HTN Blood pressure is well controlled.  BP Readings from Last 3 Encounters:  09/02/23 125/79  06/02/23 (!) 143/75  03/04/23 132/84     He believes his anxiety is worsening due to the stressors in his life. Specifically in regard to his daughter and grandson    Review of Systems  Constitutional:  Negative for fever, malaise/fatigue and weight loss.  HENT: Negative.  Negative for nosebleeds.   Eyes: Negative.  Negative for blurred vision, double vision and photophobia.  Respiratory: Negative.  Negative for cough and shortness of breath.   Cardiovascular: Negative.  Negative for chest pain, palpitations and leg swelling.  Gastrointestinal: Negative.  Negative for heartburn, nausea and vomiting.  Musculoskeletal: Negative.   Negative for myalgias.  Neurological: Negative.  Negative for dizziness, focal weakness, seizures and headaches.  Psychiatric/Behavioral:  Negative for suicidal ideas. The patient is nervous/anxious.     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)    Depression 2011   Diabetes mellitus without complication (HCC)    Dizzy    Hyperlipidemia 2013   on medication   Hypertension    Kidney stones    Neuromuscular disorder (HCC) 2022   neuropathy   Racing heart beat     Past Surgical History:  Procedure Laterality Date   CYSTOSCOPY     LEFT URETEROSCOPIC STONE MANIPULATION AND REMOVAL; PLACEMENT OF  LEFT DOUBLE-J URETERAL STENT   CYSTOSCOPY W/ URETERAL STENT PLACEMENT     LEFT DOUBLE-J URETERAL STENT   FRACTURE SURGERY  2013   left  lower leg   HERNIA REPAIR  years ago   inguinal   ORIF ANKLE FRACTURE Left 08/17/2012   Procedure: OPEN REDUCTION INTERNAL FIXATION (ORIF) ANKLE FRACTURE/Left;  Surgeon: Norleen Armor, MD;  Location: [facility];  Service: Orthopedics;  Laterality: Left;   TIBIA IM NAIL INSERTION Left 08/17/2012   Procedure: INTRAMEDULLARY (IM) NAIL TIBIAL/Left;  Surgeon: Norleen Armor, MD;  Location: [facility];  Service: Orthopedics;  Laterality: Left;    Family History  Problem Relation Age of Onset   Hypertension Father        lived to 84   Other Brother        MVA   Kidney disease Sister    Other Brother         Stomach problems    Social History Reviewed with no changes to be made today.   Outpatient Medications Prior to Visit  Medication Sig Dispense Refill   Accu-Chek Softclix Lancets lancets Use to check blood sugar 3 times daily. E11.69 100 each 6   albuterol  (VENTOLIN  HFA) 108 (90 Base) MCG/ACT inhaler Inhale 2 puffs into the lungs every 6 (six) hours as needed for wheezing or shortness of breath. 8 g 0   amLODipine  (NORVASC ) 10 MG tablet Take 1 tablet (10 mg total) by mouth daily. 90 tablet 1   aspirin  EC 81 MG tablet Take 1 tablet (81 mg total) by mouth daily. Swallow whole. 90 tablet 3   Blood Glucose Monitoring Suppl (ONETOUCH VERIO FLEX SYSTEM) w/Device KIT USE AS DIRECTED THREE TIMES DAILY TO CHECK GLUCOSE 1 kit 0   cholecalciferol (VITAMIN D ) 1000 units tablet Take 1,000 Units by mouth daily.     COENZYME Q10 PO Take by mouth.     FARXIGA  10 MG TABS tablet Take 1 tablet (10 mg total) by mouth daily. 90 tablet 1   fish oil-omega-3 fatty acids 1000 MG capsule Take 1 g by mouth daily.     fluticasone  (FLONASE ) 50 MCG/ACT nasal spray Place 2 sprays into both nostrils daily. 48 g 1   glucose blood (ACCU-CHEK GUIDE) test strip Use to check blood sugar 3 times daily. E11.69 100 each 6   glucose blood (ONETOUCH VERIO) test strip Use as instructed. Check blood glucose level by fingerstick three times per day. 200 each 12   Insulin  Glargine (BASAGLAR  KWIKPEN) 100 UNIT/ML Inject 10 Units into the skin daily. 15 mL 2   Insulin  Pen Needle 31G X 5 MM MISC Use as instructed. 100 each 12   NON FORMULARY [facility]  Anti-fungal (nail)-#1     omeprazole  (PRILOSEC) 40 MG capsule Take 1 capsule (40 mg total) by mouth daily. 90 capsule 3   ondansetron  (ZOFRAN ) 4 MG tablet Take 1 tablet (4 mg total) by mouth every 8 (eight) hours as needed. 20 tablet 1   rosuvastatin  (CRESTOR ) 20 MG tablet Take 1 tablet by mouth once daily 90 tablet 1   Semaglutide , 2 MG/DOSE, (OZEMPIC , 2 MG/DOSE,) 8 MG/3ML SOPN  INJECT 2 MG  SUBCUTANEOUSLY AS DIRECTED ONCE A WEEK 9 mL 1   Spacer/Aero-Holding [employer] approved spacer. ICD 10 J42 1 Units 0   busPIRone  (BUSPAR ) 30 MG tablet Take 1 tablet by mouth twice daily 60 tablet 0   losartan  (COZAAR ) 100 MG tablet Take 1 tablet by mouth once daily 90 tablet 1   gabapentin  (NEURONTIN ) 600 MG tablet Take 1 tablet (600 mg total) by mouth 3 (three) times daily. (Patient not taking: Reported on 09/02/2023) 90 tablet 3  No facility-administered medications prior to visit.    Allergies  Allergen Reactions   Tylenol  [Acetaminophen ] Other (See Comments)    ELEVATED LFTs       Objective:    BP 125/79 (BP Location: Left Arm, Patient Position: Sitting, Cuff Size: Normal)   Pulse 79   Resp 18   Ht 5' 11 (1.803 m)   Wt 187 lb 3.2 oz (84.9 kg)   SpO2 94%   BMI 26.11 kg/m  Wt Readings from Last 3 Encounters:  09/02/23 187 lb 3.2 oz (84.9 kg)  06/02/23 195 lb 12.8 oz (88.8 kg)  03/04/23 207 lb 12.8 oz (94.3 kg)    Physical Exam Vitals and nursing note reviewed.  Constitutional:      Appearance: He is well-developed.  HENT:     Head: Normocephalic and atraumatic.  Cardiovascular:     Rate and Rhythm: Normal rate and regular rhythm.     Heart sounds: Normal heart sounds. No murmur heard.    No friction rub. No gallop.  Pulmonary:     Effort: Pulmonary effort is normal. No tachypnea or respiratory distress.     Breath sounds: Normal breath sounds. No decreased breath sounds, wheezing, rhonchi or rales.  Chest:     Chest wall: No tenderness.  Abdominal:     General: Bowel sounds are normal.     Palpations: Abdomen is soft.  Musculoskeletal:        General: Normal range of motion.     Cervical back: Normal range of motion.  Skin:    General: Skin is warm and dry.  Neurological:     Mental Status: He is alert and oriented to person, place, and time.     Coordination: Coordination normal.  Psychiatric:        Behavior:  Behavior normal. Behavior is cooperative.        Thought Content: Thought content normal.        Judgment: Judgment normal.          Patient has been counseled extensively about nutrition and exercise as well as the importance of adherence with medications and regular follow-up. The patient was given clear instructions to go to ER or return to medical center if symptoms don't improve, worsen or new problems develop. The patient verbalized understanding.   Follow-up: Return in about 3 months (around 12/03/2023).   Haze LELON Servant, FNP-BC [facility] and [facility] [place], [place] [phone]   09/22/2023, 12:32 AM

## 2023-09-10 ENCOUNTER — Ambulatory Visit (HOSPITAL_COMMUNITY)
Admission: RE | Admit: 2023-09-10 | Discharge: 2023-09-10 | Disposition: A | Discharge status: Home / Self Care | Source: Ambulatory Visit | Attending: Cardiology | Admitting: Cardiology

## 2023-09-10 DIAGNOSIS — E119 Type 2 diabetes mellitus without complications: Secondary | ICD-10-CM | POA: Diagnosis not present

## 2023-09-10 DIAGNOSIS — I10 Essential (primary) hypertension: Secondary | ICD-10-CM | POA: Diagnosis not present

## 2023-09-10 DIAGNOSIS — I77810 Thoracic aortic ectasia: Secondary | ICD-10-CM | POA: Diagnosis not present

## 2023-09-10 DIAGNOSIS — E785 Hyperlipidemia, unspecified: Secondary | ICD-10-CM | POA: Diagnosis not present

## 2023-09-10 DIAGNOSIS — R072 Precordial pain: Secondary | ICD-10-CM

## 2023-09-10 DIAGNOSIS — R079 Chest pain, unspecified: Secondary | ICD-10-CM | POA: Insufficient documentation

## 2023-09-10 DIAGNOSIS — I350 Nonrheumatic aortic (valve) stenosis: Secondary | ICD-10-CM | POA: Diagnosis not present

## 2023-09-10 DIAGNOSIS — I2510 Atherosclerotic heart disease of native coronary artery without angina pectoris: Secondary | ICD-10-CM | POA: Insufficient documentation

## 2023-09-11 ENCOUNTER — Ambulatory Visit (HOSPITAL_COMMUNITY): Payer: Self-pay | Admitting: Physician Assistant

## 2023-09-11 DIAGNOSIS — I351 Nonrheumatic aortic (valve) insufficiency: Secondary | ICD-10-CM

## 2023-09-11 DIAGNOSIS — I77810 Thoracic aortic ectasia: Secondary | ICD-10-CM

## 2023-09-11 LAB — ECHOCARDIOGRAM COMPLETE
AR max vel: 3.37 cm2
AV Area VTI: 3.3 cm2
AV Area mean vel: 3.32 cm2
AV Mean grad: 3 mmHg
AV Peak grad: 4.8 mmHg
Ao pk vel: 1.09 m/s
Area-P 1/2: 2.42 cm2
P 1/2 time: 890 ms
S' Lateral: 3.1 cm

## 2023-09-22 ENCOUNTER — Encounter: Payer: Self-pay | Admitting: Nurse Practitioner

## 2023-10-01 ENCOUNTER — Other Ambulatory Visit: Payer: Self-pay | Admitting: Nurse Practitioner

## 2023-10-01 DIAGNOSIS — K219 Gastro-esophageal reflux disease without esophagitis: Secondary | ICD-10-CM

## 2023-10-02 ENCOUNTER — Other Ambulatory Visit: Payer: Self-pay | Admitting: Nurse Practitioner

## 2023-10-02 DIAGNOSIS — K219 Gastro-esophageal reflux disease without esophagitis: Secondary | ICD-10-CM

## 2023-10-24 ENCOUNTER — Other Ambulatory Visit: Payer: Self-pay | Admitting: Nurse Practitioner

## 2023-10-24 DIAGNOSIS — E118 Type 2 diabetes mellitus with unspecified complications: Secondary | ICD-10-CM

## 2023-11-22 ENCOUNTER — Other Ambulatory Visit: Payer: Self-pay | Admitting: Nurse Practitioner

## 2023-11-22 ENCOUNTER — Other Ambulatory Visit: Payer: Self-pay | Admitting: Family Medicine

## 2023-11-22 DIAGNOSIS — Z794 Long term (current) use of insulin: Secondary | ICD-10-CM

## 2023-11-22 DIAGNOSIS — I10 Essential (primary) hypertension: Secondary | ICD-10-CM

## 2023-11-24 NOTE — Telephone Encounter (Signed)
 Requested Prescriptions  Pending Prescriptions Disp Refills   amLODipine  (NORVASC ) 10 MG tablet [Pharmacy Med Name: amLODIPine  Besylate 10 MG Oral Tablet] 90 tablet 0    Sig: Take 1 tablet by mouth once daily     Cardiovascular: Calcium  Channel Blockers 2 Passed - 11/24/2023  3:14 PM      Passed - Last BP in normal range    BP Readings from Last 1 Encounters:  09/02/23 125/79         Passed - Last Heart Rate in normal range    Pulse Readings from Last 1 Encounters:  09/02/23 79         Passed - Valid encounter within last 6 months    Recent Outpatient Visits           2 months ago Primary hypertension   [facility] [place]. [facility] [place], [place] W, NP   5 months ago Controlled type 2 diabetes mellitus with complication, with long-term current use of insulin  [facility])   [facility] [place]. [facility] [place], [place] W, NP   8 months ago Controlled type 2 diabetes mellitus with complication, with long-term current use of insulin  [facility])   [facility] [place]. [facility] Theotis Haze ORN, NP   12 months ago Diabetes mellitus treated with oral medication [facility])   [facility] [place]. [facility] Theotis Haze ORN, NP   1 year ago Primary hypertension   [facility] [place]. [facility] Theotis Haze ORN, NP       Future Appointments             In 1 week Theotis Haze ORN, NP [facility] [place]. [facility], [place]

## 2023-12-03 ENCOUNTER — Ambulatory Visit: Attending: Nurse Practitioner | Admitting: Nurse Practitioner

## 2023-12-03 ENCOUNTER — Encounter: Payer: Self-pay | Admitting: Nurse Practitioner

## 2023-12-03 ENCOUNTER — Other Ambulatory Visit: Payer: Self-pay | Admitting: Nurse Practitioner

## 2023-12-03 VITALS — BP 130/73 | HR 70 | Resp 18 | Ht 71.0 in | Wt 181.8 lb

## 2023-12-03 DIAGNOSIS — R3911 Hesitancy of micturition: Secondary | ICD-10-CM

## 2023-12-03 DIAGNOSIS — K219 Gastro-esophageal reflux disease without esophagitis: Secondary | ICD-10-CM | POA: Diagnosis not present

## 2023-12-03 DIAGNOSIS — I10 Essential (primary) hypertension: Secondary | ICD-10-CM

## 2023-12-03 DIAGNOSIS — N401 Enlarged prostate with lower urinary tract symptoms: Secondary | ICD-10-CM

## 2023-12-03 DIAGNOSIS — Z794 Long term (current) use of insulin: Secondary | ICD-10-CM

## 2023-12-03 DIAGNOSIS — E118 Type 2 diabetes mellitus with unspecified complications: Secondary | ICD-10-CM | POA: Diagnosis not present

## 2023-12-03 DIAGNOSIS — I2510 Atherosclerotic heart disease of native coronary artery without angina pectoris: Secondary | ICD-10-CM

## 2023-12-03 DIAGNOSIS — Z23 Encounter for immunization: Secondary | ICD-10-CM

## 2023-12-03 DIAGNOSIS — M79605 Pain in left leg: Secondary | ICD-10-CM | POA: Diagnosis not present

## 2023-12-03 MED ORDER — AMLODIPINE BESYLATE 10 MG PO TABS: 10.0000 mg | ORAL_TABLET | Freq: Every day | ORAL | 1 refills | Status: AC

## 2023-12-03 MED ORDER — BASAGLAR KWIKPEN 100 UNIT/ML ~~LOC~~ SOPN: 10.0000 [IU] | PEN_INJECTOR | Freq: Every day | SUBCUTANEOUS | 3 refills | Status: DC

## 2023-12-03 MED ORDER — OMEPRAZOLE 40 MG PO CPDR
40.0000 mg | DELAYED_RELEASE_CAPSULE | Freq: Every day | ORAL | 1 refills | Status: AC
Start: 2023-12-03 — End: ?

## 2023-12-03 MED ORDER — OZEMPIC (2 MG/DOSE) 8 MG/3ML ~~LOC~~ SOPN: 2.0000 mg | PEN_INJECTOR | SUBCUTANEOUS | 2 refills | Status: AC

## 2023-12-03 MED ORDER — TAMSULOSIN HCL 0.4 MG PO CAPS: 0.4000 mg | ORAL_CAPSULE | Freq: Every day | ORAL | 3 refills | Status: AC

## 2023-12-03 MED ORDER — TRAMADOL HCL 50 MG PO TABS: 50.0000 mg | ORAL_TABLET | Freq: Every day | ORAL | 0 refills | Status: AC | PRN

## 2023-12-03 NOTE — Progress Notes (Signed)
 Assessment & Plan:  Phillip Paul was seen today for hypertension.  Diagnoses and all orders for this visit:  Primary hypertension -     amLODipine  (NORVASC ) 10 MG tablet; Take 1 tablet (10 mg total) by mouth daily.  Benign prostatic hyperplasia with urinary hesitancy -     PSA -     tamsulosin (FLOMAX) 0.4 MG CAPS capsule; Take 1 capsule (0.4 mg total) by mouth daily. -     Urinalysis, Complete -     Ambulatory referral to Urology Urinary hesitancy and frequency. Discussed potential enlarged prostate causing symptoms. Prefers prescription medication. - Ordered urinalysis. - Ordered PSA test. - Referred to urology. - Prescribed tamsulosin.  Controlled type 2 diabetes mellitus with complication, with long-term current use of insulin  (HCC) -     Hemoglobin A1c -     CMP14+EGFR -     Urine Albumin/Creatinine with ratio (send out) [LAB689] -     Insulin  Glargine (BASAGLAR  KWIKPEN) 100 UNIT/ML; Inject 10 Units into the skin daily. -     Semaglutide , 2 MG/DOSE, (OZEMPIC , 2 MG/DOSE,) 8 MG/3ML SOPN; Inject 2 mg into the skin once a week. Issues with semaglutide  pens providing insufficient dosage. New batch received on November 10th, 2025, needs monitoring. - Monitor new semaglutide  pen for correct dosage. - Contact pharmacy if issues persist with new pen.  Left leg pain -     traMADol (ULTRAM) 50 MG tablet; Take 1 tablet (50 mg total) by mouth daily as needed. Chronic bilateral leg pain, previously managed with methadone and oxycodone . Tylenol  3 caused stomach upset. Discussed potential use of tramadol. - Prescribed tramadol for pain management.  Need for influenza vaccination -     Flu vaccine HIGH DOSE PF(Fluzone Trivalent)  GERD without esophagitis -     omeprazole  (PRILOSEC) 40 MG capsule; Take 1 capsule (40 mg total) by mouth daily. INSTRUCTIONS: Avoid GERD Triggers: acidic, spicy or fried foods, caffeine, coffee, sodas,  alcohol and chocolate.    Coronary artery  disease involving native coronary artery of native heart without angina pectoris -     Lipid panel INSTRUCTIONS: Work on a low fat, heart healthy diet and participate in regular aerobic exercise program by working out at least 150 minutes per week; 5 days a week-30 minutes per day. Avoid red meat/beef/steak,  fried foods. junk foods, sodas, sugary drinks, unhealthy snacking, alcohol and smoking.  Drink at least 80 oz of water per day and monitor your carbohydrate intake daily.      Patient has been counseled on age-appropriate routine health concerns for screening and prevention. These are reviewed and up-to-date. Referrals have been placed accordingly. Immunizations are up-to-date or declined.    Subjective:   Chief Complaint  Patient presents with   Hypertension    Phillip Paul 73 y.o. male presents to office today for HTN  He has a past medical history of Abnormal EKG, Anxiety, Chest pain, DM2, diabetic peripheral neuropathy,  Hypertension, Kidney stones, and Racing heart beat.      He is drinking corn silk tea which is helping to relieve his neuropathy.   HTN Blood pressure is well controlled. He is currently taking amlodipine  and losartan   BP Readings from Last 3 Encounters:  12/03/23 130/73  09/02/23 125/79  06/02/23 (!) 143/75    He is experiencing issues with his semaglutide  pen dosage. Currently on a 2 mg dose, each pen should provide four doses, but he is only able to get three doses. This has  been consistent across three pens, suggesting a possible issue with the pen batch. He has been in contact with the pharmacy regarding this issue. Lab Results  Component Value Date   HGBA1C 6.6 06/02/2023     BPH WITH LUTS He has been experiencing urinary hesitancy for the past couple of months. He describes having to urinate multiple times, especially at night, and notes hesitancy during urination. He has not been taking any medication for this issue.  He reports  bilateral leg pain L>R, which he associates with arthritis and previous use of a titanium rod in the lower left leg. He used to take methadone and oxycodone  for pain management but has since stopped. He experiences pain that wakes him up at night and requires the use of a walker. He has tried Tylenol  3 in the past, which upset his stomach.   Review of Systems  Constitutional:  Negative for fever, malaise/fatigue and weight loss.  HENT: Negative.  Negative for nosebleeds.   Eyes: Negative.  Negative for blurred vision, double vision and photophobia.  Respiratory: Negative.  Negative for cough and shortness of breath.   Cardiovascular: Negative.  Negative for chest pain, palpitations and leg swelling.  Gastrointestinal: Negative.  Negative for heartburn, nausea and vomiting.  Genitourinary:  Positive for frequency.       SEE HPI  Musculoskeletal:  Positive for joint pain. Negative for myalgias.  Neurological: Negative.  Negative for dizziness, focal weakness, seizures and headaches.  Psychiatric/Behavioral: Negative.  Negative for suicidal ideas.     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)    Depression 2011   Diabetes mellitus without complication (HCC)    Dizzy    Hyperlipidemia 2013   on medication   Hypertension    Kidney stones    Neuromuscular disorder (HCC) 2022   neuropathy   Racing heart beat     Past Surgical  History:  Procedure Laterality Date   CYSTOSCOPY     LEFT URETEROSCOPIC STONE MANIPULATION AND REMOVAL; PLACEMENT OF  LEFT DOUBLE-J URETERAL STENT   CYSTOSCOPY W/ URETERAL STENT PLACEMENT     LEFT DOUBLE-J URETERAL STENT   FRACTURE SURGERY  2013   left lower leg   HERNIA REPAIR  years ago   inguinal   ORIF ANKLE FRACTURE Left 08/17/2012   Procedure: OPEN REDUCTION INTERNAL FIXATION (ORIF) ANKLE FRACTURE/Left;  Surgeon: Norleen Armor, MD;  Location: [facility] OR;  Service: Orthopedics;  Laterality: Left;   TIBIA IM NAIL INSERTION Left 08/17/2012   Procedure: INTRAMEDULLARY (IM) NAIL TIBIAL/Left;  Surgeon: Norleen Armor, MD;  Location: [facility] OR;  Service: Orthopedics;  Laterality: Left;    Family History  Problem Relation Age of Onset   Hypertension Father        lived to 61  Other Brother        MVA   Kidney disease Sister    Other Brother        Stomach problems    Social History Reviewed with no changes to be made today.   Outpatient Medications Prior to Visit  Medication Sig Dispense Refill   Accu-Chek Softclix Lancets lancets Use to check blood sugar 3 times daily. E11.69 100 each 6   albuterol  (VENTOLIN  HFA) 108 (90 Base) MCG/ACT inhaler Inhale 2 puffs into the lungs every 6 (six) hours as needed for wheezing or shortness of breath. 8 g 0   aspirin  EC 81 MG tablet Take 1 tablet (81 mg total) by mouth daily. Swallow whole. 90 tablet 3   Blood Glucose Monitoring Suppl (ONETOUCH VERIO FLEX SYSTEM) w/Device KIT USE AS DIRECTED THREE TIMES DAILY TO CHECK GLUCOSE 1 kit 0   busPIRone  (BUSPAR ) 30 MG tablet Take 1 tablet (30 mg total) by mouth 2 (two) times daily. ANXIETY 60 tablet 6   cholecalciferol (VITAMIN D ) 1000 units tablet Take 1,000 Units by mouth daily.     COENZYME Q10 PO Take by mouth.     FARXIGA  10 MG TABS tablet Take 1 tablet (10 mg total) by mouth daily. 90 tablet 1   fish oil-omega-3 fatty acids 1000 MG capsule Take 1 g by mouth daily.     fluticasone  (FLONASE ) 50 MCG/ACT  nasal spray Place 2 sprays into both nostrils daily. 48 g 1   glucose blood (ACCU-CHEK GUIDE) test strip Use to check blood sugar 3 times daily. E11.69 100 each 6   glucose blood (ONETOUCH VERIO) test strip Use as instructed. Check blood glucose level by fingerstick three times per day. 200 each 12   Insulin  Pen Needle 31G X 5 MM MISC Use as instructed. 100 each 12   losartan  (COZAAR ) 100 MG tablet Take 1 tablet (100 mg total) by mouth daily. FOR BLOOD PRESSURE 90 tablet 1   NON FORMULARY [facility]  Anti-fungal (nail)-#1     ondansetron  (ZOFRAN ) 4 MG tablet Take 1 tablet (4 mg total) by mouth every 8 (eight) hours as needed. 20 tablet 1   rosuvastatin  (CRESTOR ) 20 MG tablet Take 1 tablet by mouth once daily 90 tablet 1   Spacer/Aero-Holding [employer] approved spacer. ICD 10 J42 1 Units 0   amLODipine  (NORVASC ) 10 MG tablet Take 1 tablet by mouth once daily 90 tablet 0   Insulin  Glargine (BASAGLAR  KWIKPEN) 100 UNIT/ML INJECT 10 UNITS SUBCUTANEOUSLY ONCE DAILY 9 mL 0   omeprazole  (PRILOSEC) 40 MG capsule Take 1 capsule by mouth once daily 90 capsule 0   OZEMPIC , 2 MG/DOSE, 8 MG/3ML SOPN INJECT 2MG  SUBCUTANEOUS AS DIRECTED ONCE A WEEK 9 mL 0   gabapentin  (NEURONTIN ) 600 MG tablet Take 1 tablet (600 mg total) by mouth 3 (three) times daily. (Patient not taking: Reported on 12/03/2023) 90 tablet 3   No facility-administered medications prior to visit.    Allergies  Allergen Reactions   Tylenol  [Acetaminophen ] Other (See Comments)    ELEVATED LFTs       Objective:    BP 130/73 (BP Location: Left Arm, Patient Position: Sitting, Cuff Size: Normal)   Pulse 70   Resp 18   Ht 5' 11 (1.803 m)   Wt 181 lb 12.8 oz (82.5 kg)   SpO2 98%   BMI 25.36 kg/m  Wt Readings from Last 3 Encounters:  12/03/23 181 lb 12.8 oz (82.5 kg)  09/02/23 187 lb  3.2 oz (84.9 kg)  06/02/23 195 lb 12.8 oz (88.8 kg)    Physical Exam Vitals and nursing note reviewed.   Constitutional:      Appearance: He is well-developed.  HENT:     Head: Normocephalic and atraumatic.  Cardiovascular:     Rate and Rhythm: Normal rate and regular rhythm.     Heart sounds: Normal heart sounds. No murmur heard.    No friction rub. No gallop.  Pulmonary:     Effort: Pulmonary effort is normal. No tachypnea or respiratory distress.     Breath sounds: Normal breath sounds. No decreased breath sounds, wheezing, rhonchi or rales.  Chest:     Chest wall: No tenderness.  Musculoskeletal:        General: Normal range of motion.     Cervical back: Normal range of motion.  Skin:    General: Skin is warm and dry.  Neurological:     Mental Status: He is alert and oriented to person, place, and time.     Coordination: Coordination normal.  Psychiatric:        Behavior: Behavior normal. Behavior is cooperative.        Thought Content: Thought content normal.        Judgment: Judgment normal.          Patient has been counseled extensively about nutrition and exercise as well as the importance of adherence with medications and regular follow-up. The patient was given clear instructions to go to ER or return to medical center if symptoms don't improve, worsen or new problems develop. The patient verbalized understanding.   Follow-up: Return in about 3 months (around 03/08/2024).   Haze LELON Servant, FNP-BC [facility] [place], [place] [phone]   12/03/2023, 12:18 PM

## 2023-12-03 NOTE — Patient Instructions (Signed)
 [facility] Address: [address] [place],  [place], [place] [address] Phone: [phone]

## 2023-12-05 LAB — URINALYSIS, COMPLETE
Bilirubin, UA: NEGATIVE
Ketones, UA: NEGATIVE
Leukocytes,UA: NEGATIVE
Nitrite, UA: NEGATIVE
RBC, UA: NEGATIVE
Specific Gravity, UA: >=1.03 — AB (ref 1.005–1.030)
Urobilinogen, Ur: 0.2 mg/dL (ref 0.2–1.0)
pH, UA: 7 (ref 5.0–7.5)

## 2023-12-05 LAB — CMP14+EGFR
ALT: 22 IU/L (ref 0–44)
AST: 22 IU/L (ref 0–40)
Albumin: 4.9 g/dL — ABNORMAL HIGH (ref 3.8–4.8)
Alkaline Phosphatase: 100 IU/L (ref 47–123)
BUN/Creatinine Ratio: 21 (ref 10–24)
BUN: 16 mg/dL (ref 8–27)
Bilirubin Total: 0.4 mg/dL (ref 0.0–1.2)
CO2: 21 mmol/L (ref 20–29)
Calcium: 10 mg/dL (ref 8.6–10.2)
Chloride: 99 mmol/L (ref 96–106)
Creatinine, Ser: 0.78 mg/dL (ref 0.76–1.27)
Globulin, Total: 3.1 g/dL (ref 1.5–4.5)
Glucose: 101 mg/dL — ABNORMAL HIGH (ref 70–99)
Potassium: 4 mmol/L (ref 3.5–5.2)
Sodium: 138 mmol/L (ref 134–144)
Total Protein: 8 g/dL (ref 6.0–8.5)
eGFR: 94 mL/min/1.73 (ref >59)

## 2023-12-05 LAB — MICROSCOPIC EXAMINATION
Bacteria, UA: NONE SEEN
Casts: NONE SEEN /LPF
Epithelial Cells (non renal): NONE SEEN /HPF (ref 0–10)
WBC, UA: NONE SEEN /HPF (ref 0–5)

## 2023-12-05 LAB — LIPID PANEL
Chol/HDL Ratio: 2.8 ratio (ref 0.0–5.0)
Cholesterol, Total: 131 mg/dL (ref 100–199)
HDL: 46 mg/dL
LDL Chol Calc (NIH): 63 mg/dL (ref 0–99)
Triglycerides: 123 mg/dL (ref 0–149)
VLDL Cholesterol Cal: 22 mg/dL (ref 5–40)

## 2023-12-05 LAB — HEMOGLOBIN A1C
Est. average glucose Bld gHb Est-mCnc: 128 mg/dL
Hgb A1c MFr Bld: 6.1 % — ABNORMAL HIGH (ref 4.8–5.6)

## 2023-12-05 LAB — PSA: Prostate Specific Ag, Serum: 0.7 ng/mL (ref 0.0–4.0)

## 2023-12-05 LAB — MICROALBUMIN / CREATININE URINE RATIO
Creatinine, Urine: 85.2 mg/dL
Microalb/Creat Ratio: 118 mg/g{creat} — ABNORMAL HIGH (ref 0–29)
Microalbumin, Urine: 100.3 ug/mL

## 2023-12-14 ENCOUNTER — Ambulatory Visit: Payer: Self-pay | Admitting: Nurse Practitioner

## 2023-12-25 ENCOUNTER — Other Ambulatory Visit: Payer: Self-pay | Admitting: Nurse Practitioner

## 2023-12-25 DIAGNOSIS — I10 Essential (primary) hypertension: Secondary | ICD-10-CM

## 2023-12-26 NOTE — Progress Notes (Signed)
 Phillip Paul                                          MRN: [id number]   12/26/2023   The [facility] Quality Team Specialist reviewed this patient medical record for the purposes of chart review for care gap closure. The following were reviewed: abstraction for care gap closure-kidney health evaluation for diabetes:eGFR  and uACR.    [facility] Quality Team

## 2024-02-12 ENCOUNTER — Encounter: Payer: Self-pay | Admitting: Nurse Practitioner

## 2024-02-13 ENCOUNTER — Other Ambulatory Visit: Payer: Self-pay | Admitting: Nurse Practitioner

## 2024-02-13 DIAGNOSIS — E118 Type 2 diabetes mellitus with unspecified complications: Secondary | ICD-10-CM

## 2024-02-13 MED ORDER — INSULIN GLARGINE 100 UNIT/ML SOLOSTAR PEN: 10.0000 [IU] | PEN_INJECTOR | Freq: Every day | SUBCUTANEOUS | 11 refills | Status: AC

## 2024-03-09 ENCOUNTER — Ambulatory Visit: Admitting: Nurse Practitioner
